# Patient Record
Sex: Male | Born: 1965 | Race: Black or African American | Hispanic: No | Marital: Married | Smoking: Never smoker
Health system: Southern US, Community
[De-identification: ages and names within clinical notes are randomized; demographics above are authoritative.]

## PROBLEM LIST (undated history)

## (undated) DIAGNOSIS — I10 Essential (primary) hypertension: Secondary | ICD-10-CM

## (undated) DIAGNOSIS — G510 Bell's palsy: Secondary | ICD-10-CM

## (undated) DIAGNOSIS — E785 Hyperlipidemia, unspecified: Secondary | ICD-10-CM

## (undated) DIAGNOSIS — T7840XA Allergy, unspecified, initial encounter: Secondary | ICD-10-CM

## (undated) DIAGNOSIS — G4730 Sleep apnea, unspecified: Secondary | ICD-10-CM

## (undated) HISTORY — DX: Sleep apnea, unspecified: G47.30

## (undated) HISTORY — PX: POLYPECTOMY: SHX149

## (undated) HISTORY — DX: Allergy, unspecified, initial encounter: T78.40XA

## (undated) HISTORY — PX: COLONOSCOPY: SHX174

## (undated) HISTORY — DX: Bell's palsy: G51.0

## (undated) HISTORY — DX: Hyperlipidemia, unspecified: E78.5

## (undated) HISTORY — DX: Essential (primary) hypertension: I10

## (undated) HISTORY — PX: KNEE SURGERY: SHX244

---

## 1998-05-12 ENCOUNTER — Emergency Department (HOSPITAL_COMMUNITY): Admission: EM | Admit: 1998-05-12 | Discharge: 1998-05-12 | Payer: Self-pay | Admitting: Emergency Medicine

## 1999-08-13 ENCOUNTER — Emergency Department (HOSPITAL_COMMUNITY): Admission: EM | Admit: 1999-08-13 | Discharge: 1999-08-13 | Payer: Self-pay | Admitting: Emergency Medicine

## 1999-08-13 ENCOUNTER — Encounter: Payer: Self-pay | Admitting: Emergency Medicine

## 2004-10-27 ENCOUNTER — Emergency Department (HOSPITAL_COMMUNITY): Admission: EM | Admit: 2004-10-27 | Discharge: 2004-10-27 | Payer: Self-pay | Admitting: Family Medicine

## 2005-06-14 ENCOUNTER — Encounter: Admission: RE | Admit: 2005-06-14 | Discharge: 2005-06-14 | Payer: Self-pay | Admitting: Family Medicine

## 2006-07-11 ENCOUNTER — Ambulatory Visit: Payer: Self-pay | Admitting: Family Medicine

## 2006-07-30 ENCOUNTER — Ambulatory Visit: Payer: Self-pay | Admitting: Family Medicine

## 2006-08-15 ENCOUNTER — Ambulatory Visit: Payer: Self-pay | Admitting: Family Medicine

## 2006-08-15 LAB — CONVERTED CEMR LAB
ALT: 21 units/L (ref 0–40)
AST: 24 units/L (ref 0–37)
Albumin: 3.9 g/dL (ref 3.5–5.2)
Alkaline Phosphatase: 51 units/L (ref 39–117)
BUN: 16 mg/dL (ref 6–23)
Basophils Absolute: 0 10*3/uL (ref 0.0–0.1)
Basophils Relative: 1 % (ref 0.0–1.0)
CO2: 32 meq/L (ref 19–32)
Calcium: 9.2 mg/dL (ref 8.4–10.5)
Chloride: 106 meq/L (ref 96–112)
Chol/HDL Ratio, serum: 4.1
Cholesterol: 168 mg/dL (ref 0–200)
Creatinine, Ser: 1.4 mg/dL (ref 0.4–1.5)
Eosinophil percent: 5 % (ref 0.0–5.0)
GFR calc non Af Amer: 59 mL/min
Glomerular Filtration Rate, Af Am: 72 mL/min/{1.73_m2}
Glucose, Bld: 90 mg/dL (ref 70–99)
HCT: 39 % (ref 39.0–52.0)
HDL: 41.2 mg/dL (ref >39.0)
Hemoglobin: 13.2 g/dL (ref 13.0–17.0)
LDL Cholesterol: 115 mg/dL — ABNORMAL HIGH (ref 0–99)
Lymphocytes Relative: 40.9 % (ref 12.0–46.0)
MCHC: 33.8 g/dL (ref 30.0–36.0)
MCV: 90.6 fL (ref 78.0–100.0)
Monocytes Absolute: 0.3 10*3/uL (ref 0.2–0.7)
Monocytes Relative: 8.1 % (ref 3.0–11.0)
Neutro Abs: 1.8 10*3/uL (ref 1.4–7.7)
Neutrophils Relative %: 45 % (ref 43.0–77.0)
PSA: 1.33 ng/mL (ref 0.10–4.00)
Platelets: 244 10*3/uL (ref 150–400)
Potassium: 3.9 meq/L (ref 3.5–5.1)
RBC: 4.31 M/uL (ref 4.22–5.81)
RDW: 12.4 % (ref 11.5–14.6)
Sodium: 142 meq/L (ref 135–145)
TSH: 1.32 microintl units/mL (ref 0.35–5.50)
Total Bilirubin: 0.8 mg/dL (ref 0.3–1.2)
Total Protein: 7.5 g/dL (ref 6.0–8.3)
Triglyceride fasting, serum: 59 mg/dL (ref 0–149)
VLDL: 12 mg/dL (ref 0–40)
WBC: 3.9 10*3/uL — ABNORMAL LOW (ref 4.5–10.5)

## 2006-11-12 ENCOUNTER — Ambulatory Visit: Payer: Self-pay | Admitting: Family Medicine

## 2006-11-12 LAB — CONVERTED CEMR LAB
ALT: 22 units/L (ref 0–40)
AST: 39 units/L — ABNORMAL HIGH (ref 0–37)
Albumin: 4.1 g/dL (ref 3.5–5.2)
Alkaline Phosphatase: 61 units/L (ref 39–117)
BUN: 14 mg/dL (ref 6–23)
Basophils Absolute: 0 10*3/uL (ref 0.0–0.1)
Basophils Relative: 0.6 % (ref 0.0–1.0)
Bilirubin, Direct: 0.1 mg/dL (ref 0.0–0.3)
CO2: 31 meq/L (ref 19–32)
Calcium: 9.4 mg/dL (ref 8.4–10.5)
Chloride: 106 meq/L (ref 96–112)
Cholesterol: 165 mg/dL (ref 0–200)
Creatinine, Ser: 1.3 mg/dL (ref 0.4–1.5)
Eosinophils Absolute: 0.2 10*3/uL (ref 0.0–0.6)
Eosinophils Relative: 5.9 % — ABNORMAL HIGH (ref 0.0–5.0)
GFR calc Af Amer: 78 mL/min
GFR calc non Af Amer: 65 mL/min
Glucose, Bld: 84 mg/dL (ref 70–99)
HCT: 38 % — ABNORMAL LOW (ref 39.0–52.0)
HDL: 45.4 mg/dL (ref >39.0)
Hemoglobin: 13.4 g/dL (ref 13.0–17.0)
LDL Cholesterol: 106 mg/dL — ABNORMAL HIGH (ref 0–99)
Lymphocytes Relative: 33.9 % (ref 12.0–46.0)
MCHC: 35.3 g/dL (ref 30.0–36.0)
MCV: 88.8 fL (ref 78.0–100.0)
Monocytes Absolute: 0.4 10*3/uL (ref 0.2–0.7)
Monocytes Relative: 9.4 % (ref 3.0–11.0)
Neutro Abs: 2 10*3/uL (ref 1.4–7.7)
Neutrophils Relative %: 50.2 % (ref 43.0–77.0)
PSA: 1.46 ng/mL (ref 0.10–4.00)
Platelets: 238 10*3/uL (ref 150–400)
Potassium: 3.7 meq/L (ref 3.5–5.1)
RBC: 4.29 M/uL (ref 4.22–5.81)
RDW: 12.1 % (ref 11.5–14.6)
Sodium: 142 meq/L (ref 135–145)
TSH: 1.08 microintl units/mL (ref 0.35–5.50)
Total Bilirubin: 0.9 mg/dL (ref 0.3–1.2)
Total CHOL/HDL Ratio: 3.6
Total Protein: 7.8 g/dL (ref 6.0–8.3)
Triglycerides: 67 mg/dL (ref 0–149)
VLDL: 13 mg/dL (ref 0–40)
WBC: 3.9 10*3/uL — ABNORMAL LOW (ref 4.5–10.5)

## 2007-02-25 DIAGNOSIS — I10 Essential (primary) hypertension: Secondary | ICD-10-CM | POA: Insufficient documentation

## 2007-06-18 ENCOUNTER — Ambulatory Visit: Payer: Self-pay | Admitting: Family Medicine

## 2007-06-18 DIAGNOSIS — L0390 Cellulitis, unspecified: Secondary | ICD-10-CM

## 2007-06-18 DIAGNOSIS — L0291 Cutaneous abscess, unspecified: Secondary | ICD-10-CM

## 2007-06-23 ENCOUNTER — Ambulatory Visit: Payer: Self-pay | Admitting: Family Medicine

## 2007-09-17 ENCOUNTER — Ambulatory Visit: Payer: Self-pay | Admitting: Family Medicine

## 2007-11-07 ENCOUNTER — Ambulatory Visit: Payer: Self-pay | Admitting: Family Medicine

## 2007-11-18 LAB — CONVERTED CEMR LAB
ALT: 20 units/L (ref 0–53)
AST: 24 units/L (ref 0–37)
Albumin: 4.1 g/dL (ref 3.5–5.2)
Alkaline Phosphatase: 59 units/L (ref 39–117)
BUN: 13 mg/dL (ref 6–23)
Bilirubin, Direct: 0.1 mg/dL (ref 0.0–0.3)
CO2: 31 meq/L (ref 19–32)
Calcium: 9.3 mg/dL (ref 8.4–10.5)
Chloride: 104 meq/L (ref 96–112)
Cholesterol: 172 mg/dL (ref 0–200)
Creatinine, Ser: 1.1 mg/dL (ref 0.4–1.5)
GFR calc Af Amer: 94 mL/min
GFR calc non Af Amer: 78 mL/min
Glucose, Bld: 92 mg/dL (ref 70–99)
HDL: 41.3 mg/dL (ref >39.0)
LDL Cholesterol: 119 mg/dL — ABNORMAL HIGH (ref 0–99)
Potassium: 3.8 meq/L (ref 3.5–5.1)
Sodium: 141 meq/L (ref 135–145)
Total Bilirubin: 0.8 mg/dL (ref 0.3–1.2)
Total CHOL/HDL Ratio: 4.2
Total Protein: 7.8 g/dL (ref 6.0–8.3)
Triglycerides: 58 mg/dL (ref 0–149)
VLDL: 12 mg/dL (ref 0–40)

## 2008-02-16 ENCOUNTER — Ambulatory Visit: Payer: Self-pay | Admitting: Family Medicine

## 2008-02-23 ENCOUNTER — Encounter (INDEPENDENT_AMBULATORY_CARE_PROVIDER_SITE_OTHER): Payer: Self-pay | Admitting: *Deleted

## 2008-03-22 ENCOUNTER — Ambulatory Visit: Payer: Self-pay | Admitting: Family Medicine

## 2008-03-22 ENCOUNTER — Encounter (INDEPENDENT_AMBULATORY_CARE_PROVIDER_SITE_OTHER): Payer: Self-pay | Admitting: *Deleted

## 2008-03-22 DIAGNOSIS — E785 Hyperlipidemia, unspecified: Secondary | ICD-10-CM

## 2008-06-15 ENCOUNTER — Telehealth (INDEPENDENT_AMBULATORY_CARE_PROVIDER_SITE_OTHER): Payer: Self-pay | Admitting: *Deleted

## 2008-06-22 ENCOUNTER — Telehealth (INDEPENDENT_AMBULATORY_CARE_PROVIDER_SITE_OTHER): Payer: Self-pay | Admitting: *Deleted

## 2008-06-22 ENCOUNTER — Ambulatory Visit: Payer: Self-pay | Admitting: Family Medicine

## 2008-06-22 DIAGNOSIS — L03319 Cellulitis of trunk, unspecified: Secondary | ICD-10-CM

## 2008-06-22 DIAGNOSIS — L02219 Cutaneous abscess of trunk, unspecified: Secondary | ICD-10-CM

## 2008-06-25 ENCOUNTER — Telehealth: Payer: Self-pay | Admitting: Family Medicine

## 2008-06-28 ENCOUNTER — Ambulatory Visit: Payer: Self-pay | Admitting: Family Medicine

## 2008-06-28 ENCOUNTER — Encounter: Payer: Self-pay | Admitting: Family Medicine

## 2008-06-29 ENCOUNTER — Telehealth: Payer: Self-pay | Admitting: Family Medicine

## 2008-06-29 ENCOUNTER — Encounter: Payer: Self-pay | Admitting: Family Medicine

## 2008-06-30 ENCOUNTER — Ambulatory Visit: Payer: Self-pay | Admitting: Family Medicine

## 2008-07-05 ENCOUNTER — Ambulatory Visit: Payer: Self-pay | Admitting: Family Medicine

## 2008-07-09 ENCOUNTER — Ambulatory Visit: Payer: Self-pay | Admitting: Family Medicine

## 2008-07-12 ENCOUNTER — Telehealth: Payer: Self-pay | Admitting: Family Medicine

## 2008-09-29 ENCOUNTER — Telehealth: Payer: Self-pay | Admitting: Internal Medicine

## 2009-02-15 ENCOUNTER — Telehealth: Payer: Self-pay | Admitting: Family Medicine

## 2009-06-27 ENCOUNTER — Ambulatory Visit: Payer: Self-pay | Admitting: Family Medicine

## 2009-07-04 ENCOUNTER — Encounter: Payer: Self-pay | Admitting: Family Medicine

## 2009-07-19 ENCOUNTER — Ambulatory Visit: Payer: Self-pay | Admitting: Family Medicine

## 2009-07-19 LAB — CONVERTED CEMR LAB
OCCULT 1: NEGATIVE
OCCULT 2: NEGATIVE
OCCULT 3: NEGATIVE

## 2009-10-22 ENCOUNTER — Encounter: Payer: Self-pay | Admitting: Family Medicine

## 2009-10-25 ENCOUNTER — Ambulatory Visit: Payer: Self-pay | Admitting: Family Medicine

## 2009-10-25 DIAGNOSIS — M25519 Pain in unspecified shoulder: Secondary | ICD-10-CM | POA: Insufficient documentation

## 2009-11-08 ENCOUNTER — Encounter: Admission: RE | Admit: 2009-11-08 | Discharge: 2009-11-29 | Payer: Self-pay | Admitting: Family Medicine

## 2009-11-11 ENCOUNTER — Encounter: Payer: Self-pay | Admitting: Family Medicine

## 2009-12-05 ENCOUNTER — Ambulatory Visit: Payer: Self-pay | Admitting: Family Medicine

## 2009-12-05 DIAGNOSIS — M25569 Pain in unspecified knee: Secondary | ICD-10-CM

## 2009-12-05 DIAGNOSIS — M25561 Pain in right knee: Secondary | ICD-10-CM | POA: Insufficient documentation

## 2010-01-02 ENCOUNTER — Ambulatory Visit: Payer: Self-pay | Admitting: Family Medicine

## 2010-02-16 ENCOUNTER — Ambulatory Visit (HOSPITAL_BASED_OUTPATIENT_CLINIC_OR_DEPARTMENT_OTHER): Admission: RE | Admit: 2010-02-16 | Discharge: 2010-02-16 | Payer: Self-pay | Admitting: Orthopedic Surgery

## 2010-02-21 ENCOUNTER — Telehealth: Payer: Self-pay | Admitting: Family Medicine

## 2010-02-24 ENCOUNTER — Telehealth (INDEPENDENT_AMBULATORY_CARE_PROVIDER_SITE_OTHER): Payer: Self-pay | Admitting: *Deleted

## 2010-03-13 ENCOUNTER — Ambulatory Visit: Payer: Self-pay | Admitting: Family Medicine

## 2010-03-14 LAB — CONVERTED CEMR LAB
ALT: 33 units/L (ref 0–53)
AST: 29 units/L (ref 0–37)
Albumin: 4.3 g/dL (ref 3.5–5.2)
Alkaline Phosphatase: 68 units/L (ref 39–117)
HDL: 48.3 mg/dL (ref >39.00)
Total Bilirubin: 0.5 mg/dL (ref 0.3–1.2)
Total CHOL/HDL Ratio: 3
Triglycerides: 75 mg/dL (ref 0.0–149.0)

## 2010-08-01 ENCOUNTER — Telehealth: Payer: Self-pay | Admitting: Family Medicine

## 2010-09-04 ENCOUNTER — Ambulatory Visit: Payer: Self-pay | Admitting: Family Medicine

## 2010-09-04 ENCOUNTER — Telehealth (INDEPENDENT_AMBULATORY_CARE_PROVIDER_SITE_OTHER): Payer: Self-pay | Admitting: *Deleted

## 2010-09-04 DIAGNOSIS — J019 Acute sinusitis, unspecified: Secondary | ICD-10-CM

## 2010-09-05 ENCOUNTER — Ambulatory Visit: Payer: Self-pay | Admitting: Family Medicine

## 2010-09-06 LAB — CONVERTED CEMR LAB
ALT: 21 units/L (ref 0–53)
AST: 23 units/L (ref 0–37)
Albumin: 4.2 g/dL (ref 3.5–5.2)
CO2: 31 meq/L (ref 19–32)
Calcium: 9.3 mg/dL (ref 8.4–10.5)
GFR calc non Af Amer: 86.4 mL/min (ref >60)
LDL Cholesterol: 83 mg/dL (ref 0–99)
Sodium: 138 meq/L (ref 135–145)
Total Protein: 7.7 g/dL (ref 6.0–8.3)
VLDL: 12.8 mg/dL (ref 0.0–40.0)

## 2010-11-05 LAB — CONVERTED CEMR LAB
ALT: 19 units/L (ref 0–53)
ALT: 22 units/L (ref 0–53)
ALT: 22 units/L (ref 0–53)
AST: 22 units/L (ref 0–37)
AST: 26 units/L (ref 0–37)
AST: 27 units/L (ref 0–37)
Albumin: 4.1 g/dL (ref 3.5–5.2)
Albumin: 4.1 g/dL (ref 3.5–5.2)
Albumin: 4.2 g/dL (ref 3.5–5.2)
Alkaline Phosphatase: 53 units/L (ref 39–117)
Alkaline Phosphatase: 62 units/L (ref 39–117)
Alkaline Phosphatase: 70 units/L (ref 39–117)
BUN: 13 mg/dL (ref 6–23)
BUN: 13 mg/dL (ref 6–23)
BUN: 14 mg/dL (ref 6–23)
Basophils Absolute: 0 10*3/uL (ref 0.0–0.1)
Basophils Absolute: 0 10*3/uL (ref 0.0–0.1)
Basophils Relative: 0.6 % (ref 0.0–3.0)
Basophils Relative: 1.3 % — ABNORMAL HIGH (ref 0.0–1.0)
Bilirubin Urine: NEGATIVE
Bilirubin, Direct: 0 mg/dL (ref 0.0–0.3)
Bilirubin, Direct: 0.1 mg/dL (ref 0.0–0.3)
Bilirubin, Direct: 0.1 mg/dL (ref 0.0–0.3)
Blood in Urine, dipstick: NEGATIVE
CO2: 31 meq/L (ref 19–32)
CO2: 32 meq/L (ref 19–32)
CO2: 32 meq/L (ref 19–32)
CRP, High Sensitivity: <1 — ABNORMAL LOW (ref 0.00–5.00)
Calcium: 9.1 mg/dL (ref 8.4–10.5)
Calcium: 9.2 mg/dL (ref 8.4–10.5)
Calcium: 9.3 mg/dL (ref 8.4–10.5)
Chloride: 105 meq/L (ref 96–112)
Chloride: 105 meq/L (ref 96–112)
Chloride: 108 meq/L (ref 96–112)
Cholesterol: 122 mg/dL (ref 0–200)
Cholesterol: 137 mg/dL (ref 0–200)
Creatinine, Ser: 1.2 mg/dL (ref 0.4–1.5)
Creatinine, Ser: 1.2 mg/dL (ref 0.4–1.5)
Creatinine, Ser: 1.2 mg/dL (ref 0.4–1.5)
Eosinophils Absolute: 0.2 10*3/uL (ref 0.0–0.7)
Eosinophils Absolute: 0.2 10*3/uL (ref 0.0–0.7)
Eosinophils Relative: 5.6 % — ABNORMAL HIGH (ref 0.0–5.0)
Eosinophils Relative: 7.1 % — ABNORMAL HIGH (ref 0.0–5.0)
GFR calc Af Amer: 85 mL/min
GFR calc Af Amer: 85 mL/min
GFR calc non Af Amer: 70 mL/min
GFR calc non Af Amer: 70 mL/min
GFR calc non Af Amer: 84.37 mL/min (ref >60)
Glucose, Bld: 87 mg/dL (ref 70–99)
Glucose, Bld: 91 mg/dL (ref 70–99)
Glucose, Bld: 92 mg/dL (ref 70–99)
Glucose, Urine, Semiquant: NEGATIVE
HCT: 40.9 % (ref 39.0–52.0)
HCT: 42 % (ref 39.0–52.0)
HDL: 40.6 mg/dL (ref >39.0)
HDL: 41.5 mg/dL (ref >39.00)
Hemoglobin: 13.7 g/dL (ref 13.0–17.0)
Hemoglobin: 14.2 g/dL (ref 13.0–17.0)
Ketones, urine, test strip: NEGATIVE
LDL Cholesterol: 68 mg/dL (ref 0–99)
LDL Cholesterol: 82 mg/dL (ref 0–99)
Lymphocytes Relative: 42.9 % (ref 12.0–46.0)
Lymphocytes Relative: 45.6 % (ref 12.0–46.0)
Lymphs Abs: 1.8 10*3/uL (ref 0.7–4.0)
MCHC: 33.6 g/dL (ref 30.0–36.0)
MCHC: 33.9 g/dL (ref 30.0–36.0)
MCV: 90.5 fL (ref 78.0–100.0)
MCV: 91.1 fL (ref 78.0–100.0)
Monocytes Absolute: 0.3 10*3/uL (ref 0.1–1.0)
Monocytes Absolute: 0.4 10*3/uL (ref 0.1–1.0)
Monocytes Relative: 10.1 % (ref 3.0–12.0)
Monocytes Relative: 8.9 % (ref 3.0–12.0)
Neutro Abs: 1.2 10*3/uL — ABNORMAL LOW (ref 1.4–7.7)
Neutro Abs: 1.6 10*3/uL (ref 1.4–7.7)
Neutrophils Relative %: 37.1 % — ABNORMAL LOW (ref 43.0–77.0)
Neutrophils Relative %: 40.8 % — ABNORMAL LOW (ref 43.0–77.0)
Nitrite: NEGATIVE
PSA: 1.44 ng/mL (ref 0.10–4.00)
Platelets: 226 10*3/uL (ref 150–400)
Platelets: 227 10*3/uL (ref 150.0–400.0)
Potassium: 3.8 meq/L (ref 3.5–5.1)
Potassium: 4 meq/L (ref 3.5–5.1)
Potassium: 4.2 meq/L (ref 3.5–5.1)
Protein, U semiquant: NEGATIVE
RBC: 4.49 M/uL (ref 4.22–5.81)
RBC: 4.64 M/uL (ref 4.22–5.81)
RDW: 11.6 % (ref 11.5–14.6)
RDW: 12 % (ref 11.5–14.6)
Sodium: 141 meq/L (ref 135–145)
Sodium: 141 meq/L (ref 135–145)
Sodium: 142 meq/L (ref 135–145)
Specific Gravity, Urine: <1.005
TSH: 0.97 microintl units/mL (ref 0.35–5.50)
TSH: 1.11 microintl units/mL (ref 0.35–5.50)
Total Bilirubin: 0.8 mg/dL (ref 0.3–1.2)
Total Bilirubin: 0.9 mg/dL (ref 0.3–1.2)
Total Bilirubin: 1 mg/dL (ref 0.3–1.2)
Total CHOL/HDL Ratio: 3
Total CHOL/HDL Ratio: 3
Total Protein: 7.9 g/dL (ref 6.0–8.3)
Total Protein: 8.1 g/dL (ref 6.0–8.3)
Total Protein: 8.1 g/dL (ref 6.0–8.3)
Triglycerides: 67 mg/dL (ref 0.0–149.0)
Triglycerides: 67 mg/dL (ref 0–149)
Urobilinogen, UA: NEGATIVE
VLDL: 13 mg/dL (ref 0–40)
VLDL: 13.4 mg/dL (ref 0.0–40.0)
WBC Urine, dipstick: NEGATIVE
WBC: 3.3 10*3/uL — ABNORMAL LOW (ref 4.5–10.5)
WBC: 4 10*3/uL — ABNORMAL LOW (ref 4.5–10.5)
pH: 7

## 2010-11-07 NOTE — Assessment & Plan Note (Signed)
Summary: swelling and pain in right knee/kdc   Vital Signs:  Patient profile:   45 year old male Weight:      214 pounds Pulse rate:   85 / minute Pulse rhythm:   regular BP sitting:   136 / 82  (left arm) Cuff size:   large  Vitals Entered By: Army Fossa CMA (January 02, 2010 10:51 AM) CC: Pt here c/o right knee still bothering him, swelling and pain.   History of Present Illness: Pt here c/o con't knee pain. No swelling but + pain.    Current Medications (verified): 1)  Norvasc 10 Mg Tabs (Amlodipine Besylate) .Marland Kitchen.. 1 Tablet By Mouth Once A Day**office Visit Due Now** 2)  Zocor 40 Mg  Tabs (Simvastatin) .... Take One Tablet Daily Pt Due For Labs 3)  Ultram 50 Mg Tabs (Tramadol Hcl) .Marland Kitchen.. 1-2 By Mouth Every 6 Hours As Needed  Allergies (verified): No Known Drug Allergies  Past History:  Past medical, surgical, family and social histories (including risk factors) reviewed for relevance to current acute and chronic problems.  Past Medical History: Reviewed history from 03/22/2008 and no changes required. Hypertension Hyperlipidemia Current Problems:  HYPERLIPIDEMIA (ICD-272.4) ELEVATED BLOOD PRESSURE WITHOUT DIAGNOSIS OF HYPERTENSION (ICD-796.2) ABSCESS, SKIN (ICD-682.9) HYPERTENSION (ICD-401.9)  Past Surgical History: Reviewed history from 02/25/2007 and no changes required. LEFT KNEE SURGERY  Family History: Reviewed history from 03/22/2008 and no changes required. Family History Hypertension  Social History: Reviewed history from 03/22/2008 and no changes required. Occupation:  credit union-- insurance Married Never Smoked Alcohol use-no Drug use-no Regular exercise-yes  Review of Systems      See HPI  Physical Exam  General:  Well-developed,well-nourished,in no acute distress; alert,appropriate and cooperative throughout examination Msk:  no joint swelling, no joint warmth, no redness over joints, and no joint deformities.  + tenderness ant  patella Extremities:  No clubbing, cyanosis, edema, or deformity noted with normal full range of motion of all joints.   Psych:  Oriented X3 and normally interactive.     Impression & Recommendations:  Problem # 1:  KNEE PAIN, RIGHT (ICD-719.46)  His updated medication list for this problem includes:    Ultram 50 Mg Tabs (Tramadol hcl) .Marland Kitchen... 1-2 by mouth every 6 hours as needed  Discussed strengthening exercises, use of ice or heat, and medications.   Orders: Orthopedic Referral (Ortho)  Complete Medication List: 1)  Norvasc 10 Mg Tabs (Amlodipine besylate) .Marland Kitchen.. 1 tablet by mouth once a day**office visit due now** 2)  Zocor 40 Mg Tabs (Simvastatin) .... Take one tablet daily pt due for labs 3)  Ultram 50 Mg Tabs (Tramadol hcl) .Marland Kitchen.. 1-2 by mouth every 6 hours as needed

## 2010-11-07 NOTE — Assessment & Plan Note (Signed)
Summary: nasal discharge, head congestion//fd   Vital Signs:  Patient profile:   45 year old male Height:      71 inches Weight:      221.8 pounds BMI:     31.05 O2 Sat:      97 % on Room air Temp:     99.3 degrees F oral Pulse rate:   81 / minute Pulse rhythm:   regular BP sitting:   140 / 70  (left arm) Cuff size:   large  Vitals Entered By: Almeta Monas CMA Duncan Dull) (September 04, 2010 3:11 PM)  O2 Flow:  Room air CC: x2 weeks c/o runny nose, cough, yellow sputum, congestion-- needs meds refilled, URI symptoms   History of Present Illness:       This is a 45 year old man who presents with URI symptoms.  The symptoms began 2 weeks ago.  Pt was sick for 2 weeks and it has gotten better but will  not go away completely.  The patient complains of nasal congestion and sore throat, but denies clear nasal discharge, purulent nasal discharge, dry cough, productive cough, earache, and sick contacts.  The patient denies fever, low-grade fever (<100.5 degrees), fever of 100.5-103 degrees, fever of 103.1-104 degrees, fever to >104 degrees, stiff neck, dyspnea, wheezing, rash, vomiting, diarrhea, use of an antipyretic, and response to antipyretic.  The patient also reports itchy throat.  The patient denies the following risk factors for Strep sinusitis: unilateral facial pain, unilateral nasal discharge, poor response to decongestant, double sickening, tooth pain, Strep exposure, tender adenopathy, and absence of cough.    Current Medications (verified): 1)  Norvasc 10 Mg Tabs (Amlodipine Besylate) .Marland Kitchen.. 1 Tablet By Mouth Once A Day 2)  Zocor 40 Mg  Tabs (Simvastatin) .... Take One Tablet At Bedtime 3)  Ultram 50 Mg Tabs (Tramadol Hcl) .Marland Kitchen.. 1-2 By Mouth Every 6 Hours As Needed 4)  Ceftin 500 Mg Tabs (Cefuroxime Axetil) .Marland Kitchen.. 1 By Mouth Two Times A Day 5)  Flonase 50 Mcg/act Susp (Fluticasone Propionate) .... 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 Family History Hypertension  Social History: Reviewed history from 03/22/2008 and no changes required. 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  Otoscopic examination reveals clear canals, tympanic membranes are intact bilaterally without bulging, retraction, inflammation or discharge. Hearing is grossly normal bilaterally. Nose:  external deformity, external erythema, L frontal sinus tenderness, L maxillary sinus tenderness, and R frontal sinus tenderness.   Mouth:  pharyngeal erythema.   Neck:  supple and cervical lymphadenopathy.   Lungs:  Normal respiratory effort, chest  expands symmetrically. Lungs are clear to auscultation, no crackles or wheezes. Heart:  normal rate and no murmur.   Extremities:  No clubbing, cyanosis, edema, or deformity noted with normal full  range of motion of all joints.   Skin:  Intact without suspicious lesions or rashes Psych:  Cognition and judgment appear intact. Alert and cooperative with normal attention span and concentration. No apparent delusions, illusions, hallucinations   Impression & Recommendations:  Problem # 1:  SINUSITIS - ACUTE-NOS (ICD-461.9)  His updated medication list for this problem includes:    Ceftin 500 Mg Tabs (Cefuroxime axetil) .Marland Kitchen... 1 by mouth two times a day    Flonase 50 Mcg/act Susp (Fluticasone propionate) .Marland Kitchen... 2 sprays each nostril once daily  Instructed on treatment. Call if symptoms persist or worsen.   Problem # 2:  HYPERLIPIDEMIA (ICD-272.4)  His updated medication list for this problem includes:    Zocor 40 Mg Tabs (Simvastatin) .Marland Kitchen... Take one tablet at bedtime  Labs Reviewed: SGOT: 29 (03/13/2010)   SGPT: 33 (03/13/2010)  Prior 10 Yr Risk Heart Disease: 6 % (06/18/2007)   HDL:48.30 (03/13/2010), 41.50 (06/27/2009)  LDL:87 (03/13/2010), 82 (06/27/2009)  Chol:150 (03/13/2010), 137 (06/27/2009)  Trig:75.0 (03/13/2010), 67.0 (06/27/2009)  Problem # 3:  HYPERTENSION (ICD-401.9)  His updated medication list for this problem includes:    Norvasc 10 Mg Tabs (Amlodipine besylate) .Marland Kitchen... 1 tablet by mouth once a day  BP today: 140/70 Prior BP: 136/82 (01/02/2010)  Prior 10 Yr Risk Heart Disease: 6 % (06/18/2007)  Labs Reviewed: K+: 4.0 (06/27/2009) Creat: : 1.2 (06/27/2009)   Chol: 150 (03/13/2010)   HDL: 48.30 (03/13/2010)   LDL: 87 (03/13/2010)   TG: 75.0 (03/13/2010)  Complete Medication List: 1)  Norvasc 10 Mg Tabs (Amlodipine besylate) .Marland Kitchen.. 1 tablet by mouth once a day 2)  Zocor 40 Mg Tabs (Simvastatin) .... Take one tablet at bedtime 3)  Ultram 50 Mg Tabs (Tramadol hcl) .Marland Kitchen.. 1-2 by mouth every 6 hours as needed 4)  Ceftin 500 Mg Tabs (Cefuroxime axetil) .Marland Kitchen.. 1 by mouth two times a day 5)  Flonase 50 Mcg/act Susp (Fluticasone propionate) .... 2 sprays each nostril once  daily  Patient Instructions: 1)  fasting labs ---lipid, hep, bmp  401.9  272.4 2)  Please schedule a follow-up appointment in 6 months .  Prescriptions: FLONASE 50 MCG/ACT SUSP (FLUTICASONE PROPIONATE) 2 sprays each nostril once daily  #1 x 1   Entered and Authorized by:   Loreen Freud DO   Signed by:   Loreen Freud DO on 09/04/2010   Method used:   Electronically to        [facility] [phone]* (retail)       [address]       [place], [place]  [address]       Ph: [phone]       Fax: [phone]   RxID:   [id number] CEFTIN 500 MG TABS (CEFUROXIME AXETIL) 1 by mouth two times a day  #20 x 0   Entered and Authorized by:   Loreen Freud DO   Signed by:   Loreen Freud DO on 09/04/2010   Method used:   Electronically to        [facility] [phone]* (retail)       [address]       [place], [place]  [address]       Ph: [phone]       Fax: [phone]   RxID:  [id number] ZOCOR 40 MG  TABS (SIMVASTATIN) Take one tablet at bedtime  #30 x 5   Entered and Authorized by:   Loreen Freud DO   Signed by:   Loreen Freud DO on 09/04/2010   Method used:   Electronically to        [facility] [phone]* (retail)       [address]       [place], [place]  [address]       Ph: [phone]       Fax: [phone]   RxID:   [id number] NORVASC 10 MG TABS (AMLODIPINE BESYLATE) 1 tablet by mouth once a day  #30 x 5   Entered and Authorized by:   Loreen Freud DO   Signed by:   Loreen Freud DO on 09/04/2010   Method used:   Electronically to        [facility] [phone]* (retail)       [address]       [place], [place]  [address]       Ph: [phone]       Fax: [phone]   RxID:   [id number]    Orders Added: 1)  Est. Patient Level IV [[address]]

## 2010-11-07 NOTE — Letter (Signed)
Summary: Call a Nurse  Call a Nurse   Imported By: Lanelle Bal 10/28/2009 13:48:43  _____________________________________________________________________  External Attachment:    Type:   Image     Comment:   External Document

## 2010-11-07 NOTE — Progress Notes (Signed)
Summary: reaction to med  Phone Note Call from Patient Call back at [phone]   Caller: Patient Summary of Call: Pt left VM that  he is having reaction to med and would like call back to discuss...........Marland KitchenFelecia Deloach CMA  Feb 21, 2010 11:07 AM   pt states that he just had knee surgery and was rx cephalexin 500 mg 1 by mouth qid. pt states that med caused swelling in top and bottom lip,  hiccups,  and nausea.Pt advise to contact prescribing doctor about reaction. Pt states that he call on call service this pass weekend and has yet to hear anything. Pt states that he has completed course of med but would like to know what to do about swelling. Pt advise to try benadryl to see if it will help. PT denies any SOB or difficulty breathing. pt offer appt for today and tomorrow pt decline stating he has no way unable to drive. Pls advise................Marland KitchenFelecia Deloach CMA  Feb 21, 2010 11:21 AM   Follow-up for Phone Call        benadryl is correct-- if and sob or cp or difficulty swallowing he must go to ER---he should call surgeon as well Follow-up by: Loreen Freud DO,  Feb 21, 2010 12:07 PM  Additional Follow-up for Phone Call Additional follow up Details #1::        pt aware..............Marland KitchenFelecia Deloach CMA  Feb 21, 2010 12:45 PM

## 2010-11-07 NOTE — Miscellaneous (Signed)
Summary: PT Initial Summary/[facility]  PT Initial Summary/[facility]   Imported By: Lanelle Bal 11/16/2009 13:51:56  _____________________________________________________________________  External Attachment:    Type:   Image     Comment:   External Document

## 2010-11-07 NOTE — Progress Notes (Signed)
Summary: med cahnge/med reaction  Phone Note Call from Patient Call back at Home Phone [phone]   Caller: Patient Summary of Call: Pt states that he has heard and read that some BP med cause erectile dysfunction and can affect sex drive. Pt would like to know if it would be possible for his med to be change to a med that does not have this side effect. pt currently taking NORVASC 10 MG TABS  1 tab qd.Pt uses [facility] [address]..Pls advise..........Marland KitchenFelecia Deloach CMA  August 01, 2010 12:47 PM   Follow-up for Phone Call        they actually can all cause it ---some more than others and the one he is on is less likely to cause a problem than a lot of the others Follow-up by: Loreen Freud DO,  August 01, 2010 1:52 PM  Additional Follow-up for Phone Call Additional follow up Details #1::        Patient notified and does not agree. He requests alternative. Additional Follow-up by: Lucious Groves CMA,  August 01, 2010 4:00 PM    Additional Follow-up for Phone Call Additional follow up Details #2::    pt needs ov to discuss and change med--last ov March Follow-up by: Loreen Freud DO,  August 01, 2010 5:08 PM  Additional Follow-up for Phone Call Additional follow up Details #3:: Details for Additional Follow-up Action Taken: discuss with patient, appt scheduled..............Marland KitchenFelecia Deloach CMA  August 02, 2010 9:08 AM

## 2010-11-07 NOTE — Assessment & Plan Note (Signed)
Summary: R knee pain//lch   Vital Signs:  Patient profile:   45 year old male Weight:      211 pounds Temp:     98.3 degrees F oral Pulse rate:   85 / minute Pulse rhythm:   regular BP sitting:   122 / 80  (left arm) Cuff size:   large  Vitals Entered By: Army Fossa CMA (December 05, 2009 8:28 AM) CC: Pt c/o right knee pain x 1 week. Swollen.    History of Present Illness: Pt here c/o swelling R knee x 1 1/2 weeks. Pt thinks he twisted it but is really not sure.  No other complaints.    Current Medications (verified): 1)  Norvasc 10 Mg Tabs (Amlodipine Besylate) .Marland Kitchen.. 1 Tablet By Mouth Once A Day**office Visit Due Now** 2)  Zocor 40 Mg  Tabs (Simvastatin) .... Take One Tablet Daily Pt Due For Labs 3)  Ultram 50 Mg Tabs (Tramadol Hcl) .Marland Kitchen.. 1-2 By Mouth Every 6 Hours As Needed  Allergies (verified): No Known Drug Allergies  Past History:  Past medical, surgical, family and social histories (including risk factors) reviewed for relevance to current acute and chronic problems.  Past Medical History: Reviewed history from 03/22/2008 and no changes required. Hypertension Hyperlipidemia Current Problems:  HYPERLIPIDEMIA (ICD-272.4) ELEVATED BLOOD PRESSURE WITHOUT DIAGNOSIS OF HYPERTENSION (ICD-796.2) ABSCESS, SKIN (ICD-682.9) HYPERTENSION (ICD-401.9)  Past Surgical History: Reviewed history from 02/25/2007 and no changes required. LEFT KNEE SURGERY  Family History: Reviewed history from 03/22/2008 and no changes required. Family History Hypertension  Social History: Reviewed history from 03/22/2008 and no changes required. 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   Psych:  Oriented X3 and normally interactive.     Impression & Recommendations:  Problem # 1:  KNEE PAIN, RIGHT (ICD-719.46)  His updated medication list for this problem includes:    Ultram 50 Mg Tabs (Tramadol hcl) .Marland Kitchen... 1-2 by mouth every 6 hours as needed  Discussed strengthening exercises, use of ice or heat, and medications.   Orders: Knee Orthosis Elastic Knee Cap [id number])  Complete Medication List: 1)  Norvasc 10 Mg Tabs (Amlodipine besylate) .Marland Kitchen.. 1 tablet by mouth once a day**office visit due now** 2)  Zocor 40 Mg Tabs (Simvastatin) .... Take one tablet daily pt due for labs 3)  Ultram 50 Mg Tabs (Tramadol hcl) .Marland Kitchen.. 1-2 by mouth every 6 hours as needed

## 2010-11-07 NOTE — Progress Notes (Signed)
Summary: Lab appt scheduled [id number]  Phone Note Outgoing Call   Call placed by: Army Fossa CMA,  Feb 24, 2010 11:15 AM Reason for Call: Discuss lab or test results Summary of Call: Need lab appt:  272.4  lipid, hep  Follow-up for Phone Call        patient cant drive for 2 weeks -- lab scheduled [id number] Follow-up by: Okey Regal [place],  Feb 24, 2010 1:36 PM

## 2010-11-07 NOTE — Progress Notes (Signed)
Summary: call-a-nurse  Phone Note Outgoing Call   Details for Reason: Call-A-Nurse Triage Call Report Triage Record Num: [id number] Operator: Jeraldine Loots Patient Name: Bobby Castaneda Call Date & Time: 09/01/2010 2:41:24PM Patient Phone: PCP: Lelon Perla Patient Gender: Male PCP Fax : [phone] Patient DOB: 02-05-1966 Practice Name: [facility] Reason for Call: Pt calling, has alot of nasal discharge for a week. No fever. No headache. No pressure around his eyes. Occasionally has sputum that is brown in color but most of the time is clear. Home care given. Protocol(s) Used: Upper Respiratory Infection (URI) Recommended Outcome per Protocol: Provide Home/Self Care Reason for Outcome: New onset of two or more of the following symptoms: nasal congestion with runny nose; sneezing; itchy or mild sore throat; mild headache or body aches; mild fatigue; low grade fever up to 101.5 F (38.6C) usually lasting about a week Care Advice:  ~ Use a cool mist humidifier to moisten air. Be sure to clean according to manufacturer's instructions.  ~ Call provider if symptoms worsen or new symptoms develop.  ~ SYMPTOM / CONDITION MANAGEMENT Summary of Call: Pt coming in this afternoon to be seen ..................Marland KitchenFelecia Deloach CMA  September 04, 2010 9:42 AM

## 2010-11-07 NOTE — Assessment & Plan Note (Signed)
Summary: pain in right shoulder/kdc   Vital Signs:  Patient profile:   45 year old male Weight:      217.13 pounds Temp:     98.2 degrees F oral Pulse rate:   82 / minute Pulse rhythm:   regular BP sitting:   124 / 80  (left arm) Cuff size:   large  Vitals Entered By: Army Fossa CMA (October 25, 2009 3:51 PM) CC: Pt c/o right shoulder pain x 2-3 weeks possible pulled muscle.    History of Present Illness:  Injury      This is a 45 year old man who presents with An injury.  The symptoms began 2 weeks ago.  Pt injured R shoulder playing basketball.  He hyperextended his shoulder.  The patient reports injury to the right arm.  The patient also reports tenderness.  The patient denies swelling, redness, increased warmth deformity, blood loss, numbness, weakness, loss of sensation, coolness of extremity, and loss of consciousness.  The patient denies the following risk factors for significant bleeding: aspirin use, anticoagulant use, and history of bleeding disorder.    Current Medications (verified): 1)  Norvasc 10 Mg Tabs (Amlodipine Besylate) .Marland Kitchen.. 1 Tablet By Mouth Once A Day**office Visit Due Now** 2)  Zocor 40 Mg  Tabs (Simvastatin) .... Take One Tablet Daily Pt Due For Labs 3)  Ultram 50 Mg Tabs (Tramadol Hcl) .Marland Kitchen.. 1-2 By Mouth Every 6 Hours As Needed  Allergies (verified): No Known Drug Allergies  Past History:  Past medical, surgical, family and social histories (including risk factors) reviewed for relevance to current acute and chronic problems.  Past Medical History: Reviewed history from 03/22/2008 and no changes required. Hypertension Hyperlipidemia Current Problems:  HYPERLIPIDEMIA (ICD-272.4) ELEVATED BLOOD PRESSURE WITHOUT DIAGNOSIS OF HYPERTENSION (ICD-796.2) ABSCESS, SKIN (ICD-682.9) HYPERTENSION (ICD-401.9)  Past Surgical History: Reviewed history from 02/25/2007 and no changes required. LEFT KNEE SURGERY  Family History: Reviewed history from  03/22/2008 and no changes required. Family History Hypertension  Social History: Reviewed history from 03/22/2008 and no changes required. 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  Pain with resistance  Neurologic:  No cranial nerve deficits noted. Station and gait are normal. Plantar reflexes are down-going bilaterally. DTRs are symmetrical throughout. Sensory, motor and coordinative functions appear intact. Skin:  Intact without suspicious lesions or rashes Psych:  Cognition and judgment appear intact. Alert and cooperative with normal attention span and concentration. No apparent delusions, illusions, hallucinations   Impression & Recommendations:  Problem # 1:  SHOULDER PAIN, RIGHT (ICD-719.41)  His updated medication list for this problem includes:    Ultram 50 Mg Tabs (Tramadol hcl) .Marland Kitchen... 1-2 by mouth every 6 hours as needed  Orders: Physical Therapy Referral (PT)  Discussed shoulder exercises, use of moist heat or ice, and medication.   Complete Medication List: 1)  Norvasc 10 Mg Tabs (Amlodipine besylate) .Marland Kitchen.. 1 tablet by mouth once a day**office visit due now** 2)  Zocor 40 Mg Tabs (Simvastatin) .... Take one tablet daily pt due for labs 3)  Ultram 50 Mg Tabs (Tramadol hcl) .Marland Kitchen.. 1-2 by mouth every 6 hours as needed Prescriptions: ULTRAM 50 MG TABS (TRAMADOL HCL) 1-2  by mouth EVERY 6 HOURS as needed  #30 x 1   Entered and Authorized by:   Loreen Freud DO   Signed by:   Loreen Freud DO on 10/25/2009   Method used:   Print then Give to Patient   RxID:   [id number]

## 2010-12-03 ENCOUNTER — Encounter: Payer: Self-pay | Admitting: Family Medicine

## 2010-12-04 ENCOUNTER — Encounter (INDEPENDENT_AMBULATORY_CARE_PROVIDER_SITE_OTHER): Payer: Self-pay | Admitting: *Deleted

## 2010-12-07 HISTORY — PX: CLOSED REDUCTION HAND FRACTURE: SHX973

## 2010-12-14 NOTE — Miscellaneous (Signed)
Summary: Immunization Entry   Immunization History:  Influenza Immunization History:    Influ[facility]:  [facility] (12/03/2010)

## 2010-12-14 NOTE — Miscellaneous (Signed)
Summary: Flu Shot/[facility]  Flu Shot/[facility]   Imported By: Maryln Gottron 12/07/2010 10:45:01  _____________________________________________________________________  External Attachment:    Type:   Image     Comment:   External Document

## 2010-12-15 ENCOUNTER — Emergency Department (HOSPITAL_COMMUNITY)
Admission: EM | Admit: 2010-12-15 | Discharge: 2010-12-15 | Disposition: A | Discharge status: Home / Self Care | Payer: PRIVATE HEALTH INSURANCE | Attending: Emergency Medicine | Admitting: Emergency Medicine

## 2010-12-15 ENCOUNTER — Telehealth: Payer: Self-pay | Admitting: Family Medicine

## 2010-12-15 ENCOUNTER — Emergency Department (HOSPITAL_COMMUNITY): Payer: PRIVATE HEALTH INSURANCE

## 2010-12-15 DIAGNOSIS — S6990XA Unspecified injury of unspecified wrist, hand and finger(s), initial encounter: Secondary | ICD-10-CM | POA: Insufficient documentation

## 2010-12-15 DIAGNOSIS — S62309A Unspecified fracture of unspecified metacarpal bone, initial encounter for closed fracture: Secondary | ICD-10-CM | POA: Insufficient documentation

## 2010-12-15 DIAGNOSIS — E7800 Pure hypercholesterolemia, unspecified: Secondary | ICD-10-CM | POA: Insufficient documentation

## 2010-12-15 DIAGNOSIS — Y9367 Activity, basketball: Secondary | ICD-10-CM | POA: Insufficient documentation

## 2010-12-15 DIAGNOSIS — W219XXA Striking against or struck by unspecified sports equipment, initial encounter: Secondary | ICD-10-CM | POA: Insufficient documentation

## 2010-12-15 DIAGNOSIS — I10 Essential (primary) hypertension: Secondary | ICD-10-CM | POA: Insufficient documentation

## 2010-12-15 DIAGNOSIS — M79609 Pain in unspecified limb: Secondary | ICD-10-CM | POA: Insufficient documentation

## 2010-12-15 DIAGNOSIS — Y9239 Other specified sports and athletic area as the place of occurrence of the external cause: Secondary | ICD-10-CM | POA: Insufficient documentation

## 2010-12-15 DIAGNOSIS — M7989 Other specified soft tissue disorders: Secondary | ICD-10-CM | POA: Insufficient documentation

## 2010-12-19 NOTE — Progress Notes (Addendum)
Summary: Needs Referral--[place] pt  Phone Note Call from Patient   Caller: Patient Call For: Loreen Freud DO Summary of Call: call from patient and he stated he broke his hand last night playing basketball and needs a referral to  Ortho/or Specialist per the hospital, wants to see if we can refer him so insurance can pay for it  c/b # [phone]...Marland KitchenMarland KitchenPlease advise.... Almeta Monas CMA Duncan Dull)  December 15, 2010 9:06 AM  Hospital report printed.... Initial call taken by: Almeta Monas CMA Duncan Dull),  December 15, 2010 9:06 AM  Follow-up for Phone Call        ok for ortho referral- ask pt if he has a preference. Follow-up by: Neena Rhymes MD,  December 15, 2010 9:45 AM  Additional Follow-up for Phone Call Additional follow up Details #1::        I asked and he said he did not have one.  Additional Follow-up by: Almeta Monas CMA Duncan Dull),  December 15, 2010 9:46 AM     Appended Document: Orders Update    Clinical Lists Changes  Problems: Added new problem of FRACTURE, RIGHT HAND (ICD-815.00) - 4th metacarpal fracture of the right hand Orders: Added new Referral order of Orthopedic Referral (Ortho) - Signed

## 2010-12-26 LAB — POCT I-STAT 4, (NA,K, GLUC, HGB,HCT)
Hemoglobin: 14.6 g/dL (ref 13.0–17.0)
Potassium: 3.4 mEq/L — ABNORMAL LOW (ref 3.5–5.1)
Sodium: 141 mEq/L (ref 135–145)

## 2010-12-28 ENCOUNTER — Encounter: Payer: Self-pay | Admitting: Family Medicine

## 2010-12-29 ENCOUNTER — Other Ambulatory Visit: Payer: Self-pay | Admitting: Family Medicine

## 2010-12-29 ENCOUNTER — Encounter: Payer: Self-pay | Admitting: Family Medicine

## 2010-12-29 ENCOUNTER — Ambulatory Visit (INDEPENDENT_AMBULATORY_CARE_PROVIDER_SITE_OTHER): Payer: PRIVATE HEALTH INSURANCE | Admitting: Family Medicine

## 2010-12-29 VITALS — BP 116/70 | HR 72 | Temp 99.0°F | Wt 221.0 lb

## 2010-12-29 DIAGNOSIS — E049 Nontoxic goiter, unspecified: Secondary | ICD-10-CM | POA: Insufficient documentation

## 2010-12-29 LAB — CBC WITH DIFFERENTIAL/PLATELET
Basophils Absolute: 0 10*3/uL (ref 0.0–0.1)
Basophils Relative: 1 % (ref 0–1)
Eosinophils Absolute: 0.2 10*3/uL (ref 0.0–0.7)
Eosinophils Relative: 5 % (ref 0–5)
HCT: 39.6 % (ref 39.0–52.0)
MCH: 30 pg (ref 26.0–34.0)
MCHC: 34.3 g/dL (ref 30.0–36.0)
MCV: 87.4 fL (ref 78.0–100.0)
Monocytes Absolute: 0.3 10*3/uL (ref 0.1–1.0)
Neutro Abs: 1.6 10*3/uL — ABNORMAL LOW (ref 1.7–7.7)
RDW: 13.1 % (ref 11.5–15.5)

## 2010-12-29 NOTE — Assessment & Plan Note (Signed)
Check thyroid function Check US thyroid Check CBCD rto prn

## 2010-12-29 NOTE — Progress Notes (Signed)
  Subjective:    Patient ID: Bobby Castaneda, male    DOB: November 03, 1965, 45 y.o.   MRN: [id number]  HPI Pt here f/u hand surgery.  Pt was told by surgeon he had an enlarged lymph node.  Pt with no fever, chills or other illness.      Review of Systems  Constitutional: Negative.   HENT: Negative for hearing loss, congestion, facial swelling, neck pain, neck stiffness and ear discharge.   Respiratory: Negative.   Cardiovascular: Negative for chest pain.  Musculoskeletal: Negative for myalgias and arthralgias.  Hematological: Positive for adenopathy.       Objective:   Physical Exam  Constitutional: He appears well-developed and well-nourished.  Neck: Thyromegaly present.  Cardiovascular: Normal rate and regular rhythm.   Pulmonary/Chest: Breath sounds normal. No respiratory distress.  Lymphadenopathy:    He has cervical adenopathy.          Assessment & Plan:

## 2010-12-29 NOTE — Progress Notes (Signed)
Addended by: Floydene Flock on: 12/29/2010 04:15 PM   Modules accepted: Orders

## 2011-01-01 ENCOUNTER — Encounter: Payer: Self-pay | Admitting: *Deleted

## 2011-01-04 ENCOUNTER — Ambulatory Visit
Admission: RE | Admit: 2011-01-04 | Discharge: 2011-01-04 | Disposition: A | Discharge status: Home / Self Care | Payer: PRIVATE HEALTH INSURANCE | Source: Ambulatory Visit | Attending: Family Medicine | Admitting: Family Medicine

## 2011-01-04 DIAGNOSIS — E049 Nontoxic goiter, unspecified: Secondary | ICD-10-CM

## 2011-01-05 ENCOUNTER — Telehealth: Payer: Self-pay | Admitting: *Deleted

## 2011-01-05 MED ORDER — AMOXICILLIN-POT CLAVULANATE 875-125 MG PO TABS: 1.0000 | ORAL_TABLET | Freq: Two times a day (BID) | ORAL | Status: AC

## 2011-01-05 NOTE — Telephone Encounter (Signed)
Discuss with patient, Rx sent to pharmacy. 

## 2011-01-05 NOTE — Progress Notes (Signed)
Left message to call back     KP 

## 2011-01-05 NOTE — Telephone Encounter (Signed)
Message copied by Candie Echevaria on Fri Jan 05, 2011  3:12 PM ------      Message from: Loreen Freud      Created: Thu Jan 04, 2011  4:53 PM       Thyroid normal      + enlarged lymphnodes----  augmentin 875 mg ,  1 po bid for 10 days       If they do not improve we will refer to ENT

## 2011-02-20 NOTE — Assessment & Plan Note (Signed)
[facility]                                 ON-CALL NOTE   BRAX, WALEN                       MRN:          [id number]  DATE:06/14/2008                            DOB:          03/30/1966    DATE OF INTERACTION:  June 14, 2008, at 12:42 p.m.   PHONE NUMBER:  [phone].   SUBJECTIVE:  The patient complains of blood from the ear, has used a Q-  tip recently, now wondering whether he is seeing a little bit of blood,  but also pus, has no overt pain.  Otherwise, has no problems except for  hearing is a little bit depressed.   OBJECTIVE:  Probable scratch of the inner ear canal from a Q-tip.   PLAN:  We would get some peroxide and use a bulb syringe, which he  already has at home.  First of it, flush the bulb with peroxide 2 or 3  times, and then, flush the ear with 3 or 4 bulbs for 3 times today.  Let  the ear dry well, do it again tomorrow, and if things do not improve by  Wednesday, probably would need to be seen to make sure there is no  further problems in the ear by looking in it.   PRIMARY CARE Rucha Wissinger:  Lelon Perla, DO, home office is [place].     Arta Silence, MD  Electronically Signed    RNS/MedQ  DD: 06/14/2008  DT: 06/14/2008  Job #: [id number]

## 2011-03-14 ENCOUNTER — Other Ambulatory Visit: Payer: Self-pay | Admitting: *Deleted

## 2011-03-14 MED ORDER — AMLODIPINE BESYLATE 10 MG PO TABS: 10.0000 mg | ORAL_TABLET | Freq: Every day | ORAL | Status: DC

## 2011-03-14 MED ORDER — SIMVASTATIN 40 MG PO TABS: 40.0000 mg | ORAL_TABLET | Freq: Every day | ORAL | Status: DC

## 2011-04-09 ENCOUNTER — Ambulatory Visit (INDEPENDENT_AMBULATORY_CARE_PROVIDER_SITE_OTHER): Payer: PRIVATE HEALTH INSURANCE | Admitting: Family Medicine

## 2011-04-09 ENCOUNTER — Encounter: Payer: Self-pay | Admitting: Family Medicine

## 2011-04-09 VITALS — BP 126/78 | HR 71 | Temp 98.8°F | Wt 226.2 lb

## 2011-04-09 DIAGNOSIS — J019 Acute sinusitis, unspecified: Secondary | ICD-10-CM

## 2011-04-09 DIAGNOSIS — J329 Chronic sinusitis, unspecified: Secondary | ICD-10-CM

## 2011-04-09 DIAGNOSIS — J029 Acute pharyngitis, unspecified: Secondary | ICD-10-CM

## 2011-04-09 MED ORDER — CEFUROXIME AXETIL 500 MG PO TABS: 500.0000 mg | ORAL_TABLET | Freq: Two times a day (BID) | ORAL | Status: AC

## 2011-04-09 NOTE — Progress Notes (Signed)
  Subjective:     Bobby Castaneda is a 45 y.o. male who presents for evaluation of sinus pain. Symptoms include: congestion, facial pain, nasal congestion, post nasal drip, sinus pressure and sore throat. Onset of symptoms was 3 days ago. Symptoms have been gradually worsening since that time. Past history is significant for no history of pneumonia or bronchitis. Patient is a non-smoker.  The following portions of the patient's history were reviewed and updated as appropriate: allergies, current medications, past family history, past medical history, past social history, past surgical history and problem list.  Review of Systems Pertinent items are noted in HPI.   Objective:    BP 126/78  Pulse 71  Temp(Src) 98.8 F (37.1 C) (Oral)  Wt 226 lb 3.2 oz (102.604 kg)  SpO2 96% General appearance: alert, cooperative, appears stated age and no distress Ears: normal TM's and external ear canals both ears Nose: Nares normal. Septum midline. Mucosa normal. No drainage or sinus tenderness., green discharge, severe congestion, turbinates red, swollen, sinus tenderness bilateral Throat: normal findings: lips normal without lesions and abnormal findings: moderate oropharyngeal erythema Neck: mild anterior cervical adenopathy, supple, symmetrical, trachea midline and thyroid not enlarged, symmetric, no tenderness/mass/nodules Lungs: clear to auscultation bilaterally Heart: regular rate and rhythm, S1, S2 normal, no murmur, click, rub or gallop Skin: Skin color, texture, turgor normal. No rashes or lesions Lymph nodes: Cervical adenopathy: b/l    Assessment:    Acute bacterial sinusitis.    Plan:    Nasal steroids per medication orders. Antihistamines per medication orders. Ceftin per medication orders. Follow up in a few days or as needed.

## 2011-04-09 NOTE — Progress Notes (Signed)
Addended by: Arnette Norris on: 04/09/2011 12:06 PM   Modules accepted: Orders

## 2011-04-09 NOTE — Patient Instructions (Addendum)
Sinusitis Sinuses are air pockets within the bones of your face. The growth of bacteria within a sinus leads to infection. Infection keeps the sinuses from draining. This infection is called sinusitis. SYMPTOMS There will be different areas of pain depending on which sinuses have become infected.  The maxillary sinuses often produce pain beneath the eyes.   Frontal sinusitis may cause pain in the middle of the forehead and above the eyes.  Other problems (symptoms) include:  Toothaches.   Colored, pus-like (purulent) drainage from the nose.   Any swelling, warmth, or tenderness over the sinus areas may be signs of infection.  TREATMENT Sinusitis is most often determined by an exam and you may have x-rays taken. If x-rays have been taken, make sure you obtain your results. Or find out how you are to obtain them. Your caregiver may give you medications (antibiotics). These are medications that will help kill the infection. You may also be given a medication (decongestant) that helps to reduce sinus swelling.  HOME CARE INSTRUCTIONS  Only take over-the-counter or prescription medicines for pain, discomfort, or fever as directed by your caregiver.   Drink extra fluids. Fluids help thin the mucus so your sinuses can drain more easily.   Applying either moist heat or ice packs to the sinus areas may help relieve discomfort.   Use saline nasal sprays to help moisten your sinuses. The sprays can be found at your local drugstore.  SEEK IMMEDIATE MEDICAL CARE IF YOU DEVELOP:  High fever that is still present after two days of antibiotic treatment.   Increasing pain, severe headaches, or toothache.   Nausea, vomiting, or drowsiness.   Unusual swelling around the face or trouble seeing.  MAKE SURE YOU:   Understand these instructions.   Will watch your condition.   Will get help right away if you are not doing well or get worse.  Document Released: 09/24/2005 Document Re-Released:  09/06/2008 [facility] Patient Information 2011 [place], [place]. Sinusitis Sinuses are air pockets within the bones of your face. The growth of bacteria within a sinus leads to infection. Infection keeps the sinuses from draining. This infection is called sinusitis. SYMPTOMS There will be different areas of pain depending on which sinuses have become infected.  The maxillary sinuses often produce pain beneath the eyes.   Frontal sinusitis may cause pain in the middle of the forehead and above the eyes.  Other problems (symptoms) include:  Toothaches.   Colored, pus-like (purulent) drainage from the nose.   Any swelling, warmth, or tenderness over the sinus areas may be signs of infection.  TREATMENT Sinusitis is most often determined by an exam and you may have x-rays taken. If x-rays have been taken, make sure you obtain your results. Or find out how you are to obtain them. Your caregiver may give you medications (antibiotics). These are medications that will help kill the infection. You may also be given a medication (decongestant) that helps to reduce sinus swelling.  HOME CARE INSTRUCTIONS  Only take over-the-counter or prescription medicines for pain, discomfort, or fever as directed by your caregiver.   Drink extra fluids. Fluids help thin the mucus so your sinuses can drain more easily.   Applying either moist heat or ice packs to the sinus areas may help relieve discomfort.   Use saline nasal sprays to help moisten your sinuses. The sprays can be found at your local drugstore.  SEEK IMMEDIATE MEDICAL CARE IF YOU DEVELOP:  High fever that is still present after  two days of antibiotic treatment.   Increasing pain, severe headaches, or toothache.   Nausea, vomiting, or drowsiness.   Unusual swelling around the face or trouble seeing.  MAKE SURE YOU:   Understand these instructions.   Will watch your condition.   Will get help right away if you are not doing well or get  worse.  Document Released: 09/24/2005 Document Re-Released: 09/06/2008 [facility] Patient Information 2011 [place], [place].Place sinusitis patient instructions here.

## 2011-05-01 ENCOUNTER — Telehealth: Payer: Self-pay | Admitting: *Deleted

## 2011-05-01 NOTE — Telephone Encounter (Signed)
Discuss with patient  

## 2011-05-01 NOTE — Telephone Encounter (Signed)
Pt still c/o some drainage and coughing up clear mucous. Pt denies any fever, sore throat, or SOB. Pt seen on 04-09-11 for sinusitis.Please advise

## 2011-05-01 NOTE — Telephone Encounter (Signed)
mucinex Antihistamine---like claritin, zyrtec, allegra etc And con't nasal spray

## 2011-05-14 ENCOUNTER — Other Ambulatory Visit: Payer: Self-pay | Admitting: Family Medicine

## 2011-06-04 ENCOUNTER — Telehealth: Payer: Self-pay | Admitting: *Deleted

## 2011-06-04 DIAGNOSIS — J329 Chronic sinusitis, unspecified: Secondary | ICD-10-CM

## 2011-06-04 NOTE — Telephone Encounter (Signed)
Referral put in     [place]

## 2011-06-04 NOTE — Telephone Encounter (Signed)
Pt still c/o sinus issue, drainage, difficult breathing, headache, congestion and cough. Pt is requesting to be referred to specialist. .Please advise

## 2011-06-04 NOTE — Telephone Encounter (Signed)
Refer ENT 

## 2011-06-18 ENCOUNTER — Other Ambulatory Visit: Payer: Self-pay | Admitting: Family Medicine

## 2011-07-16 ENCOUNTER — Other Ambulatory Visit: Payer: Self-pay | Admitting: Otolaryngology

## 2011-07-18 ENCOUNTER — Other Ambulatory Visit: Payer: Self-pay | Admitting: Family Medicine

## 2011-08-09 HISTORY — PX: ADENOIDECTOMY: SUR15

## 2011-08-19 ENCOUNTER — Other Ambulatory Visit: Payer: Self-pay | Admitting: Family Medicine

## 2011-09-19 ENCOUNTER — Other Ambulatory Visit: Payer: Self-pay | Admitting: Family Medicine

## 2011-10-22 ENCOUNTER — Ambulatory Visit (INDEPENDENT_AMBULATORY_CARE_PROVIDER_SITE_OTHER): Payer: PRIVATE HEALTH INSURANCE | Admitting: Family Medicine

## 2011-10-22 ENCOUNTER — Encounter: Payer: Self-pay | Admitting: Family Medicine

## 2011-10-22 VITALS — BP 130/84 | HR 50 | Temp 98.4°F | Ht 73.5 in | Wt 225.2 lb

## 2011-10-22 DIAGNOSIS — I10 Essential (primary) hypertension: Secondary | ICD-10-CM

## 2011-10-22 DIAGNOSIS — Z23 Encounter for immunization: Secondary | ICD-10-CM

## 2011-10-22 DIAGNOSIS — E785 Hyperlipidemia, unspecified: Secondary | ICD-10-CM

## 2011-10-22 DIAGNOSIS — Z0000 Encounter for general adult medical examination without abnormal findings: Secondary | ICD-10-CM

## 2011-10-22 LAB — LIPID PANEL
Cholesterol: 127 mg/dL (ref 0–200)
HDL: 42.2 mg/dL (ref >39.00)
VLDL: 14 mg/dL (ref 0.0–40.0)

## 2011-10-22 LAB — BASIC METABOLIC PANEL
BUN: 11 mg/dL (ref 6–23)
CO2: 28 mEq/L (ref 19–32)
Chloride: 100 mEq/L (ref 96–112)
Glucose, Bld: 86 mg/dL (ref 70–99)
Potassium: 3.7 mEq/L (ref 3.5–5.1)

## 2011-10-22 LAB — POCT URINALYSIS DIPSTICK
Bilirubin, UA: NEGATIVE
Ketones, UA: NEGATIVE
Leukocytes, UA: NEGATIVE
Protein, UA: NEGATIVE
Spec Grav, UA: 1.01

## 2011-10-22 LAB — PSA: PSA: 2.11 ng/mL (ref 0.10–4.00)

## 2011-10-22 LAB — HEPATIC FUNCTION PANEL
Albumin: 4.4 g/dL (ref 3.5–5.2)
Total Protein: 7.8 g/dL (ref 6.0–8.3)

## 2011-10-22 NOTE — Assessment & Plan Note (Signed)
Check labs con't meds 

## 2011-10-22 NOTE — Progress Notes (Signed)
Subjective:    Patient ID: Bobby Castaneda, male    DOB: 1965/12/05, 46 y.o.   MRN: [id number]  HPI  Pt here for cpe and labs.  No complaints.  Review of Systems Review of Systems  Constitutional: Negative for activity change, appetite change and fatigue.  HENT: Negative for hearing loss, congestion, tinnitus and ear discharge.  dentist q41m Eyes: Negative for visual disturbance opth-- due Respiratory: Negative for cough, chest tightness and shortness of breath.   Cardiovascular: Negative for chest pain, palpitations and leg swelling.  Gastrointestinal: Negative for abdominal pain, diarrhea, constipation and abdominal distention.  Genitourinary: Negative for urgency, frequency, decreased urine volume and difficulty urinating.  Musculoskeletal: Negative for back pain, arthralgias and gait problem.  Skin: Negative for color change, pallor and rash.  Neurological: Negative for dizziness, light-headedness, numbness and headaches.  Hematological: Negative for adenopathy. Does not bruise/bleed easily.  Psychiatric/Behavioral: Negative for suicidal ideas, confusion, sleep disturbance, self-injury, dysphoric mood, decreased concentration and agitation.   Past Medical History  Diagnosis Date  . Hypertension   . Hyperlipidemia    History   Social History  . Marital Status: Married    Spouse Name: N/A    Number of Children: N/A  . Years of Education: N/A   Occupational History  . Credit [employer]    Social History Main Topics  . Smoking status: Never Smoker   . Smokeless tobacco: Not on file  . Alcohol Use: No  . Drug Use: No  . Sexually Active: Yes -- Male partner(s)   Other Topics Concern  . Not on file   Social History Narrative   Regular Exercise- qd    Family History  Problem Relation Age of Onset  . Hypertension    . Hypertension Mother   . Hypertension Father   . Hyperlipidemia Father   . Stroke Father   . Cancer Father     prostate  . Hypertension Sister    . Cancer Maternal Aunt 37    breast  . Heart disease Maternal Uncle     mi  . Hypertension Maternal Uncle   . Hypertension Maternal Grandmother   . Alzheimer's disease Maternal Grandmother   . Heart disease Maternal Grandfather     mi  . Hypertension Maternal Grandfather   . Hyperlipidemia Maternal Grandfather   . Stroke Maternal Grandfather   . Stroke Maternal Uncle   . Hypertension Maternal Uncle          Objective:   Physical Exam  BP 130/84  Pulse 50  Temp(Src) 98.4 F (36.9 C) (Oral)  Ht 6' 1.5" (1.867 m)  Wt 225 lb 3.2 oz (102.15 kg)  BMI 29.31 kg/m2  SpO2 98%  General Appearance:    Alert, cooperative, no distress, appears stated age  Head:    Normocephalic, without obvious abnormality, atraumatic  Eyes:    PERRL, conjunctiva/corneas clear, EOM's intact, fundi    benign, both eyes       Ears:    Normal TM's and external ear canals, both ears  Nose:   Nares normal, septum midline, mucosa normal, no drainage   or sinus tenderness  Throat:   Lips, mucosa, and tongue normal; teeth and gums normal  Neck:   Supple, symmetrical, trachea midline, no adenopathy;       thyroid:  No enlargement/tenderness/nodules; no carotid   bruit or JVD  Back:     Symmetric, no curvature, ROM normal, no CVA tenderness  Lungs:     Clear to auscultation bilaterally,  respirations unlabored  Chest wall:    No tenderness or deformity  Heart:    Regular rate and rhythm, S1 and S2 normal, no murmur, rub   or gallop  Abdomen:     Soft, non-tender, bowel sounds active all four quadrants,    no masses, no organomegaly  Genitalia:    Normal male without lesion, discharge or tenderness  Rectal:    Normal tone, normal prostate, no masses or tenderness;   guaiac negative stool  Extremities:   Extremities normal, atraumatic, no cyanosis or edema  Pulses:   2+ and symmetric all extremities  Skin:   Skin color, texture, turgor normal, no rashes or lesions  Lymph nodes:   Cervical,  supraclavicular, and axillary nodes normal  Neurologic:   CNII-XII intact. Normal strength, sensation and reflexes      throughout        Assessment & Plan:  cpe-- ghm utd            Check labs

## 2011-10-22 NOTE — Patient Instructions (Signed)
Preventative Care for Adults, Male A healthy lifestyle and preventative care can promote health and wellness. Preventative health guidelines for men include the following key practices:  A routine yearly physical is a good way to check with your caregiver about your health and preventative screening. It is a chance to share any concerns and updates on your health, and to receive a thorough exam.   Visit your dentist for a routine exam and preventative care every 6 months. Brush your teeth twice a day and floss once a day. Good oral hygiene prevents tooth decay and gum disease.   The frequency of eye exams is based on your age, health, family medical history, use of contact lenses, and other factors. Follow your caregiver's recommendations for frequency of eye exams.   Eat a healthy diet. Foods like vegetables, fruits, whole grains, low-fat dairy products, and lean protein foods contain the nutrients you need without too many calories. Decrease your intake of foods high in solid fats, added sugars, and salt. Eat the right amount of calories for you.Get information about a proper diet from your caregiver, if necessary.   Regular physical exercise is one of the most important things you can do for your health. Most adults should get at least 150 minutes of moderate-intensity exercise (any activity that increases your heart rate and causes you to sweat) each week. In addition, most adults need muscle-strengthening exercises on 2 or more days a week.   Maintain a healthy weight. The body mass index (BMI) is a screening tool to identify possible weight problems. It provides an estimate of body fat based on height and weight. Your caregiver can help determine your BMI, and can help you achieve or maintain a healthy weight.For adults 20 years and older:   A BMI below 18.5 is considered underweight.   A BMI of 18.5 to 24.9 is normal.   A BMI of 25 to 29.9 is considered overweight.   A BMI of 30 and  above is considered obese.   Maintain normal blood lipids and cholesterol levels by exercising and minimizing your intake of saturated fat. Eat a balanced diet with plenty of fruit and vegetables. Blood tests for lipids and cholesterol should begin at age 20 and be repeated every 5 years. If your lipid or cholesterol levels are high, you are over 50, or you are a high risk for heart disease, you may need your cholesterol levels checked more frequently.Ongoing high lipid and cholesterol levels should be treated with medicines if diet and exercise are not effective.   If you smoke, find out from your caregiver how to quit. If you do not use tobacco, do not start.   If you choose to drink alcohol, do not exceed 2 drinks per day. One drink is considered to be 12 ounces (355 mL) of beer, 5 ounces (148 mL) of wine, or 1.5 ounces (44 mL) of liquor.   Avoid use of street drugs. Do not share needles with anyone. Ask for help if you need support or instructions about stopping the use of drugs.   High blood pressure causes heart disease and increases the risk of stroke. Your blood pressure should be checked at least every 1 to 2 years. Ongoing high blood pressure should be treated with medicines, if weight loss and exercise are not effective.   If you are 45 to 47 years old, ask your caregiver if you should take aspirin to prevent heart disease.   Diabetes screening involves taking a blood   sample to check your fasting blood sugar level. This should be done once every 3 years, after age 45, if you are within normal weight and without risk factors for diabetes. Testing should be considered at a younger age or be carried out more frequently if you are overweight and have at least 1 risk factor for diabetes.   Colorectal cancer can be detected and often prevented. Most routine colorectal cancer screening begins at the age of 50 and continues through age 75. However, your caregiver may recommend screening at an  earlier age if you have risk factors for colon cancer. On a yearly basis, your caregiver may provide home test kits to check for hidden blood in the stool. Use of a small camera at the end of a tube, to directly examine the colon (sigmoidoscopy or colonoscopy), can detect the earliest forms of colorectal cancer. Talk to your caregiver about this at age 50, when routine screening begins. Direct examination of the colon should be repeated every 5 to 10 years through age 75, unless early forms of pre-cancerous polyps or small growths are found.   Practice safe sex. Use condoms and avoid high-risk sexual practices to reduce the spread of sexually transmitted infections (STIs). STIs include gonorrhea, chlamydia, syphilis, trichomonas, herpes, HPV, and human immunodeficiency virus (HIV). Herpes, HIV, and HPV are viral illnesses that have no cure. They can result in disability, cancer, and death.   A one-time screening for abdominal aortic aneurysm (AAA) and surgical repair of large AAAs by sound wave imaging (ultrasonography) is recommended for ages 65 to 75 years who are current or former smokers.   Healthy men should no longer receive prostate-specific antigen (PSA) blood tests as part of routine cancer screening. Consult with your caregiver about prostate cancer screening.   Use sunscreen with skin protection factor (SPF) of 30 or more. Apply sunscreen liberally and repeatedly throughout the day. You should seek shade when your shadow is shorter than you. Protect yourself by wearing long sleeves, pants, a wide-brimmed hat, and sunglasses year round, whenever you are outdoors.   Once a month, do a whole body skin exam, using a mirror to look at the skin on your back. Notify your caregiver of new moles, moles that have irregular borders, moles that are larger than a pencil eraser, or moles that have changed in shape or color.   Stay current with required immunizations.   Influenza. You need a dose every  fall (or winter). The composition of the flu vaccine changes each year, so being vaccinated once is not enough.   Pneumococcal polysaccharide. You need 1 to 2 doses if you smoke cigarettes or if you have certain chronic medical conditions. You need 1 dose at age 65 (or older) if you have never been vaccinated.   Tetanus, diphtheria, pertussis (Tdap, Td). Get 1 dose of Tdap vaccine if you are younger than age 65 years, are over 65 and have contact with an infant, are a healthcare worker, or simply want to be protected from whooping cough. After that, you need a Td booster dose every 10 years. Consult your caregiver if you have not had at least 3 tetanus and diphtheria-containing shots sometime in your life or have a deep or dirty wound.   HPV. This vaccine is recommended for males 13 through 47 years of age. This vaccine may be given to men 22 through 47 years of age who have not completed the 3 dose series. It is recommended for men through age 26   who have sex with men or whose immune system is weakened because of HIV infection, other illness, or medications. The vaccine is given in 3 doses over 6 months.   Measles, mumps, rubella (MMR). You need at least 1 dose of MMR if you were born in 1957 or later. You may also need a 2nd dose.   Meningococcal. If you are age 19 to 21 years and a first-year college student living in a residence hall, or have one of several medical conditions, you need to get vaccinated against meningococcal disease. You may also need additional booster doses.   Zoster (shingles). If you are age 60 years or older, you should get this vaccine.   Varicella (chickenpox). If you have never had chickenpox or you were vaccinated but received only 1 dose, talk to your caregiver to find out if you need this vaccine.   Hepatitis A. You need this vaccine if you have a specific risk factor for hepatitis A virus infection, or you simply wish to be protected from this disease. The vaccine is  usually given as 2 doses, 6 to 18 months apart.   Hepatitis B. You need this vaccine if you have a specific risk factor for hepatitis B virus infection or you simply wish to be protected from this disease. The vaccine is given in 3 doses, usually over 6 months.  Preventative Service / Frequency Ages 19 to 39  Blood pressure check.** / Every 1 to 2 years.   Lipid and cholesterol check.**/ Every 5 years beginning at age 20.   Skin self-exam. / Monthly.   Influenza immunization.** / Every year.   Pneumococcal polysaccharide immunization.** / 1 to 2 doses if you smoke cigarettes or if you have certain chronic medical conditions.   Tetanus, diphtheria, pertussis (Tdap,Td) immunization. / A one-time dose of Tdap vaccine. After that, you need a Td booster dose every 10 years.   HPV immunization. / 3 doses over 6 months, if 26 and younger.   Measles, mumps, rubella (MMR) immunization. / You need at least 1 dose of MMR if you were born in 1957 or later. You may also need a 2nd dose.   Meningococcal immunization. / 1 dose if you are age 19 to 21 years and a first-year college student living in a residence hall, or have one of several medical conditions, you need to get vaccinated against meningococcal disease. You may also need additional booster doses.   Varicella immunization. **/ Consult your caregiver.   Hepatitis A immunization. ** / Consult your caregiver. 2 doses, 6 to 18 months apart.   Hepatitis B immunization.** / Consult your caregiver. 3 doses usually over 6 months.  Ages 40 to 64  Blood pressure check.** / Every 1 to 2 years.   Lipid and cholesterol check.**/ Every 5 years beginning at age 20.   Fecal occult blood test (FOBT) of stool. / Every year beginning at age 50 and continuing until age 75. You may not have to do this test if you get colonoscopy every 10 years.   Flexible sigmoidoscopy** or colonoscopy.** / Every 5 years for a flexible sigmoidoscopy or every 10 years for  a colonoscopy beginning at age 50 and continuing until age 75.   Skin self-exam. / Monthly.   Influenza immunization.** / Every year.   Pneumococcal polysaccharide immunization.** / 1 to 2 doses if you smoke cigarettes or if you have certain chronic medical conditions.   Tetanus, diphtheria, pertussis (Tdap/Td) immunization.** / A one-time dose of   Tdap vaccine. After that, you need a Td booster dose every 10 years.   Measles, mumps, rubella (MMR) immunization. / You need at least 1 dose of MMR if you were born in 1957 or later. You may also need a 2nd dose.   Varicella immunization. **/ Consult your caregiver.   Meningococcal immunization.** / Consult your caregiver.   Hepatitis A immunization. ** / Consult your caregiver. 2 doses, 6 to 18 months apart.   Hepatitis B immunization.** / Consult your caregiver. 3 doses, usually over 6 months.  Ages 65 and over  Blood pressure check.** / Every 1 to 2 years.   Lipid and cholesterol check.**/ Every 5 years beginning at age 20.   Fecal occult blood test (FOBT) of stool. / Every year beginning at age 50 and continuing until age 75. You may not have to do this test if you get colonoscopy every 10 years.   Flexible sigmoidoscopy** or colonoscopy.** / Every 5 years for a flexible sigmoidoscopy or every 10 years for a colonoscopy beginning at age 50 and continuing until age 75.   Abdominal aortic aneurysm (AAA) screening.** / A one-time screening for ages 65 to 75 years who are current or former smokers.   Skin self-exam. / Monthly.   Influenza immunization.** / Every year.   Pneumococcal polysaccharide immunization.** / 1 dose at age 65 (or older) if you have never been vaccinated.   Tetanus, diphtheria, pertussis (Tdap, Td) immunization. / A one-time dose of Tdap vaccine if you are over 65 and have contact with an infant, are a healthcare worker, or simply want to be protected from whooping cough. After that, you need a Td booster dose  every 10 years.   Varicella immunization. **/ Consult your caregiver.   Meningococcal immunization.** / Consult your caregiver.   Hepatitis A immunization. ** / Consult your caregiver. 2 doses, 6 to 18 months apart.   Hepatitis B immunization.** / Check with your caregiver. 3 doses, usually over 6 months.  **Family history and personal history of risk and conditions may change your caregiver's recommendations. Document Released: 11/20/2001 Document Revised: 06/06/2011 Document Reviewed: 02/19/2011 [employer] Patient Information 2012 [employer]. 

## 2011-10-22 NOTE — Assessment & Plan Note (Signed)
Stable con't meds 

## 2011-10-23 LAB — CBC WITH DIFFERENTIAL/PLATELET
HCT: 40.4 % (ref 39.0–52.0)
Hemoglobin: 13.6 g/dL (ref 13.0–17.0)
MCV: 89.6 fl (ref 78.0–100.0)
Platelets: 258 10*3/uL (ref 150.0–400.0)
RDW: 13.2 % (ref 11.5–14.6)

## 2011-10-26 ENCOUNTER — Encounter: Payer: Self-pay | Admitting: *Deleted

## 2011-10-27 ENCOUNTER — Other Ambulatory Visit: Payer: Self-pay | Admitting: Family Medicine

## 2011-10-29 ENCOUNTER — Other Ambulatory Visit: Payer: Self-pay | Admitting: Family Medicine

## 2011-10-29 ENCOUNTER — Telehealth: Payer: Self-pay | Admitting: Family Medicine

## 2011-10-29 DIAGNOSIS — G4730 Sleep apnea, unspecified: Secondary | ICD-10-CM

## 2011-10-29 NOTE — Telephone Encounter (Signed)
Referral in

## 2011-10-29 NOTE — Telephone Encounter (Signed)
Patient is calling asking if Dr. Laury Axon was going to refer him in reference to possible Sleep Apnea, due to him being so tired in the mornings, and wife stating sometimes he stops breathing.  Please advise.

## 2011-10-29 NOTE — Telephone Encounter (Signed)
Please advise      KP 

## 2011-11-13 ENCOUNTER — Ambulatory Visit (INDEPENDENT_AMBULATORY_CARE_PROVIDER_SITE_OTHER): Payer: PRIVATE HEALTH INSURANCE | Admitting: Pulmonary Disease

## 2011-11-13 ENCOUNTER — Encounter: Payer: Self-pay | Admitting: Pulmonary Disease

## 2011-11-13 VITALS — BP 132/84 | HR 67 | Temp 98.6°F | Ht 73.5 in | Wt 225.6 lb

## 2011-11-13 DIAGNOSIS — G4733 Obstructive sleep apnea (adult) (pediatric): Secondary | ICD-10-CM

## 2011-11-13 NOTE — Progress Notes (Signed)
  Subjective:    Patient ID: Bobby Castaneda, male    DOB: [birth year], 46 y.o.   MRN: [id number]  HPI The patient is a 46 year old male who I've been asked to see for possible obstructive sleep apnea.  He is been noted to have loud snoring, as well as an abnormal breathing pattern during sleep.  The patient states this has been brought to his attention not only by his bed partner, but also by his dentist, and after a recent medical procedure.  The patient denies having frequent awakenings, but does not feel rested in the mornings upon arising.  He notes definite sleep pressured during the day with periods of inactivity, and will fall sleep easily in the evenings while watching television or movies.  He denies any sleepiness driving shorter distances, but does have issues with longer distances.  He states that his weight is up about 10 pounds over the last 2 years, and his Epworth score today is abnormal at 11.  Sleep Questionnaire: What time do you typically go to bed?( Between what hours) 12:00- 1:30 am How long does it take you to fall asleep? 15 to 30 mins How many times during the night do you wake up? 0 What time do you get out of bed to start your day? 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 (In pounds) 10 lb (4.536 kg) Have you ever had a sleep study before? No Do you currently use CPAP? No Do you wear oxygen at any time? No    Review of Systems  Constitutional: Negative for fever and unexpected weight change.  HENT: Negative for ear pain, nosebleeds, congestion, sore throat, rhinorrhea, sneezing, trouble swallowing, dental problem, postnasal drip and sinus pressure.   Eyes: Negative for redness and itching.  Respiratory: Positive for shortness of breath. Negative for cough, chest tightness and wheezing.   Cardiovascular: Negative for palpitations and leg swelling.  Gastrointestinal: Negative for  nausea and vomiting.  Genitourinary: Negative for dysuria.  Musculoskeletal: Negative for joint swelling.  Skin: Negative for rash.  Neurological: Negative for headaches.  Hematological: Does not bruise/bleed easily.  Psychiatric/Behavioral: Negative for dysphoric mood. The patient is not nervous/anxious.        Objective:   Physical Exam Constitutional:  Well developed, no acute distress  HENT:  Nares patent without discharge, but large turbinates.  Oropharynx without exudate, palate and uvula are significantly elongated.  Eyes:  Perrla, eomi, no scleral icterus  Neck:  No JVD, no TMG  Cardiovascular:  Normal rate, regular rhythm, no rubs or gallops.  No murmurs        Intact distal pulses  Pulmonary :  Normal breath sounds, no stridor or respiratory distress   No rales, rhonchi, or wheezing  Abdominal:  Soft, nondistended, bowel sounds present.  No tenderness noted.   Musculoskeletal:  No lower extremity edema noted.  Lymph Nodes:  No cervical lymphadenopathy noted  Skin:  No cyanosis noted  Neurologic:  Alert, appropriate, moves all 4 extremities without obvious deficit.         Assessment & Plan:

## 2011-11-13 NOTE — Assessment & Plan Note (Signed)
The patient's history is very suspicious for clinically significant obstructive sleep apnea.  I had a long discussion with him about sleep apnea, including its impact to his quality of life and cardiovascular health.  I have reviewed the various ways to establish a diagnosis, and think that he is a good candidate for whom sleep testing.  The patient is agreeable.  We'll arrange followup once the results are available.

## 2011-11-13 NOTE — Patient Instructions (Signed)
Will set up for a sleep study, and arrange followup once results are available.   

## 2011-12-05 ENCOUNTER — Telehealth: Payer: Self-pay | Admitting: Pulmonary Disease

## 2011-12-05 NOTE — Telephone Encounter (Signed)
Called and spoke with pt.  Pt scheduled to see KC tomorrow at 9:45am.   

## 2011-12-05 NOTE — Telephone Encounter (Signed)
Bobby Castaneda, pt needs ov to review his recent sleep study.

## 2011-12-06 ENCOUNTER — Ambulatory Visit (INDEPENDENT_AMBULATORY_CARE_PROVIDER_SITE_OTHER): Payer: PRIVATE HEALTH INSURANCE | Admitting: Pulmonary Disease

## 2011-12-06 ENCOUNTER — Encounter: Payer: Self-pay | Admitting: Pulmonary Disease

## 2011-12-06 VITALS — BP 122/80 | HR 68 | Temp 98.5°F | Ht 73.5 in | Wt 224.0 lb

## 2011-12-06 DIAGNOSIS — G4733 Obstructive sleep apnea (adult) (pediatric): Secondary | ICD-10-CM

## 2011-12-06 NOTE — Assessment & Plan Note (Signed)
The patient surprisingly was found to have severe obstructive sleep apnea by his recent sleep study.  I have reviewed this study with him, and also discussed the various treatment options of sleep apnea in general.  Because of the severely of his sleep apnea, CPAP coupled with weight loss would be his best treatment choices.  The patient is willing to do this. I will set the patient up on cpap at a moderate pressure level to allow for desensitization, and will troubleshoot the device over the next 4-6weeks if needed.  The pt is to call me if having issues with tolerance.  Will then optimize the pressure once patient is able to wear cpap on a consistent basis.

## 2011-12-06 NOTE — Progress Notes (Signed)
  Subjective:    Patient ID: Bobby Castaneda, male    DOB: 1966/08/31, 46 y.o.   MRN: [id number]  HPI The patient comes in today for followup of his recent sleep study.  He was found to have severe obstructive sleep apnea, with an AHI of 40 events per hour.  He had desaturation as low as 77%.  I have reviewed this study with him in detail, and answered all of his questions.   Review of Systems  Constitutional: Negative for fever and unexpected weight change.  HENT: Negative for ear pain, nosebleeds, congestion, sore throat, rhinorrhea, sneezing, trouble swallowing, dental problem, postnasal drip and sinus pressure.   Eyes: Negative for redness and itching.  Respiratory: Negative for cough, chest tightness, shortness of breath and wheezing.   Cardiovascular: Negative for palpitations and leg swelling.  Gastrointestinal: Negative for nausea and vomiting.  Genitourinary: Negative for dysuria.  Musculoskeletal: Negative for joint swelling.  Skin: Negative for rash.  Neurological: Negative for headaches.  Hematological: Does not bruise/bleed easily.  Psychiatric/Behavioral: Negative for dysphoric mood. The patient is not nervous/anxious.        Objective:   Physical Exam Well-developed male in no acute distress Nose without discharge or purulence noted Lower extremities without edema, no cyanosis Alert, does not appear to be overly sleepy, moves all 4 extremities.       Assessment & Plan:

## 2011-12-06 NOTE — Patient Instructions (Signed)
Will start on cpap.  Please call if having tolerance issues. Work on weight reduction. followup with me in 6 weeks.  

## 2011-12-07 ENCOUNTER — Encounter: Payer: Self-pay | Admitting: Pulmonary Disease

## 2011-12-07 ENCOUNTER — Ambulatory Visit (INDEPENDENT_AMBULATORY_CARE_PROVIDER_SITE_OTHER): Payer: PRIVATE HEALTH INSURANCE | Admitting: Pulmonary Disease

## 2011-12-07 DIAGNOSIS — G4733 Obstructive sleep apnea (adult) (pediatric): Secondary | ICD-10-CM

## 2011-12-11 ENCOUNTER — Telehealth: Payer: Self-pay | Admitting: Pulmonary Disease

## 2011-12-11 NOTE — Telephone Encounter (Signed)
Order was sent to [facility] on 12/06/11 by [facility] to set pt up with Cpap. Libby sent order to [facility].  Pt calling today stating [facility] not in network.  Needs a different DME company.  Libby, please advise.  Thanks!!!

## 2011-12-12 NOTE — Telephone Encounter (Signed)
Order faxed to [facility] for cpap set up

## 2012-01-18 ENCOUNTER — Ambulatory Visit (INDEPENDENT_AMBULATORY_CARE_PROVIDER_SITE_OTHER): Payer: Commercial Indemnity | Admitting: Pulmonary Disease

## 2012-01-18 ENCOUNTER — Encounter: Payer: Self-pay | Admitting: Pulmonary Disease

## 2012-01-18 VITALS — BP 100/80 | HR 55 | Temp 98.9°F | Ht 73.0 in | Wt 219.2 lb

## 2012-01-18 DIAGNOSIS — G4733 Obstructive sleep apnea (adult) (pediatric): Secondary | ICD-10-CM

## 2012-01-18 NOTE — Progress Notes (Signed)
  Subjective:    Patient ID: Bobby Castaneda, male    DOB: 04-28-1966, 46 y.o.   MRN: [id number]  HPI The patient comes in today for followup of his obstructive sleep apnea.  He was started on CPAP and a moderate pressure level at the last visit, and feels that overall he has done well with the device.  His download shows that he is wearing about 4 hours a night compliantly, but he is complaining of some issues with pressure tolerates.  He is not aware of how to use the ramp button, and I have instructed him on how to operate.  He feels that he sleeps much better with CPAP, and has seen improvement in his daytime alertness.   Review of Systems  Constitutional: Negative for fever and unexpected weight change.  HENT: Positive for congestion, sore throat, rhinorrhea and postnasal drip. Negative for ear pain, nosebleeds, sneezing, trouble swallowing, dental problem and sinus pressure.   Eyes: Negative for redness and itching.  Respiratory: Negative for cough, chest tightness, shortness of breath and wheezing.   Cardiovascular: Negative for palpitations and leg swelling.  Gastrointestinal: Negative for nausea and vomiting.  Genitourinary: Negative for dysuria.  Musculoskeletal: Negative for joint swelling.  Skin: Negative for rash.  Neurological: Negative for headaches.  Hematological: Does not bruise/bleed easily.  Psychiatric/Behavioral: Negative for dysphoric mood. The patient is not nervous/anxious.        Objective:   Physical Exam Overweight male in no acute distress No skin breakdown or pressure necrosis from the CPAP mask Lower extremities without edema, no cyanosis Alert and oriented, moves all 4 extremities.       Assessment & Plan:

## 2012-01-18 NOTE — Patient Instructions (Signed)
Will optimize your pressure on auto setting for next 2 weeks.  Will call you with set pressure and send to your dme Work on weight loss followup with me in 6mos if doing well.

## 2012-01-18 NOTE — Assessment & Plan Note (Signed)
The patient has been wearing CPAP compliantly, and has seen improvement in his sleep and daytime alertness.  I've explained to him that we need to optimize his pressure, and we'll also work on pressure tolerance.  I've instructed him how to use the ramp feature, and this will help.  I have also encouraged him to work aggressively on weight loss.

## 2012-03-22 ENCOUNTER — Other Ambulatory Visit: Payer: Self-pay | Admitting: Pulmonary Disease

## 2012-03-22 DIAGNOSIS — G4733 Obstructive sleep apnea (adult) (pediatric): Secondary | ICD-10-CM

## 2012-04-23 ENCOUNTER — Other Ambulatory Visit: Payer: PRIVATE HEALTH INSURANCE

## 2012-05-02 ENCOUNTER — Other Ambulatory Visit (INDEPENDENT_AMBULATORY_CARE_PROVIDER_SITE_OTHER): Payer: Commercial Indemnity

## 2012-05-02 DIAGNOSIS — E785 Hyperlipidemia, unspecified: Secondary | ICD-10-CM

## 2012-05-02 LAB — LIPID PANEL
Cholesterol: 138 mg/dL (ref 0–200)
LDL Cholesterol: 74 mg/dL (ref 0–99)
Total CHOL/HDL Ratio: 3

## 2012-05-02 LAB — HEPATIC FUNCTION PANEL
Alkaline Phosphatase: 57 U/L (ref 39–117)
Bilirubin, Direct: 0 mg/dL (ref 0.0–0.3)
Total Bilirubin: 0.6 mg/dL (ref 0.3–1.2)

## 2012-05-02 NOTE — Progress Notes (Signed)
Labs only

## 2012-05-05 ENCOUNTER — Other Ambulatory Visit: Payer: Self-pay

## 2012-05-11 ENCOUNTER — Other Ambulatory Visit: Payer: Self-pay | Admitting: Family Medicine

## 2012-11-22 ENCOUNTER — Other Ambulatory Visit: Payer: Self-pay

## 2012-12-01 ENCOUNTER — Other Ambulatory Visit: Payer: Self-pay | Admitting: Family Medicine

## 2013-01-07 ENCOUNTER — Other Ambulatory Visit: Payer: Self-pay | Admitting: Family Medicine

## 2013-01-30 ENCOUNTER — Encounter: Payer: Self-pay | Admitting: Family Medicine

## 2013-01-30 ENCOUNTER — Ambulatory Visit (INDEPENDENT_AMBULATORY_CARE_PROVIDER_SITE_OTHER): Payer: Self-pay | Admitting: Family Medicine

## 2013-01-30 VITALS — BP 118/84 | HR 55 | Temp 98.6°F | Ht 73.5 in | Wt 222.2 lb

## 2013-01-30 DIAGNOSIS — M1710 Unilateral primary osteoarthritis, unspecified knee: Secondary | ICD-10-CM

## 2013-01-30 DIAGNOSIS — R809 Proteinuria, unspecified: Secondary | ICD-10-CM

## 2013-01-30 DIAGNOSIS — Z0000 Encounter for general adult medical examination without abnormal findings: Secondary | ICD-10-CM

## 2013-01-30 DIAGNOSIS — E785 Hyperlipidemia, unspecified: Secondary | ICD-10-CM

## 2013-01-30 DIAGNOSIS — IMO0002 Reserved for concepts with insufficient information to code with codable children: Secondary | ICD-10-CM

## 2013-01-30 DIAGNOSIS — I10 Essential (primary) hypertension: Secondary | ICD-10-CM

## 2013-01-30 LAB — HEPATIC FUNCTION PANEL
AST: 23 U/L (ref 0–37)
Albumin: 4.3 g/dL (ref 3.5–5.2)
Alkaline Phosphatase: 70 U/L (ref 39–117)
Total Bilirubin: 0.5 mg/dL (ref 0.3–1.2)
Total Protein: 7.9 g/dL (ref 6.0–8.3)

## 2013-01-30 LAB — BASIC METABOLIC PANEL
Chloride: 102 mEq/L (ref 96–112)
Potassium: 3.9 mEq/L (ref 3.5–5.3)

## 2013-01-30 LAB — CBC WITH DIFFERENTIAL/PLATELET
Eosinophils Absolute: 0.1 10*3/uL (ref 0.0–0.7)
Eosinophils Relative: 3 % (ref 0–5)
HCT: 39.2 % (ref 39.0–52.0)
Hemoglobin: 13.6 g/dL (ref 13.0–17.0)
Lymphs Abs: 2.1 10*3/uL (ref 0.7–4.0)
MCH: 29.4 pg (ref 26.0–34.0)
MCV: 84.7 fL (ref 78.0–100.0)
Monocytes Absolute: 0.3 10*3/uL (ref 0.1–1.0)
Monocytes Relative: 7 % (ref 3–12)
RBC: 4.63 MIL/uL (ref 4.22–5.81)

## 2013-01-30 LAB — LIPID PANEL: Total CHOL/HDL Ratio: 2.8 Ratio

## 2013-01-30 LAB — TSH: TSH: 2.418 u[IU]/mL (ref 0.350–4.500)

## 2013-01-30 MED ORDER — AMLODIPINE BESYLATE 10 MG PO TABS: 10.0000 mg | ORAL_TABLET | Freq: Every day | ORAL | Status: DC

## 2013-01-30 MED ORDER — MELOXICAM 15 MG PO TABS: 15.0000 mg | ORAL_TABLET | Freq: Every day | ORAL | Status: DC

## 2013-01-30 NOTE — Assessment & Plan Note (Signed)
Check labs con't meds 

## 2013-01-30 NOTE — Patient Instructions (Addendum)
Preventive Care for Adults, Male A healthy lifestyle and preventive care can promote health and wellness. Preventive health guidelines for men include the following key practices:  A routine yearly physical is a good way to check with your caregiver about your health and preventative screening. It is a chance to share any concerns and updates on your health, and to receive a thorough exam.  Visit your dentist for a routine exam and preventative care every 6 months. Brush your teeth twice a day and floss once a day. Good oral hygiene prevents tooth decay and gum disease.  The frequency of eye exams is based on your age, health, family medical history, use of contact lenses, and other factors. Follow your caregiver's recommendations for frequency of eye exams.  Eat a healthy diet. Foods like vegetables, fruits, whole grains, low-fat dairy products, and lean protein foods contain the nutrients you need without too many calories. Decrease your intake of foods high in solid fats, added sugars, and salt. Eat the right amount of calories for you.Get information about a proper diet from your caregiver, if necessary.  Regular physical exercise is one of the most important things you can do for your health. Most adults should get at least 150 minutes of moderate-intensity exercise (any activity that increases your heart rate and causes you to sweat) each week. In addition, most adults need muscle-strengthening exercises on 2 or more days a week.  Maintain a healthy weight. The body mass index (BMI) is a screening tool to identify possible weight problems. It provides an estimate of body fat based on height and weight. Your caregiver can help determine your BMI, and can help you achieve or maintain a healthy weight.For adults 20 years and older:  A BMI below 18.5 is considered underweight.  A BMI of 18.5 to 24.9 is normal.  A BMI of 25 to 29.9 is considered overweight.  A BMI of 30 and above is  considered obese.  Maintain normal blood lipids and cholesterol levels by exercising and minimizing your intake of saturated fat. Eat a balanced diet with plenty of fruit and vegetables. Blood tests for lipids and cholesterol should begin at age 20 and be repeated every 5 years. If your lipid or cholesterol levels are high, you are over 50, or you are a high risk for heart disease, you may need your cholesterol levels checked more frequently.Ongoing high lipid and cholesterol levels should be treated with medicines if diet and exercise are not effective.  If you smoke, find out from your caregiver how to quit. If you do not use tobacco, do not start.  If you choose to drink alcohol, do not exceed 2 drinks per day. One drink is considered to be 12 ounces (355 mL) of beer, 5 ounces (148 mL) of wine, or 1.5 ounces (44 mL) of liquor.  Avoid use of street drugs. Do not share needles with anyone. Ask for help if you need support or instructions about stopping the use of drugs.  High blood pressure causes heart disease and increases the risk of stroke. Your blood pressure should be checked at least every 1 to 2 years. Ongoing high blood pressure should be treated with medicines, if weight loss and exercise are not effective.  If you are 45 to 48 years old, ask your caregiver if you should take aspirin to prevent heart disease.  Diabetes screening involves taking a blood sample to check your fasting blood sugar level. This should be done once every 3 years,   after age 45, if you are within normal weight and without risk factors for diabetes. Testing should be considered at a younger age or be carried out more frequently if you are overweight and have at least 1 risk factor for diabetes.  Colorectal cancer can be detected and often prevented. Most routine colorectal cancer screening begins at the age of 50 and continues through age 75. However, your caregiver may recommend screening at an earlier age if you  have risk factors for colon cancer. On a yearly basis, your caregiver may provide home test kits to check for hidden blood in the stool. Use of a small camera at the end of a tube, to directly examine the colon (sigmoidoscopy or colonoscopy), can detect the earliest forms of colorectal cancer. Talk to your caregiver about this at age 50, when routine screening begins. Direct examination of the colon should be repeated every 5 to 10 years through age 75, unless early forms of pre-cancerous polyps or small growths are found.  Hepatitis C blood testing is recommended for all people born from 1945 through 1965 and any individual with known risks for hepatitis C.  Practice safe sex. Use condoms and avoid high-risk sexual practices to reduce the spread of sexually transmitted infections (STIs). STIs include gonorrhea, chlamydia, syphilis, trichomonas, herpes, HPV, and human immunodeficiency virus (HIV). Herpes, HIV, and HPV are viral illnesses that have no cure. They can result in disability, cancer, and death.  A one-time screening for abdominal aortic aneurysm (AAA) and surgical repair of large AAAs by sound wave imaging (ultrasonography) is recommended for ages 65 to 75 years who are current or former smokers.  Healthy men should no longer receive prostate-specific antigen (PSA) blood tests as part of routine cancer screening. Consult with your caregiver about prostate cancer screening.  Testicular cancer screening is not recommended for adult males who have no symptoms. Screening includes self-exam, caregiver exam, and other screening tests. Consult with your caregiver about any symptoms you have or any concerns you have about testicular cancer.  Use sunscreen with skin protection factor (SPF) of 30 or more. Apply sunscreen liberally and repeatedly throughout the day. You should seek shade when your shadow is shorter than you. Protect yourself by wearing long sleeves, pants, a wide-brimmed hat, and  sunglasses year round, whenever you are outdoors.  Once a month, do a whole body skin exam, using a mirror to look at the skin on your back. Notify your caregiver of new moles, moles that have irregular borders, moles that are larger than a pencil eraser, or moles that have changed in shape or color.  Stay current with required immunizations.  Influenza. You need a dose every fall (or winter). The composition of the flu vaccine changes each year, so being vaccinated once is not enough.  Pneumococcal polysaccharide. You need 1 to 2 doses if you smoke cigarettes or if you have certain chronic medical conditions. You need 1 dose at age 65 (or older) if you have never been vaccinated.  Tetanus, diphtheria, pertussis (Tdap, Td). Get 1 dose of Tdap vaccine if you are younger than age 65 years, are over 65 and have contact with an infant, are a healthcare worker, or simply want to be protected from whooping cough. After that, you need a Td booster dose every 10 years. Consult your caregiver if you have not had at least 3 tetanus and diphtheria-containing shots sometime in your life or have a deep or dirty wound.  HPV. This vaccine is recommended   for males 13 through 48 years of age. This vaccine may be given to men 22 through 48 years of age who have not completed the 3 dose series. It is recommended for men through age 26 who have sex with men or whose immune system is weakened because of HIV infection, other illness, or medications. The vaccine is given in 3 doses over 6 months.  Measles, mumps, rubella (MMR). You need at least 1 dose of MMR if you were born in 1957 or later. You may also need a 2nd dose.  Meningococcal. If you are age 19 to 21 years and a first-year college student living in a residence hall, or have one of several medical conditions, you need to get vaccinated against meningococcal disease. You may also need additional booster doses.  Zoster (shingles). If you are age 60 years or  older, you should get this vaccine.  Varicella (chickenpox). If you have never had chickenpox or you were vaccinated but received only 1 dose, talk to your caregiver to find out if you need this vaccine.  Hepatitis A. You need this vaccine if you have a specific risk factor for hepatitis A virus infection, or you simply wish to be protected from this disease. The vaccine is usually given as 2 doses, 6 to 18 months apart.  Hepatitis B. You need this vaccine if you have a specific risk factor for hepatitis B virus infection or you simply wish to be protected from this disease. The vaccine is given in 3 doses, usually over 6 months. Preventative Service / Frequency Ages 19 to 39  Blood pressure check.** / Every 1 to 2 years.  Lipid and cholesterol check.** / Every 5 years beginning at age 20.  Hepatitis C blood test.** / For any individual with known risks for hepatitis C.  Skin self-exam. / Monthly.  Influenza immunization.** / Every year.  Pneumococcal polysaccharide immunization.** / 1 to 2 doses if you smoke cigarettes or if you have certain chronic medical conditions.  Tetanus, diphtheria, pertussis (Tdap,Td) immunization. / A one-time dose of Tdap vaccine. After that, you need a Td booster dose every 10 years.  HPV immunization. / 3 doses over 6 months, if 26 and younger.  Measles, mumps, rubella (MMR) immunization. / You need at least 1 dose of MMR if you were born in 1957 or later. You may also need a 2nd dose.  Meningococcal immunization. / 1 dose if you are age 19 to 21 years and a first-year college student living in a residence hall, or have one of several medical conditions, you need to get vaccinated against meningococcal disease. You may also need additional booster doses.  Varicella immunization.** / Consult your caregiver.  Hepatitis A immunization.** / Consult your caregiver. 2 doses, 6 to 18 months apart.  Hepatitis B immunization.** / Consult your caregiver. 3 doses  usually over 6 months. Ages 40 to 64  Blood pressure check.** / Every 1 to 2 years.  Lipid and cholesterol check.** / Every 5 years beginning at age 20.  Fecal occult blood test (FOBT) of stool. / Every year beginning at age 50 and continuing until age 75. You may not have to do this test if you get colonoscopy every 10 years.  Flexible sigmoidoscopy** or colonoscopy.** / Every 5 years for a flexible sigmoidoscopy or every 10 years for a colonoscopy beginning at age 50 and continuing until age 75.  Hepatitis C blood test.** / For all people born from 1945 through 1965 and any   individual with known risks for hepatitis C.  Skin self-exam. / Monthly.  Influenza immunization.** / Every year.  Pneumococcal polysaccharide immunization.** / 1 to 2 doses if you smoke cigarettes or if you have certain chronic medical conditions.  Tetanus, diphtheria, pertussis (Tdap/Td) immunization.** / A one-time dose of Tdap vaccine. After that, you need a Td booster dose every 10 years.  Measles, mumps, rubella (MMR) immunization. / You need at least 1 dose of MMR if you were born in 1957 or later. You may also need a 2nd dose.  Varicella immunization.**/ Consult your caregiver.  Meningococcal immunization.** / Consult your caregiver.  Hepatitis A immunization.** / Consult your caregiver. 2 doses, 6 to 18 months apart.  Hepatitis B immunization.** / Consult your caregiver. 3 doses, usually over 6 months. Ages 65 and over  Blood pressure check.** / Every 1 to 2 years.  Lipid and cholesterol check.**/ Every 5 years beginning at age 20.  Fecal occult blood test (FOBT) of stool. / Every year beginning at age 50 and continuing until age 75. You may not have to do this test if you get colonoscopy every 10 years.  Flexible sigmoidoscopy** or colonoscopy.** / Every 5 years for a flexible sigmoidoscopy or every 10 years for a colonoscopy beginning at age 50 and continuing until age 75.  Hepatitis C blood  test.** / For all people born from 1945 through 1965 and any individual with known risks for hepatitis C.  Abdominal aortic aneurysm (AAA) screening.** / A one-time screening for ages 65 to 75 years who are current or former smokers.  Skin self-exam. / Monthly.  Influenza immunization.** / Every year.  Pneumococcal polysaccharide immunization.** / 1 dose at age 65 (or older) if you have never been vaccinated.  Tetanus, diphtheria, pertussis (Tdap, Td) immunization. / A one-time dose of Tdap vaccine if you are over 65 and have contact with an infant, are a healthcare worker, or simply want to be protected from whooping cough. After that, you need a Td booster dose every 10 years.  Varicella immunization. ** / Consult your caregiver.  Meningococcal immunization.** / Consult your caregiver.  Hepatitis A immunization. ** / Consult your caregiver. 2 doses, 6 to 18 months apart.  Hepatitis B immunization.** / Check with your caregiver. 3 doses, usually over 6 months. **Family history and personal history of risk and conditions may change your caregiver's recommendations. Document Released: 11/20/2001 Document Revised: 12/17/2011 Document Reviewed: 02/19/2011 Exit[employer] Patient Information 2013 [employer].  

## 2013-01-30 NOTE — Progress Notes (Signed)
Subjective:    Patient ID: Bobby Castaneda, male    DOB: [birth year], 47 y.o.   MRN: [id number]  HPI Pt here for cpe and labs.  Pt c/o knee pain R > L --no known injury but he does play basketball. No other complaints.     Review of Systems Review of Systems  Constitutional: Negative for activity change, appetite change and fatigue.  HENT: Negative for hearing loss, congestion, tinnitus and ear discharge.  dentist q104m Eyes: Negative for visual disturbance (see optho q1y -- vision corrected to 20/20 with glasses).  Respiratory: Negative for cough, chest tightness and shortness of breath.   Cardiovascular: Negative for chest pain, palpitations and leg swelling.  Gastrointestinal: Negative for abdominal pain, diarrhea, constipation and abdominal distention.  Genitourinary: Negative for urgency, frequency, decreased urine volume and difficulty urinating.  Musculoskeletal: Negative for back pain, arthralgias and gait problem.  Skin: Negative for color change, pallor and rash.  Neurological: Negative for dizziness, light-headedness, numbness and headaches.  Hematological: Negative for adenopathy. Does not bruise/bleed easily.  Psychiatric/Behavioral: Negative for suicidal ideas, confusion, sleep disturbance, self-injury, dysphoric mood, decreased concentration and agitation.     Family History  Problem Relation Age of Onset  . Hypertension    . Hypertension Mother   . Hypertension Father   . Hyperlipidemia Father   . Stroke Father   . Cancer Father     prostate  . Hypertension Sister   . Cancer Maternal Aunt 37    breast  . Heart disease Maternal Uncle     mi  . Hypertension Maternal Uncle   . Hypertension Maternal Grandmother   . Alzheimer's disease Maternal Grandmother   . Heart disease Maternal Grandfather     mi  . Hypertension Maternal Grandfather   . Hyperlipidemia Maternal Grandfather   . Stroke Maternal Grandfather   . Stroke Maternal Uncle   . Hypertension Maternal  Uncle    History   Social History  . Marital Status: Married    Spouse Name: N/A    Number of Children: Y  . Years of Education: N/A   Occupational History  . Credit [employer]    Social History Main Topics  . Smoking status: Never Smoker   . Smokeless tobacco: Not on file  . Alcohol Use: No  . Drug Use: No  . Sexually Active: Yes -- Male partner(s)   Other Topics Concern  . Not on file   Social History Narrative   Regular Exercise- qd    Current Outpatient Prescriptions on File Prior to Visit  Medication Sig Dispense Refill  . glucosamine-chondroitin 500-400 MG tablet Take 1 tablet by mouth 2 (two) times daily.      . simvastatin (ZOCOR) 40 MG tablet take 1 tablet by mouth once daily AT BEDTIME  30 tablet  0   No current facility-administered medications on file prior to visit.      Objective:   Physical Exam BP 118/84  Pulse 55  Temp(Src) 98.6 F (37 C) (Oral)  Ht 6' 1.5" (1.867 m)  Wt 222 lb 3.2 oz (100.789 kg)  BMI 28.92 kg/m2  SpO2 97% General appearance: alert, cooperative, appears stated age and no distress Head: Normocephalic, without obvious abnormality, atraumatic Eyes: conjunctivae/corneas clear. PERRL, EOM's intact. Fundi benign. Ears: normal TM's and external ear canals both ears Nose: Nares normal. Septum midline. Mucosa normal. No drainage or sinus tenderness. Throat: lips, mucosa, and tongue normal; teeth and gums normal Neck: no adenopathy, no carotid bruit, no JVD, supple,  symmetrical, trachea midline and thyroid not enlarged, symmetric, no tenderness/mass/nodules Back: symmetric, no curvature. ROM normal. No CVA tenderness. Lungs: clear to auscultation bilaterally Chest wall: no tenderness Heart: regular rate and rhythm, S1, S2 normal, no murmur, click, rub or gallop Abdomen: soft, non-tender; bowel sounds normal; no masses,  no organomegaly Male genitalia: normal Rectal: normal tone, normal prostate, no masses or tenderness and soft  brown guaiac negative stool noted Extremities: extremities normal, atraumatic, no cyanosis or edema Pulses: 2+ and symmetric Skin: Skin color, texture, turgor normal. No rashes or lesions Lymph nodes: Cervical, supraclavicular, and axillary nodes normal. Neurologic: Alert and oriented X 3, normal strength and tone. Normal symmetric reflexes. Normal coordination and gait Psych-- no depression , no anxiety     Assessment & Plan:  cpe-- check labs           ghm utd       See AVS

## 2013-01-31 LAB — MICROALBUMIN / CREATININE URINE RATIO: Microalb Creat Ratio: 10.5 mg/g (ref 0.0–30.0)

## 2013-01-31 LAB — PSA: PSA: 1.83 ng/mL (ref <=4.00)

## 2013-02-06 ENCOUNTER — Other Ambulatory Visit: Payer: Self-pay | Admitting: Family Medicine

## 2013-02-06 DIAGNOSIS — R809 Proteinuria, unspecified: Secondary | ICD-10-CM

## 2013-02-16 ENCOUNTER — Other Ambulatory Visit: Payer: Self-pay | Admitting: *Deleted

## 2013-02-16 MED ORDER — SIMVASTATIN 40 MG PO TABS: ORAL_TABLET | ORAL | Status: DC

## 2013-02-16 NOTE — Telephone Encounter (Signed)
Rx sent 

## 2013-02-17 NOTE — Addendum Note (Signed)
Addended by: Silvio Pate D on: 02/17/2013 04:22 PM   Modules accepted: Orders

## 2013-02-18 ENCOUNTER — Other Ambulatory Visit: Payer: Self-pay | Admitting: Family Medicine

## 2013-02-18 DIAGNOSIS — R809 Proteinuria, unspecified: Secondary | ICD-10-CM

## 2013-02-18 LAB — MICROALBUMIN / CREATININE URINE RATIO: Microalb, Ur: 2.2 mg/dL — ABNORMAL HIGH (ref 0.0–1.9)

## 2013-04-06 ENCOUNTER — Encounter: Payer: Self-pay | Admitting: Family Medicine

## 2013-04-14 ENCOUNTER — Telehealth: Payer: Self-pay | Admitting: Family Medicine

## 2013-04-14 NOTE — Telephone Encounter (Signed)
In reference to Gastroenterology referral entered 01/30/13, patient has not been scheduled.  Patient was contacted multiple times by [facility], myself, and we left messages for a return call.  I also mailed patient a letter.  As of today, 04/14/13, he will not respond.

## 2013-04-14 NOTE — Telephone Encounter (Signed)
noted 

## 2013-05-15 ENCOUNTER — Telehealth: Payer: Self-pay | Admitting: Family Medicine

## 2013-05-15 NOTE — Telephone Encounter (Signed)
We need to change zocor to Lipitor 20 mg #30  1 po qhs, 2 refills but pt will need to be told

## 2013-05-15 NOTE — Telephone Encounter (Signed)
Cheryl with [facility] outpatient pharmacy is calling because she states that there is a drug interaction with the patient's Amlodipine and Simvastatin. Would like a call back to discuss.

## 2013-05-15 NOTE — Telephone Encounter (Signed)
Are you ok with the two medications together?

## 2013-05-18 MED ORDER — ATORVASTATIN CALCIUM 20 MG PO TABS: ORAL_TABLET | ORAL | Status: DC

## 2013-05-18 NOTE — Telephone Encounter (Signed)
Patient notified of change in statin. Sent new Rx to pharmacy.

## 2013-05-19 ENCOUNTER — Telehealth: Payer: Self-pay

## 2013-05-19 ENCOUNTER — Telehealth: Payer: Self-pay | Admitting: Family Medicine

## 2013-05-19 ENCOUNTER — Encounter: Payer: Self-pay | Admitting: Family Medicine

## 2013-05-19 ENCOUNTER — Ambulatory Visit (INDEPENDENT_AMBULATORY_CARE_PROVIDER_SITE_OTHER): Payer: Self-pay | Admitting: Family Medicine

## 2013-05-19 VITALS — BP 122/80 | HR 54 | Temp 98.4°F | Wt 220.0 lb

## 2013-05-19 DIAGNOSIS — G510 Bell's palsy: Secondary | ICD-10-CM

## 2013-05-19 LAB — CBC WITH DIFFERENTIAL/PLATELET
Basophils Absolute: 0 10*3/uL (ref 0.0–0.1)
Eosinophils Absolute: 0.1 10*3/uL (ref 0.0–0.7)
HCT: 40.5 % (ref 39.0–52.0)
Lymphs Abs: 1.9 10*3/uL (ref 0.7–4.0)
MCHC: 33.3 g/dL (ref 30.0–36.0)
MCV: 89.1 fl (ref 78.0–100.0)
Monocytes Absolute: 0.4 10*3/uL (ref 0.1–1.0)
Neutrophils Relative %: 30.9 % — ABNORMAL LOW (ref 43.0–77.0)
Platelets: 257 10*3/uL (ref 150.0–400.0)
RDW: 13.1 % (ref 11.5–14.6)

## 2013-05-19 LAB — BASIC METABOLIC PANEL
BUN: 14 mg/dL (ref 6–23)
Chloride: 104 mEq/L (ref 96–112)
Creatinine, Ser: 1.2 mg/dL (ref 0.4–1.5)
Glucose, Bld: 84 mg/dL (ref 70–99)
Potassium: 4 mEq/L (ref 3.5–5.1)

## 2013-05-19 LAB — HEPATIC FUNCTION PANEL
ALT: 19 U/L (ref 0–53)
Total Bilirubin: 0.7 mg/dL (ref 0.3–1.2)

## 2013-05-19 MED ORDER — PREDNISONE 20 MG PO TABS: ORAL_TABLET | ORAL | Status: DC

## 2013-05-19 MED ORDER — VALACYCLOVIR HCL 1 G PO TABS: 1000.0000 mg | ORAL_TABLET | Freq: Three times a day (TID) | ORAL | Status: DC

## 2013-05-19 NOTE — Assessment & Plan Note (Signed)
Prednisone taper per orders Valtrex Check labs rto 1 months or sooner prn

## 2013-05-19 NOTE — Telephone Encounter (Signed)
Patient was referred back in May to [facility] and has an appointment with them tomorrow. 05/20/13 at Karlene Einstein at [facility] is requesting the patient's OV notes and labs from today 05/19/13 to be faxed to them whenever ready. Fax#: [phone]

## 2013-05-19 NOTE — Telephone Encounter (Signed)
Spoke with CAN Clydie Braun). She states that patient is having facial weakness, difficulty closing right eye, only can drink through a straw. Advised that patient be seen ASAP. Patient will see Dr. Laury Axon at 11:15 today.

## 2013-05-19 NOTE — Progress Notes (Signed)
  Subjective:    Patient ID: Bobby Castaneda, male    DOB: 13-Jan-1966, 47 y.o.   MRN: [id number]  HPI Pt here c/o numbness R side of face with tearing.  Pt is unsure about tick bites. No weakness in ext, no vision changes, no chest pain   Review of Systems     As above Objective:   Physical Exam BP 122/80  Pulse 54  Temp(Src) 98.4 F (36.9 C) (Oral)  Wt 220 lb (99.791 kg)  BMI 28.63 kg/m2  SpO2 97% General appearance: alert, cooperative, appears stated age and no distress Eyes: conjunctivae/corneas clear. PERRL, EOM's intact. Fundi benign., tearing R eye Ears: normal TM's and external ear canals both ears Nose: Nares normal. Septum midline. Mucosa normal. No drainage or sinus tenderness. Throat: lips, mucosa, and tongue normal; teeth and gums normal Neck: no adenopathy, no carotid bruit, no JVD, supple, symmetrical, trachea midline and thyroid not enlarged, symmetric, no tenderness/mass/nodules Lungs: clear to auscultation bilaterally Heart: S1, S2 normal Neurologic: Cranial nerves: II: pupils equal, round, reactive to light and accommodation, VII: upper facial muscle function R facial droop -, VII: lower facial muscle function r facial droop -       Assessment & Plan:

## 2013-05-19 NOTE — Telephone Encounter (Signed)
Patient Information:  Caller Name: Bogdan  Phone: [phone]  Patient: Bobby Castaneda, Bobby Castaneda  Gender: Male  DOB: 07-May-1966  Age: 47 Years  PCP: Lelon Perla.  Office Follow Up:  Does the office need to follow up with this patient?: No  Instructions For The Office: N/A  RN Note:  Patient states he developed weakness and numbness of right side of face, onset 05/18/13. Denies difficulty swallowing. States he needs to use a straw to drink.  Speech intact. Denies weakness, numbness or tingling of arm or leg. Patient states he has difficulty closing his right eye. Denies headache. Patient states he has had sinus congestion, onset 05/14/13. Care advice given per guidelines. Call back parameters reviewed. Patient advised not to drive self. Patient advised to call 911 if he develops weakness, numbness or tingling or extremity. Patient verbalizes understanding. No appts. available until 1630 05/19/13. RN spoke wth Alicia Amel, in office. Gay RN discussed above with Dr. Laury Axon. Orders received: Advise patient to come to office for appt. at 1115 05/19/13.  Patient informed of above. Patient verbalizes understanding.   Symptoms  Reason For Call & Symptoms: Weakness and numbness of right side of face  Reviewed Health History In EMR: Yes  Reviewed Medications In EMR: Yes  Reviewed Allergies In EMR: Yes  Reviewed Surgeries / Procedures: Yes  Date of Onset of Symptoms: 05/18/2013  Guideline(s) Used:  Neurologic Deficit  Disposition Per Guideline:   Go to Office Now  Reason For Disposition Reached:   Bells palsy suspected (i.e., weakness only one side of the face, developing over hours to days, no other symptoms)  Advice Given:  Call Back If:  You become worse.  Patient Will Follow Care Advice:  YES

## 2013-05-19 NOTE — Telephone Encounter (Signed)
Please complete notes so I can fax them to the specialist. Thanks     KP

## 2013-05-19 NOTE — Telephone Encounter (Signed)
todays ov had nothing to do with the kidneys

## 2013-05-19 NOTE — Telephone Encounter (Signed)
Patient has been worked in for today at 11:15.    KP

## 2013-05-19 NOTE — Patient Instructions (Signed)
Bell's Palsy  Bell's palsy is a condition in which the muscles on one side of the face cannot move (paralysis). This is because the nerves in the face are paralyzed. It is most often thought to be caused by a virus. The virus causes swelling of the nerve that controls movement on one side of the face. The nerve travels through a tight space surrounded by bone. When the nerve swells, it can be compressed by the bone. This results in damage to the protective covering around the nerve. This damage interferes with how the nerve communicates with the muscles of the face. As a result, it can cause weakness or paralysis of the facial muscles.   Injury (trauma), tumor, and surgery may cause Bell's palsy, but most of the time the cause is unknown. It is a relatively common condition. It starts suddenly (abrupt onset) with the paralysis usually ending within 2 days. Bell's palsy is not dangerous. But because the eye does not close properly, you may need care to keep the eye from getting dry. This can include splinting (to keep the eye shut) or moistening with artificial tears. Bell's palsy very seldom occurs on both sides of the face at the same time.  SYMPTOMS    Eyebrow sagging.   Drooping of the eyelid and corner of the mouth.   Inability to close one eye.   Loss of taste on the front of the tongue.   Sensitivity to loud noises.  TREATMENT   The treatment is usually non-surgical. If the patient is seen within the first 24 to 48 hours, a short course of steroids may be prescribed, in an attempt to shorten the length of the condition. Antiviral medicines may also be used with the steroids, but it is unclear if they are helpful.   You will need to protect your eye, if you cannot close it. The cornea (clear covering over your eye) will become dry and can be damaged. Artificial tears can be used to keep your eye moist. Glasses or an eye patch should be worn to protect your eye.  PROGNOSIS   Recovery is variable, ranging  from days to months. Although the problem usually goes away completely (about 80% of cases resolve), predicting the outcome is impossible. Most people improve within 3 weeks of when the symptoms began. Improvement may continue for 3 to 6 months. A small number of people have moderate to severe weakness that is permanent.   HOME CARE INSTRUCTIONS    If your caregiver prescribed medication to reduce swelling in the nerve, use as directed. Do not stop taking the medication unless directed by your caregiver.   Use moisturizing eye drops as needed to prevent drying of your eye, as directed by your caregiver.   Protect your eye, as directed by your caregiver.   Use facial massage and exercises, as directed by your caregiver.   Perform your normal activities, and get your normal rest.  SEEK IMMEDIATE MEDICAL CARE IF:    There is pain, redness or irritation in the eye.   You or your child has an oral temperature above 102 F (38.9 C), not controlled by medicine.  MAKE SURE YOU:    Understand these instructions.   Will watch your condition.   Will get help right away if you are not doing well or get worse.  Document Released: 09/24/2005 Document Revised: 12/17/2011 Document Reviewed: 10/03/2009  [employer] Patient Information 2014 [employer].

## 2013-05-20 LAB — B. BURGDORFI ANTIBODIES: B burgdorferi Ab IgG+IgM: 0.6 {ISR}

## 2013-05-20 NOTE — Telephone Encounter (Signed)
I am aware of that but they are asking for today's notes. I do not know why, but they asked for them.    Thanks    KP

## 2013-08-05 ENCOUNTER — Ambulatory Visit (INDEPENDENT_AMBULATORY_CARE_PROVIDER_SITE_OTHER): Payer: Self-pay | Admitting: Family Medicine

## 2013-08-05 ENCOUNTER — Telehealth: Payer: Self-pay | Admitting: *Deleted

## 2013-08-05 ENCOUNTER — Encounter: Payer: Self-pay | Admitting: Family Medicine

## 2013-08-05 VITALS — BP 138/70 | HR 66 | Temp 99.0°F | Wt 221.0 lb

## 2013-08-05 DIAGNOSIS — G510 Bell's palsy: Secondary | ICD-10-CM

## 2013-08-05 DIAGNOSIS — I10 Essential (primary) hypertension: Secondary | ICD-10-CM

## 2013-08-05 MED ORDER — AMLODIPINE BESYLATE 5 MG PO TABS: 5.0000 mg | ORAL_TABLET | Freq: Every day | ORAL | Status: DC

## 2013-08-05 NOTE — Assessment & Plan Note (Signed)
Stable here Pt will call with new meds

## 2013-08-05 NOTE — Telephone Encounter (Signed)
Med list updated

## 2013-08-05 NOTE — Assessment & Plan Note (Signed)
Improving F/u 3 months or sooner Pt wants to hold off on neuro See labs

## 2013-08-05 NOTE — Progress Notes (Signed)
  Subjective:    Patient ID: Bobby Castaneda, male    DOB: [birth year], 47 y.o.   MRN: [id number]  HPI Pt here to f/u bells palsy.  He states his forehead has improved but his mouth has improved only a little bit.  He finished the pred taper.  Pt bp meds were changed by nephrology but he doesn't remember to what.   Review of Systems As above    Objective:   Physical Exam  BP 138/70  Pulse 66  Temp(Src) 99 F (37.2 C) (Oral)  Wt 221 lb (100.245 kg)  BMI 28.76 kg/m2  SpO2 97% General appearance: alert, cooperative, appears stated age and no distress Neurologic: Cranial nerves: VII: upper facial muscle function normal bilaterally, VII: lower facial muscle function facial droop on the right      Assessment & Plan:

## 2013-08-05 NOTE — Telephone Encounter (Signed)
Patient called and stated that he was suppose to call to the office and let us know what medications he was taking.    Atorvastatin-20mg -1 tab po po daily benazepril-10mg - 1/2 tab po daily amlodipine besylate-5mg  po daily  I have already updated the medication in his med list. SW

## 2013-08-05 NOTE — Patient Instructions (Signed)
Bell's Palsy  Bell's palsy is a condition in which the muscles on one side of the face cannot move (paralysis). This is because the nerves in the face are paralyzed. It is most often thought to be caused by a virus. The virus causes swelling of the nerve that controls movement on one side of the face. The nerve travels through a tight space surrounded by bone. When the nerve swells, it can be compressed by the bone. This results in damage to the protective covering around the nerve. This damage interferes with how the nerve communicates with the muscles of the face. As a result, it can cause weakness or paralysis of the facial muscles.   Injury (trauma), tumor, and surgery may cause Bell's palsy, but most of the time the cause is unknown. It is a relatively common condition. It starts suddenly (abrupt onset) with the paralysis usually ending within 2 days. Bell's palsy is not dangerous. But because the eye does not close properly, you may need care to keep the eye from getting dry. This can include splinting (to keep the eye shut) or moistening with artificial tears. Bell's palsy very seldom occurs on both sides of the face at the same time.  SYMPTOMS    Eyebrow sagging.   Drooping of the eyelid and corner of the mouth.   Inability to close one eye.   Loss of taste on the front of the tongue.   Sensitivity to loud noises.  TREATMENT   The treatment is usually non-surgical. If the patient is seen within the first 24 to 48 hours, a short course of steroids may be prescribed, in an attempt to shorten the length of the condition. Antiviral medicines may also be used with the steroids, but it is unclear if they are helpful.   You will need to protect your eye, if you cannot close it. The cornea (clear covering over your eye) will become dry and can be damaged. Artificial tears can be used to keep your eye moist. Glasses or an eye patch should be worn to protect your eye.  PROGNOSIS   Recovery is variable, ranging  from days to months. Although the problem usually goes away completely (about 80% of cases resolve), predicting the outcome is impossible. Most people improve within 3 weeks of when the symptoms began. Improvement may continue for 3 to 6 months. A small number of people have moderate to severe weakness that is permanent.   HOME CARE INSTRUCTIONS    If your caregiver prescribed medication to reduce swelling in the nerve, use as directed. Do not stop taking the medication unless directed by your caregiver.   Use moisturizing eye drops as needed to prevent drying of your eye, as directed by your caregiver.   Protect your eye, as directed by your caregiver.   Use facial massage and exercises, as directed by your caregiver.   Perform your normal activities, and get your normal rest.  SEEK IMMEDIATE MEDICAL CARE IF:    There is pain, redness or irritation in the eye.   You or your child has an oral temperature above 102 F (38.9 C), not controlled by medicine.  MAKE SURE YOU:    Understand these instructions.   Will watch your condition.   Will get help right away if you are not doing well or get worse.  Document Released: 09/24/2005 Document Revised: 12/17/2011 Document Reviewed: 10/03/2009  [employer] Patient Information 2014 [employer].

## 2013-08-10 ENCOUNTER — Telehealth: Payer: Self-pay

## 2013-08-10 ENCOUNTER — Ambulatory Visit (INDEPENDENT_AMBULATORY_CARE_PROVIDER_SITE_OTHER): Payer: Self-pay | Admitting: Family Medicine

## 2013-08-10 VITALS — BP 160/90

## 2013-08-10 DIAGNOSIS — I10 Essential (primary) hypertension: Secondary | ICD-10-CM

## 2013-08-10 MED ORDER — BENAZEPRIL HCL 20 MG PO TABS: 20.0000 mg | ORAL_TABLET | Freq: Every day | ORAL | Status: DC

## 2013-08-10 NOTE — Progress Notes (Signed)
  Subjective:    Patient ID: Bobby Castaneda, male    DOB: 1966/01/14, 47 y.o.   MRN: [id number]  HPI Pt dropped by for bp check only---inc lotensin to 20 mg and f/u 3 weeks for ov   Review of Systems     Objective:   Physical Exam        Assessment & Plan:

## 2013-08-10 NOTE — Telephone Encounter (Signed)
Spoke with patient and he voiced understanding as well as agreed to increase the Lotensin to 20 mg, he has been made aware that the medication has been faxed.         KP

## 2013-08-10 NOTE — Telephone Encounter (Signed)
Message copied by Arnette Norris on Mon Aug 10, 2013  2:07 PM ------      Message from: Lelon Perla      Created: Mon Aug 10, 2013  1:04 PM       Pt needs to increase the lotensin to 20 mg 1 po qd  ------

## 2013-09-18 ENCOUNTER — Other Ambulatory Visit: Payer: Self-pay | Admitting: Family Medicine

## 2013-12-01 ENCOUNTER — Other Ambulatory Visit: Payer: Self-pay | Admitting: Family Medicine

## 2013-12-07 ENCOUNTER — Encounter: Payer: Self-pay | Admitting: Family Medicine

## 2013-12-07 ENCOUNTER — Ambulatory Visit (INDEPENDENT_AMBULATORY_CARE_PROVIDER_SITE_OTHER): Payer: BC Managed Care – PPO | Admitting: Family Medicine

## 2013-12-07 VITALS — BP 138/90 | HR 68 | Temp 98.4°F | Wt 222.0 lb

## 2013-12-07 DIAGNOSIS — G510 Bell's palsy: Secondary | ICD-10-CM

## 2013-12-07 DIAGNOSIS — E785 Hyperlipidemia, unspecified: Secondary | ICD-10-CM

## 2013-12-07 DIAGNOSIS — I10 Essential (primary) hypertension: Secondary | ICD-10-CM

## 2013-12-07 MED ORDER — AMLODIPINE BESYLATE 5 MG PO TABS: 5.0000 mg | ORAL_TABLET | Freq: Every day | ORAL | Status: DC

## 2013-12-07 MED ORDER — BENAZEPRIL HCL 40 MG PO TABS: 40.0000 mg | ORAL_TABLET | Freq: Every day | ORAL | Status: DC

## 2013-12-07 MED ORDER — ATORVASTATIN CALCIUM 20 MG PO TABS: ORAL_TABLET | ORAL | Status: DC

## 2013-12-07 NOTE — Progress Notes (Signed)
Pre visit review using our clinic review tool, if applicable. No additional management support is needed unless otherwise documented below in the visit note. 

## 2013-12-07 NOTE — Patient Instructions (Signed)

## 2013-12-07 NOTE — Progress Notes (Signed)
  Subjective:    Patient here for follow-up of elevated blood pressure.  He is not exercising and is adherent to a low-salt diet.  Blood pressure is not well controlled at home. Cardiac symptoms: none. Patient denies: chest pain, chest pressure/discomfort, claudication, dyspnea, exertional chest pressure/discomfort, fatigue, irregular heart beat, lower extremity edema, near-syncope, orthopnea, palpitations, paroxysmal nocturnal dyspnea, syncope and tachypnea. Cardiovascular risk factors: dyslipidemia, hypertension and male gender. Use of agents associated with hypertension: none. History of target organ damage: none.  Pt also here f/u Bells palsy diagnosed last summer..  Pt symptom still have not resolved.  He sill has tingling in face and facial droop.  The following portions of the patient's history were reviewed and updated as appropriate: allergies, current medications, past family history, past medical history, past social history, past surgical history and problem list.  Review of Systems Pertinent items are noted in HPI.     Objective:    BP 138/90  Pulse 68  Temp(Src) 98.4 F (36.9 C) (Oral)  Wt 222 lb (100.699 kg)  SpO2 98% General appearance: alert, cooperative, appears stated age and no distress Nose: Nares normal. Septum midline. Mucosa normal. No drainage or sinus tenderness. Neck: no adenopathy, supple, symmetrical, trachea midline and thyroid not enlarged, symmetric, no tenderness/mass/nodules Lungs: clear to auscultation bilaterally Heart: S1, S2 normal Extremities: extremities normal, atraumatic, no cyanosis or edema    Assessment:    Hypertension, stage 1 . Evidence of target organ damage: none.    Plan:    Medication: double up on lotensin . Dietary sodium restriction. Regular aerobic exercise. Check blood pressures 2-3  times weekly and record. Follow up: 6 months and as needed.

## 2013-12-08 ENCOUNTER — Other Ambulatory Visit (INDEPENDENT_AMBULATORY_CARE_PROVIDER_SITE_OTHER): Payer: BC Managed Care – PPO

## 2013-12-08 ENCOUNTER — Telehealth: Payer: Self-pay | Admitting: Family Medicine

## 2013-12-08 DIAGNOSIS — E785 Hyperlipidemia, unspecified: Secondary | ICD-10-CM

## 2013-12-08 DIAGNOSIS — I10 Essential (primary) hypertension: Secondary | ICD-10-CM

## 2013-12-08 LAB — BASIC METABOLIC PANEL
BUN: 17 mg/dL (ref 6–23)
CHLORIDE: 104 meq/L (ref 96–112)
CO2: 29 meq/L (ref 19–32)
Calcium: 9.2 mg/dL (ref 8.4–10.5)
Creatinine, Ser: 1.3 mg/dL (ref 0.4–1.5)
GFR: 76.81 mL/min (ref >60.00)
Glucose, Bld: 85 mg/dL (ref 70–99)
Potassium: 3.4 mEq/L — ABNORMAL LOW (ref 3.5–5.1)
Sodium: 139 mEq/L (ref 135–145)

## 2013-12-08 LAB — HEPATIC FUNCTION PANEL
ALBUMIN: 4.2 g/dL (ref 3.5–5.2)
ALT: 18 U/L (ref 0–53)
AST: 23 U/L (ref 0–37)
Alkaline Phosphatase: 65 U/L (ref 39–117)
BILIRUBIN TOTAL: 0.8 mg/dL (ref 0.3–1.2)
Bilirubin, Direct: 0 mg/dL (ref 0.0–0.3)
TOTAL PROTEIN: 7.8 g/dL (ref 6.0–8.3)

## 2013-12-08 LAB — LIPID PANEL
CHOLESTEROL: 167 mg/dL (ref 0–200)
HDL: 49.6 mg/dL (ref >39.00)
LDL Cholesterol: 104 mg/dL — ABNORMAL HIGH (ref 0–99)
TRIGLYCERIDES: 65 mg/dL (ref 0.0–149.0)
Total CHOL/HDL Ratio: 3
VLDL: 13 mg/dL (ref 0.0–40.0)

## 2013-12-08 NOTE — Telephone Encounter (Signed)
Relevant patient education assigned to patient using Emmi. ° °

## 2013-12-09 ENCOUNTER — Telehealth: Payer: Self-pay | Admitting: Family Medicine

## 2013-12-09 NOTE — Telephone Encounter (Signed)
Relevant patient education mailed to patient.  

## 2013-12-30 ENCOUNTER — Ambulatory Visit: Payer: BC Managed Care – PPO | Admitting: Neurology

## 2014-01-01 ENCOUNTER — Ambulatory Visit (INDEPENDENT_AMBULATORY_CARE_PROVIDER_SITE_OTHER): Payer: BC Managed Care – PPO | Admitting: Neurology

## 2014-01-01 ENCOUNTER — Encounter: Payer: Self-pay | Admitting: Neurology

## 2014-01-01 VITALS — BP 120/74 | HR 74 | Temp 98.0°F | Ht 73.0 in | Wt 221.0 lb

## 2014-01-01 DIAGNOSIS — G510 Bell's palsy: Secondary | ICD-10-CM

## 2014-01-01 NOTE — Progress Notes (Signed)
NEUROLOGY CONSULTATION NOTE  Dock Baccam MRN: [id number] DOB: 12/01/1959  Referring provider: Dr. Garnet Koyanagi Primary care provider: Dr. Garnet Koyanagi  Reason for consult:  Bells palsy  Dear Dr Etter Sjogren:  Thank you for your kind referral of Bobby Castaneda for consultation of the above symptoms. Although his history is well known to you, please allow me to reiterate it for the purpose of our medical record. Records and images were personally reviewed where available.  HISTORY OF PRESENT ILLNESS: This is a very pleasant 48 year old right-handed man with a history of hypertension and hyperlipidemia in his usual state of health until he woke up with right-sided facial weakness in August 2014.  He received a course of oral steroids and noted slow improvement in symptoms.  He is now able to lift his eyebrow better and can drink out of cup without drooling.  However he has noted that he still bites inside his right cheek.  Around 4 months after the facial weakness, he started having numbness and tingling over the right cheek.  He also has blurred vision and tearing in the right eye, particularly noticeable when he is playing sports.  He has some fullness in the right ear, no pain.  He denies any involvement of the arms or legs.  He denies any headaches, dizziness, diplopia, dysarthria, dysphagia, neck/back pain, bowel or bladder dysfunction.  There is no family history of similar symptoms.  Laboratory Data:  Laboratory Data: Lab Results  Component Value Date   WBC 3.5* 05/19/2013   HGB 13.5 05/19/2013   HCT 40.5 05/19/2013   MCV 89.1 05/19/2013   PLT 257.0 05/19/2013     Chemistry      Component Value Date/Time   NA 139 12/08/2013 0842   K 3.4* 12/08/2013 0842   CL 104 12/08/2013 0842   CO2 29 12/08/2013 0842   BUN 17 12/08/2013 0842   CREATININE 1.3 12/08/2013 0842   CREATININE 1.20 01/30/2013 1531      Component Value Date/Time   CALCIUM 9.2 12/08/2013 0842   ALKPHOS 65 12/08/2013 0842   AST 23  12/08/2013 0842   ALT 18 12/08/2013 0842   BILITOT 0.8 12/08/2013 0842     B burgdorferi Ab IgG+IgM ISR  0.60   Comments: Antibody to Borrelia burgdorferi not detected.  PAST MEDICAL HISTORY: Past Medical History  Diagnosis Date  . Hypertension   . Hyperlipidemia     PAST SURGICAL HISTORY: Past Surgical History  Procedure Laterality Date  . Knee surgery      bilat  . Adenoidectomy  08/2011    Gore--- gso ent  . Closed reduction hand fracture  12/2010    Right hand    MEDICATIONS: Current Outpatient Prescriptions on File Prior to Visit  Medication Sig Dispense Refill  . amLODipine (NORVASC) 5 MG tablet Take 1 tablet (5 mg total) by mouth daily.  90 tablet  3  . atorvastatin (LIPITOR) 20 MG tablet TAKE 1 TAB BY MOUTH AT BEDTIME--labs due  90 tablet  0  . benazepril (LOTENSIN) 40 MG tablet Take 1 tablet (40 mg total) by mouth daily.  90 tablet  3   No current facility-administered medications on file prior to visit.    ALLERGIES: No Known Allergies  FAMILY HISTORY: Family History  Problem Relation Age of Onset  . Hypertension    . Hypertension Mother   . Hypertension Father   . Hyperlipidemia Father   . Stroke Father   . Cancer Father  prostate  . Hypertension Sister   . Cancer Maternal Aunt 24    breast  . Heart disease Maternal Uncle     mi  . Hypertension Maternal Uncle   . Hypertension Maternal Grandmother   . Alzheimer's disease Maternal Grandmother   . Heart disease Maternal Grandfather     mi  . Hypertension Maternal Grandfather   . Hyperlipidemia Maternal Grandfather   . Stroke Maternal Grandfather   . Stroke Maternal Uncle   . Hypertension Maternal Uncle     SOCIAL HISTORY: History   Social History  . Marital Status: Married    Spouse Name: N/A    Number of Children: Y  . Years of Education: N/A   Occupational History  . [employer]    Social History Main Topics  . Smoking status: Never Smoker   . Smokeless tobacco: Not on  file  . Alcohol Use: No  . Drug Use: No  . Sexual Activity: Yes    Partners: Male   Other Topics Concern  . Not on file   Social History Narrative   Regular Exercise- qd     REVIEW OF SYSTEMS: Constitutional: No fevers, chills, or sweats, no generalized fatigue, change in appetite Eyes: as above Ear, nose and throat: No hearing loss, ear pain, nasal congestion, sore throat Cardiovascular: No chest pain, palpitations Respiratory:  No shortness of breath at rest or with exertion, wheezes GastrointestinaI: No nausea, vomiting, diarrhea, abdominal pain, fecal incontinence Genitourinary:  No dysuria, urinary retention or frequency Musculoskeletal:  No neck pain, back pain Integumentary: No rash, pruritus, skin lesions Neurological: as above Psychiatric: No depression, insomnia, anxiety Endocrine: No palpitations, fatigue, diaphoresis, mood swings, change in appetite, change in weight, increased thirst Hematologic/Lymphatic:  No anemia, purpura, petechiae. Allergic/Immunologic: no itchy/runny eyes, nasal congestion, recent allergic reactions, rashes  PHYSICAL EXAM: Filed Vitals:   01/01/14 0917  BP: 120/74  Pulse: 74  Temp: 98 F (36.7 C)   General: No acute distress HEENT:  Normocephalic/atraumatic, no vesicles in ear canal R Neck: supple, no paraspinal tenderness, full range of motion Back: No paraspinal tenderness Heart: regular rate and rhythm Lungs: Clear to auscultation bilaterally. Vascular: No carotid bruits. Skin/Extremities: No rash, no edema Neurological Exam: Mental status: alert and oriented to person, place, and time, no dysarthria or aphasia, Fund of knowledge is appropriate.  Recent and remote memory are intact.  Attention and concentration are normal.    Able to name objects and repeat phrases. Cranial nerves: CN I: not tested CN II: pupils equal, round and reactive to light, visual fields intact, fundi unremarkable. CN III, IV, VI:  full range of motion,  no nystagmus, no ptosis CN V: facial sensation intact CN VII: moderate movement of right frontalis, complete eye closure with effort, mouth asymmetric on right with maximum effort, note of contracture on right nasolabial fold.  No synkinesis.  House-Brackmann grade III, CN VIII: hearing intact CN IX, X: gag intact, uvula midline CN XI: sternocleidomastoid and trapezius muscles intact CN XII: tongue midline Bulk & Tone: normal, no fasciculations. Motor: 5/5 throughout with no pronator drift. Sensation: Decreased temperature sensation on right LE.  Otherwise intact to light touch, pin, vibration and joint position sense.  No extinction to double simultaneous stimulation.  Romberg test negative Deep Tendon Reflexes: +2 throughout, no ankle clonus Plantar responses: downgoing bilaterally Cerebellar: no incoordination on finger to nose Gait: narrow-based and steady, able to tandem walk adequately. Tremor: none  IMPRESSION: This is a 48 year old  right-handed man with a history of hypertension, hyperlipidemia, and right Bell's palsy since August 2014.  There has been some improvement in his symptoms, however over the past 3 months he started having numbness and tingling on the right lower face. Although abnormal perception can be due to droopy facial muscles, since there has been minimal improvement per patient 7 months after, imaging is recommended with an MRI brain with and without contrast to assess for underlying structural abnormality.  We discussed prognosis, since he is 7 months out, the likelihood for complete recovery at this point is low. We discussed the importance of eye care and risks for corneal abrasions due to dry eye.  He will set up an appointment with his eye doctor and start wearing an eye patch at night.  Continue eye drops.  He will follow-up in 6 months.    Thank you for allowing me to participate in the care of this patient. Please do not hesitate to call for any questions or  concerns.   Ellouise Newer, M.D.

## 2014-01-01 NOTE — Patient Instructions (Signed)
1. MRI brain with and without contrast for facial palsy 2. Schedule visit with eye doctor 3. Follow-up in 6 months

## 2014-01-07 ENCOUNTER — Ambulatory Visit (HOSPITAL_COMMUNITY): Payer: BC Managed Care – PPO

## 2014-01-25 ENCOUNTER — Other Ambulatory Visit: Payer: Self-pay | Admitting: Family Medicine

## 2014-01-25 ENCOUNTER — Telehealth: Payer: Self-pay | Admitting: Family Medicine

## 2014-01-25 DIAGNOSIS — Z0000 Encounter for general adult medical examination without abnormal findings: Secondary | ICD-10-CM

## 2014-01-25 NOTE — Telephone Encounter (Signed)
Please see 04/14/13 phone note  Patient is now requesting to be referred to GI again. He states that he did not have insurance when referral was first placed and now he does have insurance.

## 2014-01-25 NOTE — Telephone Encounter (Signed)
Please advise if referral appropriate or if the patient will need to come back in.     [place]

## 2014-02-16 ENCOUNTER — Telehealth: Payer: Self-pay | Admitting: Family Medicine

## 2014-02-16 NOTE — Telephone Encounter (Signed)
A user error has taken place.

## 2014-02-22 ENCOUNTER — Telehealth: Payer: Self-pay

## 2014-02-22 NOTE — Telephone Encounter (Signed)
Patient confirmed

## 2014-02-23 ENCOUNTER — Encounter: Payer: Self-pay | Admitting: Family Medicine

## 2014-02-23 ENCOUNTER — Ambulatory Visit (INDEPENDENT_AMBULATORY_CARE_PROVIDER_SITE_OTHER): Payer: BC Managed Care – PPO | Admitting: Family Medicine

## 2014-02-23 VITALS — BP 130/84 | HR 60 | Temp 98.3°F | Ht 71.0 in | Wt 222.0 lb

## 2014-02-23 DIAGNOSIS — I10 Essential (primary) hypertension: Secondary | ICD-10-CM

## 2014-02-23 DIAGNOSIS — E669 Obesity, unspecified: Secondary | ICD-10-CM | POA: Insufficient documentation

## 2014-02-23 DIAGNOSIS — E785 Hyperlipidemia, unspecified: Secondary | ICD-10-CM

## 2014-02-23 DIAGNOSIS — Z0000 Encounter for general adult medical examination without abnormal findings: Secondary | ICD-10-CM

## 2014-02-23 LAB — CBC WITH DIFFERENTIAL/PLATELET
Basophils Absolute: 0 10*3/uL (ref 0.0–0.1)
Basophils Relative: 0.7 % (ref 0.0–3.0)
EOS ABS: 0.1 10*3/uL (ref 0.0–0.7)
Eosinophils Relative: 3.3 % (ref 0.0–5.0)
HCT: 39.9 % (ref 39.0–52.0)
Hemoglobin: 13.4 g/dL (ref 13.0–17.0)
Lymphocytes Relative: 44.5 % (ref 12.0–46.0)
Lymphs Abs: 1.7 10*3/uL (ref 0.7–4.0)
MCHC: 33.5 g/dL (ref 30.0–36.0)
MCV: 89.1 fl (ref 78.0–100.0)
MONO ABS: 0.3 10*3/uL (ref 0.1–1.0)
Monocytes Relative: 8.7 % (ref 3.0–12.0)
Neutro Abs: 1.7 10*3/uL (ref 1.4–7.7)
Neutrophils Relative %: 42.8 % — ABNORMAL LOW (ref 43.0–77.0)
PLATELETS: 239 10*3/uL (ref 150.0–400.0)
RBC: 4.48 Mil/uL (ref 4.22–5.81)
RDW: 13.2 % (ref 11.5–15.5)
WBC: 3.9 10*3/uL — ABNORMAL LOW (ref 4.0–10.5)

## 2014-02-23 LAB — BASIC METABOLIC PANEL
BUN: 15 mg/dL (ref 6–23)
CHLORIDE: 103 meq/L (ref 96–112)
CO2: 31 meq/L (ref 19–32)
Calcium: 9.6 mg/dL (ref 8.4–10.5)
Creatinine, Ser: 1.2 mg/dL (ref 0.4–1.5)
GFR: 82.68 mL/min (ref >60.00)
GLUCOSE: 85 mg/dL (ref 70–99)
Potassium: 3.7 mEq/L (ref 3.5–5.1)
Sodium: 141 mEq/L (ref 135–145)

## 2014-02-23 LAB — HEPATIC FUNCTION PANEL
ALT: 22 U/L (ref 0–53)
AST: 29 U/L (ref 0–37)
Albumin: 4.4 g/dL (ref 3.5–5.2)
Alkaline Phosphatase: 70 U/L (ref 39–117)
BILIRUBIN DIRECT: 0.1 mg/dL (ref 0.0–0.3)
BILIRUBIN TOTAL: 0.8 mg/dL (ref 0.2–1.2)
TOTAL PROTEIN: 7.6 g/dL (ref 6.0–8.3)

## 2014-02-23 LAB — POCT URINALYSIS DIPSTICK
Bilirubin, UA: NEGATIVE
Blood, UA: NEGATIVE
Glucose, UA: NEGATIVE
KETONES UA: NEGATIVE
Leukocytes, UA: NEGATIVE
NITRITE UA: NEGATIVE
PH UA: 5
PROTEIN UA: NEGATIVE
Spec Grav, UA: 1.01
UROBILINOGEN UA: 0.2

## 2014-02-23 LAB — LIPID PANEL
CHOL/HDL RATIO: 3
Cholesterol: 146 mg/dL (ref 0–200)
HDL: 48.9 mg/dL (ref >39.00)
LDL Cholesterol: 86 mg/dL (ref 0–99)
Triglycerides: 57 mg/dL (ref 0.0–149.0)
VLDL: 11.4 mg/dL (ref 0.0–40.0)

## 2014-02-23 LAB — PSA: PSA: 2.42 ng/mL (ref 0.10–4.00)

## 2014-02-23 LAB — TSH: TSH: 1.36 u[IU]/mL (ref 0.35–4.50)

## 2014-02-23 MED ORDER — BENAZEPRIL HCL 40 MG PO TABS: 40.0000 mg | ORAL_TABLET | Freq: Every day | ORAL | Status: DC

## 2014-02-23 MED ORDER — AMLODIPINE BESYLATE 5 MG PO TABS: 5.0000 mg | ORAL_TABLET | Freq: Every day | ORAL | Status: DC

## 2014-02-23 MED ORDER — ATORVASTATIN CALCIUM 20 MG PO TABS: ORAL_TABLET | ORAL | Status: DC

## 2014-02-23 NOTE — Patient Instructions (Signed)
Preventive Care for Adults, Male A healthy lifestyle and preventive care can promote health and wellness. Preventive health guidelines for men include the following key practices:  A routine yearly physical is a good way to check with your health care provider about your health and preventative screening. It is a chance to share any concerns and updates on your health and to receive a thorough exam.  Visit your dentist for a routine exam and preventative care every 6 months. Brush your teeth twice a day and floss once a day. Good oral hygiene prevents tooth decay and gum disease.  The frequency of eye exams is based on your age, health, family medical history, use of contact lenses, and other factors. Follow your health care provider's recommendations for frequency of eye exams.  Eat a healthy diet. Foods such as vegetables, fruits, whole grains, low-fat dairy products, and lean protein foods contain the nutrients you need without too many calories. Decrease your intake of foods high in solid fats, added sugars, and salt. Eat the right amount of calories for you.Get information about a proper diet from your health care provider, if necessary.  Regular physical exercise is one of the most important things you can do for your health. Most adults should get at least 150 minutes of moderate-intensity exercise (any activity that increases your heart rate and causes you to sweat) each week. In addition, most adults need muscle-strengthening exercises on 2 or more days a week.  Maintain a healthy weight. The body mass index (BMI) is a screening tool to identify possible weight problems. It provides an estimate of body fat based on height and weight. Your health care provider can find your BMI and can help you achieve or maintain a healthy weight.For adults 20 years and older:  A BMI below 18.5 is considered underweight.  A BMI of 18.5 to 24.9 is normal.  A BMI of 25 to 29.9 is considered  overweight.  A BMI of 30 and above is considered obese.  Maintain normal blood lipids and cholesterol levels by exercising and minimizing your intake of saturated fat. Eat a balanced diet with plenty of fruit and vegetables. Blood tests for lipids and cholesterol should begin at age 42 and be repeated every 5 years. If your lipid or cholesterol levels are high, you are over 50, or you are at high risk for heart disease, you may need your cholesterol levels checked more frequently.Ongoing high lipid and cholesterol levels should be treated with medicines if diet and exercise are not working.  If you smoke, find out from your health care provider how to quit. If you do not use tobacco, do not start.  Lung cancer screening is recommended for adults aged 24 80 years who are at high risk for developing lung cancer because of a history of smoking. A yearly low-dose CT scan of the lungs is recommended for people who have at least a 30-pack-year history of smoking and are a current smoker or have quit within the past 15 years. A pack year of smoking is smoking an average of 1 pack of cigarettes a day for 1 year (for example: 1 pack a day for 30 years or 2 packs a day for 15 years). Yearly screening should continue until the smoker has stopped smoking for at least 15 years. Yearly screening should be stopped for people who develop a health problem that would prevent them from having lung cancer treatment.  If you choose to drink alcohol, do not have  more than 2 drinks per day. One drink is considered to be 12 ounces (355 mL) of beer, 5 ounces (148 mL) of wine, or 1.5 ounces (44 mL) of liquor.  Avoid use of street drugs. Do not share needles with anyone. Ask for help if you need support or instructions about stopping the use of drugs.  High blood pressure causes heart disease and increases the risk of stroke. Your blood pressure should be checked at least every 1 2 years. Ongoing high blood pressure should be  treated with medicines, if weight loss and exercise are not effective.  If you are 75 49 years old, ask your health care provider if you should take aspirin to prevent heart disease.  Diabetes screening involves taking a blood sample to check your fasting blood sugar level. This should be done once every 3 years, after age 19, if you are within normal weight and without risk factors for diabetes. Testing should be considered at a younger age or be carried out more frequently if you are overweight and have at least 1 risk factor for diabetes.  Colorectal cancer can be detected and often prevented. Most routine colorectal cancer screening begins at the age of 47 and continues through age 80. However, your health care provider may recommend screening at an earlier age if you have risk factors for colon cancer. On a yearly basis, your health care provider may provide home test kits to check for hidden blood in the stool. Use of a small camera at the end of a tube to directly examine the colon (sigmoidoscopy or colonoscopy) can detect the earliest forms of colorectal cancer. Talk to your health care provider about this at age 66, when routine screening begins. Direct exam of the colon should be repeated every 5 10 years through age 19, unless early forms of precancerous polyps or small growths are found.  People who are at an increased risk for hepatitis B should be screened for this virus. You are considered at high risk for hepatitis B if:  You were born in a country where hepatitis B occurs often. Talk with your health care provider about which countries are considered high-risk.  Your parents were born in a high-risk country and you have not received a shot to protect against hepatitis B (hepatitis B vaccine).  You have HIV or AIDS.  You use needles to inject street drugs.  You live with, or have sex with, someone who has hepatitis B.  You are a man who has sex with other men (MSM).  You get  hemodialysis treatment.  You take certain medicines for conditions such as cancer, organ transplantation, and autoimmune conditions.  Hepatitis C blood testing is recommended for all people born from [birth year] through 1965 and any individual with known risks for hepatitis C.  Practice safe sex. Use condoms and avoid high-risk sexual practices to reduce the spread of sexually transmitted infections (STIs). STIs include gonorrhea, chlamydia, syphilis, trichomonas, herpes, HPV, and human immunodeficiency virus (HIV). Herpes, HIV, and HPV are viral illnesses that have no cure. They can result in disability, cancer, and death.  A one-time screening for abdominal aortic aneurysm (AAA) and surgical repair of large AAAs by ultrasound are recommended for men ages 94 to 74 years who are current or former smokers.  Healthy men should no longer receive prostate-specific antigen (PSA) blood tests as part of routine cancer screening. Talk with your health care provider about prostate cancer screening.  Testicular cancer screening is not recommended  for adult males who have no symptoms. Screening includes self-exam, a health care provider exam, and other screening tests. Consult with your health care provider about any symptoms you have or any concerns you have about testicular cancer.  Use sunscreen. Apply sunscreen liberally and repeatedly throughout the day. You should seek shade when your shadow is shorter than you. Protect yourself by wearing long sleeves, pants, a wide-brimmed hat, and sunglasses year round, whenever you are outdoors.  Once a month, do a whole-body skin exam, using a mirror to look at the skin on your back. Tell your health care provider about new moles, moles that have irregular borders, moles that are larger than a pencil eraser, or moles that have changed in shape or color.  Stay current with required vaccines (immunizations).  Influenza vaccine. All adults should be immunized every  year.  Tetanus, diphtheria, and acellular pertussis (Td, Tdap) vaccine. An adult who has not previously received Tdap or who does not know his vaccine status should receive 1 dose of Tdap. This initial dose should be followed by tetanus and diphtheria toxoids (Td) booster doses every 10 years. Adults with an unknown or incomplete history of completing a 3-dose immunization series with Td-containing vaccines should begin or complete a primary immunization series including a Tdap dose. Adults should receive a Td booster every 10 years.  Varicella vaccine. An adult without evidence of immunity to varicella should receive 2 doses or a second dose if he has previously received 1 dose.  Human papillomavirus (HPV) vaccine. Males aged 44 21 years who have not received the vaccine previously should receive the 3-dose series. Males aged 43 26 years may be immunized. Immunization is recommended through the age of 50 years for any male who has sex with males and did not get any or all doses earlier. Immunization is recommended for any person with an immunocompromised condition through the age of 23 years if he did not get any or all doses earlier. During the 3-dose series, the second dose should be obtained 4 8 weeks after the first dose. The third dose should be obtained 24 weeks after the first dose and 16 weeks after the second dose.  Zoster vaccine. One dose is recommended for adults aged 96 years or older unless certain conditions are present.  Measles, mumps, and rubella (MMR) vaccine. Adults born before 55 generally are considered immune to measles and mumps. Adults born in [birth year] or later should have 1 or more doses of MMR vaccine unless there is a contraindication to the vaccine or there is laboratory evidence of immunity to each of the three diseases. A routine second dose of MMR vaccine should be obtained at least 28 days after the first dose for students attending postsecondary schools, health care  workers, or international travelers. People who received inactivated measles vaccine or an unknown type of measles vaccine during 1963 1967 should receive 2 doses of MMR vaccine. People who received inactivated mumps vaccine or an unknown type of mumps vaccine before 1979 and are at high risk for mumps infection should consider immunization with 2 doses of MMR vaccine. Unvaccinated health care workers born before [birth year] who lack laboratory evidence of measles, mumps, or rubella immunity or laboratory confirmation of disease should consider measles and mumps immunization with 2 doses of MMR vaccine or rubella immunization with 1 dose of MMR vaccine.  Pneumococcal 13-valent conjugate (PCV13) vaccine. When indicated, a person who is uncertain of his immunization history and has no record of immunization  should receive the PCV13 vaccine. An adult aged 67 years or older who has certain medical conditions and has not been previously immunized should receive 1 dose of PCV13 vaccine. This PCV13 should be followed with a dose of pneumococcal polysaccharide (PPSV23) vaccine. The PPSV23 vaccine dose should be obtained at least 8 weeks after the dose of PCV13 vaccine. An adult aged 79 years or older who has certain medical conditions and previously received 1 or more doses of PPSV23 vaccine should receive 1 dose of PCV13. The PCV13 vaccine dose should be obtained 1 or more years after the last PPSV23 vaccine dose.  Pneumococcal polysaccharide (PPSV23) vaccine. When PCV13 is also indicated, PCV13 should be obtained first. All adults aged 74 years and older should be immunized. An adult younger than age 50 years who has certain medical conditions should be immunized. Any person who resides in a nursing home or long-term care facility should be immunized. An adult smoker should be immunized. People with an immunocompromised condition and certain other conditions should receive both PCV13 and PPSV23 vaccines. People with human  immunodeficiency virus (HIV) infection should be immunized as soon as possible after diagnosis. Immunization during chemotherapy or radiation therapy should be avoided. Routine use of PPSV23 vaccine is not recommended for American Indians, [place] Natives, or people younger than 65 years unless there are medical conditions that require PPSV23 vaccine. When indicated, people who have unknown immunization and have no record of immunization should receive PPSV23 vaccine. One-time revaccination 5 years after the first dose of PPSV23 is recommended for people aged 41 64 years who have chronic kidney failure, nephrotic syndrome, asplenia, or immunocompromised conditions. People who received 1 2 doses of PPSV23 before age 15 years should receive another dose of PPSV23 vaccine at age 48 years or later if at least 5 years have passed since the previous dose. Doses of PPSV23 are not needed for people immunized with PPSV23 at or after age 69 years.  Meningococcal vaccine. Adults with asplenia or persistent complement component deficiencies should receive 2 doses of quadrivalent meningococcal conjugate (MenACWY-D) vaccine. The doses should be obtained at least 2 months apart. Microbiologists working with certain meningococcal bacteria, [place] recruits, people at risk during an outbreak, and people who travel to or live in countries with a high rate of meningitis should be immunized. A first-year college student up through age 7 years who is living in a residence hall should receive a dose if he did not receive a dose on or after his 16th birthday. Adults who have certain high-risk conditions should receive one or more doses of vaccine.  Hepatitis A vaccine. Adults who wish to be protected from this disease, have certain high-risk conditions, work with hepatitis A-infected animals, work in hepatitis A research labs, or travel to or work in countries with a high rate of hepatitis A should be immunized. Adults who were  previously unvaccinated and who anticipate close contact with an international adoptee during the first 60 days after arrival in the [place] from a country with a high rate of hepatitis A should be immunized.  Hepatitis B vaccine. Adults who wish to be protected from this disease, have certain high-risk conditions, may be exposed to blood or other infectious body fluids, are household contacts or sex partners of hepatitis B positive people, are clients or workers in certain care facilities, or travel to or work in countries with a high rate of hepatitis B should be immunized.  Haemophilus influenzae type b (Hib) vaccine. A  previously unvaccinated person with asplenia or sickle cell disease or having a scheduled splenectomy should receive 1 dose of Hib vaccine. Regardless of previous immunization, a recipient of a hematopoietic stem cell transplant should receive a 3-dose series 6 12 months after his successful transplant. Hib vaccine is not recommended for adults with HIV infection. Preventive Service / Frequency Ages 62 to 3  Blood pressure check.** / Every 1 to 2 years.  Lipid and cholesterol check.** / Every 5 years beginning at age 43.  Hepatitis C blood test.** / For any individual with known risks for hepatitis C.  Skin self-exam. / Monthly.  Influenza vaccine. / Every year.  Tetanus, diphtheria, and acellular pertussis (Tdap, Td) vaccine.** / Consult your health care provider. 1 dose of Td every 10 years.  Varicella vaccine.** / Consult your health care provider.  HPV vaccine. / 3 doses over 6 months, if 48 or younger.  Measles, mumps, rubella (MMR) vaccine.** / You need at least 1 dose of MMR if you were born in 1957 or later. You may also need a second dose.  Pneumococcal 13-valent conjugate (PCV13) vaccine.** / Consult your health care provider.  Pneumococcal polysaccharide (PPSV23) vaccine.** / 1 to 2 doses if you smoke cigarettes or if you have certain  conditions.  Meningococcal vaccine.** / 1 dose if you are age 8 to 70 years and a Market researcher living in a residence hall, or have one of several medical conditions. You may also need additional booster doses.  Hepatitis A vaccine.** / Consult your health care provider.  Hepatitis B vaccine.** / Consult your health care provider.  Haemophilus influenzae type b (Hib) vaccine.** / Consult your health care provider. Ages 48 to 32  Blood pressure check.** / Every 1 to 2 years.  Lipid and cholesterol check.** / Every 5 years beginning at age 38.  Lung cancer screening. / Every year if you are aged 40 80 years and have a 30-pack-year history of smoking and currently smoke or have quit within the past 15 years. Yearly screening is stopped once you have quit smoking for at least 15 years or develop a health problem that would prevent you from having lung cancer treatment.  Fecal occult blood test (FOBT) of stool. / Every year beginning at age 4 and continuing until age 70. You may not have to do this test if you get a colonoscopy every 10 years.  Flexible sigmoidoscopy** or colonoscopy.** / Every 5 years for a flexible sigmoidoscopy or every 10 years for a colonoscopy beginning at age 76 and continuing until age 62.  Hepatitis C blood test.** / For all people born from [birth year] through 1965 and any individual with known risks for hepatitis C.  Skin self-exam. / Monthly.  Influenza vaccine. / Every year.  Tetanus, diphtheria, and acellular pertussis (Tdap/Td) vaccine.** / Consult your health care provider. 1 dose of Td every 10 years.  Varicella vaccine.** / Consult your health care provider.  Zoster vaccine.** / 1 dose for adults aged 60 years or older.  Measles, mumps, rubella (MMR) vaccine.** / You need at least 1 dose of MMR if you were born in 1957 or later. You may also need a second dose.  Pneumococcal 13-valent conjugate (PCV13) vaccine.** / Consult your health care  provider.  Pneumococcal polysaccharide (PPSV23) vaccine.** / 1 to 2 doses if you smoke cigarettes or if you have certain conditions.  Meningococcal vaccine.** / Consult your health care provider.  Hepatitis A vaccine.** / Consult your health care  provider.  Hepatitis B vaccine.** / Consult your health care provider.  Haemophilus influenzae type b (Hib) vaccine.** / Consult your health care provider. Ages 65 and over  Blood pressure check.** / Every 1 to 2 years.  Lipid and cholesterol check.**/ Every 5 years beginning at age 20.  Lung cancer screening. / Every year if you are aged 55 80 years and have a 30-pack-year history of smoking and currently smoke or have quit within the past 15 years. Yearly screening is stopped once you have quit smoking for at least 15 years or develop a health problem that would prevent you from having lung cancer treatment.  Fecal occult blood test (FOBT) of stool. / Every year beginning at age 50 and continuing until age 75. You may not have to do this test if you get a colonoscopy every 10 years.  Flexible sigmoidoscopy** or colonoscopy.** / Every 5 years for a flexible sigmoidoscopy or every 10 years for a colonoscopy beginning at age 50 and continuing until age 75.  Hepatitis C blood test.** / For all people born from 1945 through 1965 and any individual with known risks for hepatitis C.  Abdominal aortic aneurysm (AAA) screening.** / A one-time screening for ages 65 to 75 years who are current or former smokers.  Skin self-exam. / Monthly.  Influenza vaccine. / Every year.  Tetanus, diphtheria, and acellular pertussis (Tdap/Td) vaccine.** / 1 dose of Td every 10 years.  Varicella vaccine.** / Consult your health care provider.  Zoster vaccine.** / 1 dose for adults aged 60 years or older.  Pneumococcal 13-valent conjugate (PCV13) vaccine.** / Consult your health care provider.  Pneumococcal polysaccharide (PPSV23) vaccine.** / 1 dose for all  adults aged 65 years and older.  Meningococcal vaccine.** / Consult your health care provider.  Hepatitis A vaccine.** / Consult your health care provider.  Hepatitis B vaccine.** / Consult your health care provider.  Haemophilus influenzae type b (Hib) vaccine.** / Consult your health care provider. **Family history and personal history of risk and conditions may change your health care provider's recommendations. Document Released: 11/20/2001 Document Revised: 07/15/2013 Document Reviewed: 02/19/2011 [employer] Patient Information 2014 [employer].  

## 2014-02-23 NOTE — Progress Notes (Signed)
Subjective:    Patient ID: Bobby Castaneda, male    DOB: [birth year], 48 y.o.   MRN: [id number]  HPI Pt here for cpe and labs.  No complaints.    Review of Systems  Constitutional: Negative.   HENT: Negative for congestion, ear pain, hearing loss, nosebleeds, postnasal drip, rhinorrhea, sinus pressure, sneezing and tinnitus.   Eyes: Negative for photophobia, discharge, itching and visual disturbance.  Respiratory: Negative.   Cardiovascular: Negative.   Gastrointestinal: Negative for abdominal pain, constipation, blood in stool, abdominal distention and anal bleeding.  Endocrine: Negative.   Genitourinary: Negative.   Musculoskeletal: Negative.   Skin: Negative.   Allergic/Immunologic: Negative.   Neurological: Negative for dizziness, weakness, light-headedness, numbness and headaches.  Psychiatric/Behavioral: Negative for suicidal ideas, confusion, sleep disturbance, dysphoric mood, decreased concentration and agitation. The patient is not nervous/anxious.    Past Medical History  Diagnosis Date  . Hypertension   . Hyperlipidemia    Past Surgical History  Procedure Laterality Date  . Knee surgery      bilat  . Adenoidectomy  08/2011    Gore--- gso ent  . Closed reduction hand fracture  12/2010    Right hand   Current Outpatient Prescriptions  Medication Sig Dispense Refill  . amLODipine (NORVASC) 5 MG tablet Take 1 tablet (5 mg total) by mouth daily.  90 tablet  3  . atorvastatin (LIPITOR) 20 MG tablet TAKE 1 TAB BY MOUTH AT BEDTIME-  90 tablet  3  . benazepril (LOTENSIN) 40 MG tablet Take 1 tablet (40 mg total) by mouth daily.  90 tablet  3   No current facility-administered medications for this visit.   Family History  Problem Relation Age of Onset  . Hypertension    . Hypertension Mother   . Cancer Mother 36    breast  . Hypertension Father   . Hyperlipidemia Father   . Stroke Father   . Cancer Father     prostate  . Hypertension Sister   . Cancer Maternal  Aunt 50    breast  . Heart disease Maternal Uncle     mi  . Hypertension Maternal Uncle   . Hypertension Maternal Grandmother   . Alzheimer's disease Maternal Grandmother   . Heart disease Maternal Grandfather     mi  . Hypertension Maternal Grandfather   . Hyperlipidemia Maternal Grandfather   . Stroke Maternal Grandfather   . Stroke Maternal Uncle   . Hypertension Maternal Uncle    No Known Allergies     Objective:   Physical Exam  Constitutional: He is oriented to person, place, and time. He appears well-developed and well-nourished. No distress.  HENT:  Head: Normocephalic and atraumatic.  Right Ear: External ear normal.  Left Ear: External ear normal.  Nose: Nose normal.  Mouth/Throat: Oropharynx is clear and moist. No oropharyngeal exudate.  Eyes: Conjunctivae and EOM are normal. Pupils are equal, round, and reactive to light. Right eye exhibits no discharge. Left eye exhibits no discharge.  Neck: Normal range of motion. Neck supple. No JVD present. No thyromegaly present.  Cardiovascular: Normal rate, regular rhythm and intact distal pulses.  Exam reveals no gallop and no friction rub.   No murmur heard. Pulmonary/Chest: Effort normal and breath sounds normal. No respiratory distress. He has no wheezes. He has no rales. He exhibits no tenderness.  Abdominal: Soft. Bowel sounds are normal. He exhibits no distension and no mass. There is no tenderness. There is no rebound and no guarding.  Genitourinary:  Rectum normal, prostate normal and penis normal. Guaiac negative stool.  Musculoskeletal: Normal range of motion. He exhibits no edema and no tenderness.  Lymphadenopathy:    He has no cervical adenopathy.  Neurological: He is alert and oriented to person, place, and time. He displays normal reflexes. He exhibits normal muscle tone.  Skin: Skin is warm and dry. No rash noted. He is not diaphoretic. No erythema. No pallor.  Psychiatric: He has a normal mood and affect. His  behavior is normal. Judgment and thought content normal.          Assessment & Plan:  1. Preventative health care ghm utd Check labs - Basic metabolic panel - CBC with Differential - Hepatic function panel - Lipid panel - POCT urinalysis dipstick - PSA - TSH - Quantiferon tb gold assay  2. Hyperlipidemia Check labs  - Hepatic function panel - Lipid panel - POCT urinalysis dipstick - PSA - TSH  3. HTN (hypertension) stable - Basic metabolic panel - CBC with Differential - POCT urinalysis dipstick - PSA - TSH - amLODipine (NORVASC) 5 MG tablet; Take 1 tablet (5 mg total) by mouth daily.  Dispense: 90 tablet; Refill: 3 - benazepril (LOTENSIN) 40 MG tablet; Take 1 tablet (40 mg total) by mouth daily.  Dispense: 90 tablet; Refill: 3  4. Other and unspecified hyperlipidemia Check labs - atorvastatin (LIPITOR) 20 MG tablet; TAKE 1 TAB BY MOUTH AT BEDTIME-  Dispense: 90 tablet; Refill: 3

## 2014-02-23 NOTE — Progress Notes (Signed)
Pre visit review using our clinic review tool, if applicable. No additional management support is needed unless otherwise documented below in the visit note. 

## 2014-02-25 LAB — QUANTIFERON TB GOLD ASSAY (BLOOD)
Interferon Gamma Release Assay: NEGATIVE
Mitogen value: >10 IU/mL
QUANTIFERON TB AG MINUS NIL: 0.03 [IU]/mL
Quantiferon Nil Value: 0.1 IU/mL
TB AG VALUE: 0.13 [IU]/mL

## 2014-03-04 ENCOUNTER — Ambulatory Visit (AMBULATORY_SURGERY_CENTER): Payer: Self-pay

## 2014-03-04 VITALS — Ht 73.0 in | Wt 221.0 lb

## 2014-03-04 DIAGNOSIS — Z800 Family history of malignant neoplasm of digestive organs: Secondary | ICD-10-CM

## 2014-03-04 MED ORDER — MOVIPREP 100 G PO SOLR: 1.0000 | Freq: Once | ORAL | Status: DC

## 2014-03-04 NOTE — Progress Notes (Signed)
No allergies to eggs or soy No diet/weight loss meds No home oxygen CPAP only No past problems with anesthesia Has email  Emmi instructions given for colonoscopy

## 2014-03-08 ENCOUNTER — Encounter: Payer: Self-pay | Admitting: Internal Medicine

## 2014-03-18 ENCOUNTER — Encounter: Payer: Self-pay | Admitting: Internal Medicine

## 2014-03-18 ENCOUNTER — Ambulatory Visit (AMBULATORY_SURGERY_CENTER): Payer: BC Managed Care – PPO | Admitting: Internal Medicine

## 2014-03-18 VITALS — BP 122/69 | HR 52 | Temp 96.8°F | Resp 24 | Ht 73.0 in | Wt 221.0 lb

## 2014-03-18 DIAGNOSIS — D126 Benign neoplasm of colon, unspecified: Secondary | ICD-10-CM

## 2014-03-18 DIAGNOSIS — Z800 Family history of malignant neoplasm of digestive organs: Secondary | ICD-10-CM

## 2014-03-18 MED ORDER — SODIUM CHLORIDE 0.9 % IV SOLN: 500.0000 mL | INTRAVENOUS | Status: DC

## 2014-03-18 NOTE — Progress Notes (Signed)
Report to PACU, RN, vss, BBS= Clear.  

## 2014-03-18 NOTE — Op Note (Signed)
[place]  [address]. [place], [id number]   COLONOSCOPY PROCEDURE REPORT  PATIENT: Bobby Castaneda, Bobby Castaneda  MR#: [id number] BIRTHDATE: 1966-04-17 , 48  yrs. old GENDER: Male ENDOSCOPIST: Eustace Quail, MD REFERRED [id number] Yale, DO PROCEDURE DATE:  03/18/2014 PROCEDURE:   Colonoscopy with snare polypectomy x 3 First Screening Colonoscopy - Avg.  risk and is 50 yrs.  old or older - No.  Prior Negative Screening - Now for repeat screening. N/A  History of Adenoma - Now for follow-up colonoscopy & has been > or = to 3 yrs.  N/A  Polyps Removed Today? Yes. ASA CLASS:   Class II INDICATIONS:Patient's immediate family history of colon cancer. Father about 21 MEDICATIONS: MAC sedation, administered by CRNA and propofol (Diprivan) 500mg  IV  DESCRIPTION OF PROCEDURE:   After the risks benefits and alternatives of the procedure were thoroughly explained, informed consent was obtained.  A digital rectal exam revealed no abnormalities of the rectum.   The LB [id number]  endoscope was introduced through the anus and advanced to the cecum, which was identified by both the appendix and ileocecal valve. No adverse events experienced.   The quality of the prep was adequate, using MoviPrep  The instrument was then slowly withdrawn as the colon was fully examined.   COLON FINDINGS: Three polyps ranging between 3-30mm in size were found in the ascending colon and transverse colon.  A polypectomy was performed with a cold snare.  The resection was complete and the polyp tissue was completely retrieved.   The colon mucosa was otherwise normal.  Retroflexed views revealed no abnormalities. The time to cecum=5 minutes 39 seconds.  Withdrawal time=18 minutes 05 seconds.  The scope was withdrawn and the procedure completed. COMPLICATIONS: There were no complications.  ENDOSCOPIC IMPRESSION: 1.   Three polyps  were found in the ascending colon and transverse colon; polypectomy  was performed with a cold snare 2.   The colon mucosa was otherwise normal  RECOMMENDATIONS: 1. Follow up colonoscopy in 3 or 5 years (pending pathology results)   eSigned:  Eustace Quail, MD 03/18/2014 9:22 AM   cc: Rosalita Chessman, DO and The Patient

## 2014-03-18 NOTE — Patient Instructions (Signed)
YOU HAD AN ENDOSCOPIC PROCEDURE TODAY AT THE [facility]: Refer to the procedure report that was given to you for any specific questions about what was found during the examination.  If the procedure report does not answer your questions, please call your gastroenterologist to clarify.  If you requested that your care partner not be given the details of your procedure findings, then the procedure report has been included in a sealed envelope for you to review at your convenience later.  YOU SHOULD EXPECT: Some feelings of bloating in the abdomen. Passage of more gas than usual.  Walking can help get rid of the air that was put into your GI tract during the procedure and reduce the bloating. If you had a lower endoscopy (such as a colonoscopy or flexible sigmoidoscopy) you may notice spotting of blood in your stool or on the toilet paper. If you underwent a bowel prep for your procedure, then you may not have a normal bowel movement for a few days.  DIET: Your first meal following the procedure should be a light meal and then it is ok to progress to your normal diet.  A half-sandwich or bowl of soup is an example of a good first meal.  Heavy or fried foods are harder to digest and may make you feel nauseous or bloated.  Likewise meals heavy in dairy and vegetables can cause extra gas to form and this can also increase the bloating.  Drink plenty of fluids but you should avoid alcoholic beverages for 24 hours.  ACTIVITY: Your care partner should take you home directly after the procedure.  You should plan to take it easy, moving slowly for the rest of the day.  You can resume normal activity the day after the procedure however you should NOT DRIVE or use heavy machinery for 24 hours (because of the sedation medicines used during the test).    SYMPTOMS TO REPORT IMMEDIATELY: A gastroenterologist can be reached at any hour.  During normal business hours, 8:30 AM to 5:00 PM Monday through Friday,  call [phone].  After hours and on weekends, please call the GI answering service at [phone] who will take a message and have the physician on call contact you.   Following lower endoscopy (colonoscopy or flexible sigmoidoscopy):  Excessive amounts of blood in the stool  Significant tenderness or worsening of abdominal pains  Swelling of the abdomen that is new, acute  Fever of 100F or higher  FOLLOW UP: If any biopsies were taken you will be contacted by phone or by letter within the next 1-3 weeks.  Call your gastroenterologist if you have not heard about the biopsies in 3 weeks.  Our staff will call the home number listed on your records the next business day following your procedure to check on you and address any questions or concerns that you may have at that time regarding the information given to you following your procedure. This is a courtesy call and so if there is no answer at the home number and we have not heard from you through the emergency physician on call, we will assume that you have returned to your regular daily activities without incident.  SIGNATURES/CONFIDENTIALITY: You and/or your care partner have signed paperwork which will be entered into your electronic medical record.  These signatures attest to the fact that that the information above on your After Visit Summary has been reviewed and is understood.  Full responsibility of the confidentiality of this   discharge information lies with you and/or your care-partner.  Read your handout about polyps.

## 2014-03-18 NOTE — Progress Notes (Signed)
Called to room to assist during endoscopic procedure.  Patient ID and intended procedure confirmed with present staff. Received instructions for my participation in the procedure from the performing physician.  

## 2014-03-19 ENCOUNTER — Telehealth: Payer: Self-pay | Admitting: *Deleted

## 2014-03-19 NOTE — Telephone Encounter (Signed)
  Follow up Call-  Call back number 03/18/2014  Post procedure Call Back phone  # [phone]  Permission to leave phone message Yes     Patient questions:  Do you have a fever, pain , or abdominal swelling? no Pain Score  0 *  Have you tolerated food without any problems? yes  Have you been able to return to your normal activities? yes  Do you have any questions about your discharge instructions: Diet   no Medications  no Follow up visit  no  Do you have questions or concerns about your Care? no  Actions: * If pain score is 4 or above: No action needed, pain <4.

## 2014-03-23 ENCOUNTER — Encounter: Payer: Self-pay | Admitting: Internal Medicine

## 2014-05-28 ENCOUNTER — Telehealth: Payer: Self-pay | Admitting: *Deleted

## 2014-05-28 NOTE — Telephone Encounter (Signed)
Spoke with patient and he stated he is been having excessive sweating and ED and he wanted to know if we changed his medication.  I advised his med's have been the same since March, he asked would the pharmacy change it,  I advised him that he would need to call them to see if they have change manufactures and did not say anything about it. He voiced understanding, I asked did he want something for the ED and he said no. He wants to see if anything has been changed and he will let us know.     KP

## 2014-05-28 NOTE — Telephone Encounter (Signed)
Caller name: Jermany Relation to pt: Call back number:[phone] Pharmacy:  Reason for call:  Pt has questions about his medications.  Wants to know if it was changed as well.

## 2014-08-24 ENCOUNTER — Ambulatory Visit: Payer: BC Managed Care – PPO | Admitting: Family Medicine

## 2014-08-24 ENCOUNTER — Encounter: Payer: Self-pay | Admitting: Family Medicine

## 2014-08-24 ENCOUNTER — Ambulatory Visit (INDEPENDENT_AMBULATORY_CARE_PROVIDER_SITE_OTHER): Payer: BC Managed Care – PPO | Admitting: Family Medicine

## 2014-08-24 VITALS — BP 136/74 | HR 77 | Temp 98.7°F | Wt 214.0 lb

## 2014-08-24 DIAGNOSIS — J0110 Acute frontal sinusitis, unspecified: Secondary | ICD-10-CM

## 2014-08-24 DIAGNOSIS — I10 Essential (primary) hypertension: Secondary | ICD-10-CM

## 2014-08-24 MED ORDER — CEFUROXIME AXETIL 500 MG PO TABS: 500.0000 mg | ORAL_TABLET | Freq: Two times a day (BID) | ORAL | Status: AC

## 2014-08-24 MED ORDER — LORATADINE 10 MG PO TABS: 10.0000 mg | ORAL_TABLET | Freq: Every day | ORAL | Status: DC

## 2014-08-24 MED ORDER — FLUTICASONE PROPIONATE 50 MCG/ACT NA SUSP: 2.0000 | Freq: Every day | NASAL | Status: DC

## 2014-08-24 NOTE — Progress Notes (Signed)
Pre visit review using our clinic review tool, if applicable. No additional management support is needed unless otherwise documented below in the visit note. 

## 2014-08-24 NOTE — Patient Instructions (Signed)

## 2014-08-24 NOTE — Progress Notes (Signed)
  Subjective:     Bobby Castaneda is a 48 y.o. male who presents for evaluation of sinus pain. Symptoms include: congestion, cough, facial pain, headaches, nasal congestion, sinus pressure and sore throat. Onset of symptoms was 4 days ago. Symptoms have been gradually worsening since that time. Past history is significant for no history of pneumonia or bronchitis. Patient is a non-smoker.  Pt has taken coricidin hbp.  Pt is also here for bp check.   The following portions of the patient's history were reviewed and updated as appropriate: allergies, current medications, past family history, past medical history, past social history, past surgical history and problem list.  Review of Systems Pertinent items are noted in HPI.   Objective:    BP 136/74 mmHg  Pulse 77  Temp(Src) 98.7 F (37.1 C) (Oral)  Wt 214 lb (97.07 kg)  SpO2 97% General appearance: alert, cooperative, appears stated age and no distress Ears: normal TM's and external ear canals both ears Nose: green discharge, mild congestion, turbinates red, swollen, sinus tenderness bilateral Throat: lips, mucosa, and tongue normal; teeth and gums normal Neck: moderate anterior cervical adenopathy, no JVD, supple, symmetrical, trachea midline and thyroid not enlarged, symmetric, no tenderness/mass/nodules Lungs: clear to auscultation bilaterally Heart: S1, S2 normal Extremities: extremities normal, atraumatic, no cyanosis or edema    Assessment:    Acute bacterial sinusitis.    Plan:    Nasal steroids per medication orders. Antihistamines per medication orders. Ceftin per medication orders.    1. Acute frontal sinusitis, recurrence not specified - cefUROXime (CEFTIN) 500 MG tablet; Take 1 tablet (500 mg total) by mouth 2 (two) times daily.  Dispense: 20 tablet; Refill: 0 - loratadine (CLARITIN) 10 MG tablet; Take 1 tablet (10 mg total) by mouth daily.  Dispense: 30 tablet; Refill: 11 - fluticasone (FLONASE) 50 MCG/ACT nasal  spray; Place 2 sprays into both nostrils daily.  Dispense: 16 g; Refill: 6  2. Essential hypertension Check labs, stable, con't meds - Basic metabolic panel; Future - Hepatic function panel; Future - Lipid panel; Future

## 2014-08-25 ENCOUNTER — Other Ambulatory Visit (INDEPENDENT_AMBULATORY_CARE_PROVIDER_SITE_OTHER): Payer: BC Managed Care – PPO

## 2014-08-25 DIAGNOSIS — I10 Essential (primary) hypertension: Secondary | ICD-10-CM

## 2014-08-25 LAB — BASIC METABOLIC PANEL
BUN: 13 mg/dL (ref 6–23)
CALCIUM: 9.1 mg/dL (ref 8.4–10.5)
CO2: 31 meq/L (ref 19–32)
Chloride: 103 mEq/L (ref 96–112)
Creatinine, Ser: 1.2 mg/dL (ref 0.4–1.5)
GFR: 80.19 mL/min (ref >60.00)
Glucose, Bld: 77 mg/dL (ref 70–99)
Potassium: 3.6 mEq/L (ref 3.5–5.1)
SODIUM: 141 meq/L (ref 135–145)

## 2014-08-25 LAB — LIPID PANEL
CHOL/HDL RATIO: 3
Cholesterol: 124 mg/dL (ref 0–200)
HDL: 45.8 mg/dL (ref >39.00)
LDL CALC: 65 mg/dL (ref 0–99)
NonHDL: 78.2
TRIGLYCERIDES: 64 mg/dL (ref 0.0–149.0)
VLDL: 12.8 mg/dL (ref 0.0–40.0)

## 2014-08-25 LAB — HEPATIC FUNCTION PANEL
ALK PHOS: 75 U/L (ref 39–117)
ALT: 19 U/L (ref 0–53)
AST: 24 U/L (ref 0–37)
Albumin: 4.3 g/dL (ref 3.5–5.2)
BILIRUBIN DIRECT: 0.1 mg/dL (ref 0.0–0.3)
BILIRUBIN TOTAL: 0.9 mg/dL (ref 0.2–1.2)
Total Protein: 7.8 g/dL (ref 6.0–8.3)

## 2014-09-04 ENCOUNTER — Emergency Department (HOSPITAL_COMMUNITY)
Admission: EM | Admit: 2014-09-04 | Discharge: 2014-09-04 | Disposition: A | Discharge status: Home / Self Care | Payer: BC Managed Care – PPO | Attending: Emergency Medicine | Admitting: Emergency Medicine

## 2014-09-04 ENCOUNTER — Encounter (HOSPITAL_COMMUNITY): Payer: Self-pay | Admitting: *Deleted

## 2014-09-04 ENCOUNTER — Emergency Department (HOSPITAL_COMMUNITY)
Admission: EM | Admit: 2014-09-04 | Discharge: 2014-09-04 | Disposition: A | Discharge status: Home / Self Care | Payer: BC Managed Care – PPO | Source: Home / Self Care | Attending: Family Medicine | Admitting: Family Medicine

## 2014-09-04 ENCOUNTER — Emergency Department (HOSPITAL_COMMUNITY): Payer: BC Managed Care – PPO

## 2014-09-04 DIAGNOSIS — Z79899 Other long term (current) drug therapy: Secondary | ICD-10-CM | POA: Diagnosis not present

## 2014-09-04 DIAGNOSIS — R1031 Right lower quadrant pain: Secondary | ICD-10-CM | POA: Diagnosis present

## 2014-09-04 DIAGNOSIS — R1033 Periumbilical pain: Secondary | ICD-10-CM | POA: Insufficient documentation

## 2014-09-04 DIAGNOSIS — Z7951 Long term (current) use of inhaled steroids: Secondary | ICD-10-CM | POA: Insufficient documentation

## 2014-09-04 DIAGNOSIS — R1013 Epigastric pain: Secondary | ICD-10-CM | POA: Diagnosis not present

## 2014-09-04 DIAGNOSIS — R109 Unspecified abdominal pain: Secondary | ICD-10-CM

## 2014-09-04 DIAGNOSIS — E785 Hyperlipidemia, unspecified: Secondary | ICD-10-CM | POA: Diagnosis not present

## 2014-09-04 LAB — COMPREHENSIVE METABOLIC PANEL
ALT: 17 U/L (ref 0–53)
AST: 22 U/L (ref 0–37)
Albumin: 3.8 g/dL (ref 3.5–5.2)
Alkaline Phosphatase: 85 U/L (ref 39–117)
Anion gap: 13 (ref 5–15)
BUN: 13 mg/dL (ref 6–23)
CALCIUM: 8.7 mg/dL (ref 8.4–10.5)
CO2: 25 meq/L (ref 19–32)
Chloride: 100 mEq/L (ref 96–112)
Creatinine, Ser: 1.16 mg/dL (ref 0.50–1.35)
GFR, EST AFRICAN AMERICAN: 84 mL/min — AB (ref >90)
GFR, EST NON AFRICAN AMERICAN: 72 mL/min — AB (ref >90)
GLUCOSE: 96 mg/dL (ref 70–99)
Potassium: 3.4 mEq/L — ABNORMAL LOW (ref 3.7–5.3)
Sodium: 138 mEq/L (ref 137–147)
Total Bilirubin: 0.5 mg/dL (ref 0.3–1.2)
Total Protein: 7.6 g/dL (ref 6.0–8.3)

## 2014-09-04 LAB — POCT URINALYSIS DIP (DEVICE)
Bilirubin Urine: NEGATIVE
Glucose, UA: NEGATIVE mg/dL
Hgb urine dipstick: NEGATIVE
Ketones, ur: NEGATIVE mg/dL
Leukocytes, UA: NEGATIVE
Nitrite: NEGATIVE
PH: 7.5 (ref 5.0–8.0)
PROTEIN: NEGATIVE mg/dL
Specific Gravity, Urine: 1.02 (ref 1.005–1.030)
UROBILINOGEN UA: 1 mg/dL (ref 0.0–1.0)

## 2014-09-04 LAB — LIPASE, BLOOD: Lipase: 17 U/L (ref 11–59)

## 2014-09-04 LAB — CBC WITH DIFFERENTIAL/PLATELET
Basophils Absolute: 0 10*3/uL (ref 0.0–0.1)
Basophils Relative: 0 % (ref 0–1)
EOS PCT: 0 % (ref 0–5)
Eosinophils Absolute: 0 10*3/uL (ref 0.0–0.7)
HCT: 39.1 % (ref 39.0–52.0)
HEMOGLOBIN: 13.4 g/dL (ref 13.0–17.0)
LYMPHS ABS: 0.8 10*3/uL (ref 0.7–4.0)
Lymphocytes Relative: 23 % (ref 12–46)
MCH: 30 pg (ref 26.0–34.0)
MCHC: 34.3 g/dL (ref 30.0–36.0)
MCV: 87.7 fL (ref 78.0–100.0)
Monocytes Absolute: 0.2 10*3/uL (ref 0.1–1.0)
Monocytes Relative: 7 % (ref 3–12)
Neutro Abs: 2.4 10*3/uL (ref 1.7–7.7)
Neutrophils Relative %: 70 % (ref 43–77)
Platelets: 238 10*3/uL (ref 150–400)
RBC: 4.46 MIL/uL (ref 4.22–5.81)
RDW: 12.7 % (ref 11.5–15.5)
WBC: 3.4 10*3/uL — AB (ref 4.0–10.5)

## 2014-09-04 MED ORDER — IOHEXOL 300 MG/ML  SOLN
25.0000 mL | INTRAMUSCULAR | Status: DC | PRN
  Administered 2014-09-04: 25 mL via ORAL
  Filled 2014-09-04: qty 30

## 2014-09-04 MED ORDER — IOHEXOL 300 MG/ML  SOLN
100.0000 mL | Freq: Once | INTRAMUSCULAR | Status: AC | PRN
  Administered 2014-09-04: 100 mL via INTRAVENOUS

## 2014-09-04 MED ORDER — OXYCODONE-ACETAMINOPHEN 5-325 MG PO TABS: 1.0000 | ORAL_TABLET | ORAL | Status: DC | PRN

## 2014-09-04 MED ORDER — ONDANSETRON 4 MG PO TBDP
ORAL_TABLET | ORAL | Status: AC
  Filled 2014-09-04: qty 2

## 2014-09-04 MED ORDER — MORPHINE SULFATE 4 MG/ML IJ SOLN
4.0000 mg | Freq: Once | INTRAMUSCULAR | Status: AC
  Administered 2014-09-04: 4 mg via INTRAVENOUS
  Filled 2014-09-04: qty 1

## 2014-09-04 MED ORDER — ONDANSETRON HCL 4 MG PO TABS: 4.0000 mg | ORAL_TABLET | Freq: Four times a day (QID) | ORAL | Status: DC

## 2014-09-04 MED ORDER — HYDROMORPHONE HCL 1 MG/ML IJ SOLN
2.0000 mg | Freq: Once | INTRAMUSCULAR | Status: AC
  Administered 2014-09-04: 2 mg via INTRAMUSCULAR

## 2014-09-04 MED ORDER — HYDROMORPHONE HCL 1 MG/ML IJ SOLN
INTRAMUSCULAR | Status: AC
  Filled 2014-09-04: qty 2

## 2014-09-04 MED ORDER — ONDANSETRON 4 MG PO TBDP
8.0000 mg | ORAL_TABLET | Freq: Once | ORAL | Status: AC
  Administered 2014-09-04: 8 mg via ORAL

## 2014-09-04 MED ORDER — HYDROMORPHONE HCL 1 MG/ML IJ SOLN
1.0000 mg | Freq: Once | INTRAMUSCULAR | Status: AC
  Administered 2014-09-04: 1 mg via INTRAVENOUS
  Filled 2014-09-04: qty 1

## 2014-09-04 MED ORDER — ONDANSETRON HCL 4 MG/2ML IJ SOLN
4.0000 mg | Freq: Once | INTRAMUSCULAR | Status: AC
  Administered 2014-09-04: 4 mg via INTRAVENOUS
  Filled 2014-09-04: qty 2

## 2014-09-04 NOTE — ED Provider Notes (Signed)
CSN: [id number]     Arrival date & time 09/04/14  0240 History   First MD Initiated Contact with Patient 09/04/14 1009     Chief Complaint  Patient presents with  . Abdominal Pain   (Consider location/radiation/quality/duration/timing/severity/associated sxs/prior Treatment) Patient is a 48 y.o. male presenting with abdominal pain. The history is provided by the patient.  Abdominal Pain Pain location:  Epigastric, periumbilical and RLQ Pain quality: sharp   Pain radiates to:  Does not radiate (no back pain) Pain severity:  Moderate Onset quality:  Gradual Duration:  1 day Progression:  Worsening Chronicity:  New Context: not alcohol use, not previous surgeries and not suspicious food intake   Associated symptoms: nausea and vomiting   Associated symptoms: no constipation, no diarrhea, no dysuria, no fever and no hematuria   Associated symptoms comment:  Worse standing up and riding in car.   Past Medical History  Diagnosis Date  . Hypertension   . Hyperlipidemia    Past Surgical History  Procedure Laterality Date  . Knee surgery      bilat  . Adenoidectomy  08/2011    Gore--- gso ent  . Closed reduction hand fracture  12/2010    Right hand   Family History  Problem Relation Age of Onset  . Hypertension    . Hypertension Mother   . Cancer Mother 49    breast  . Hypertension Father   . Hyperlipidemia Father   . Stroke Father   . Cancer Father     prostate  . Colon cancer Father   . Hypertension Sister   . Cancer Maternal Aunt 32    breast  . Heart disease Maternal Uncle     mi  . Hypertension Maternal Uncle   . Hypertension Maternal Grandmother   . Alzheimer's disease Maternal Grandmother   . Heart disease Maternal Grandfather     mi  . Hypertension Maternal Grandfather   . Hyperlipidemia Maternal Grandfather   . Stroke Maternal Grandfather   . Stroke Maternal Uncle   . Hypertension Maternal Uncle    History  Substance Use Topics  . Smoking status:  Never Smoker   . Smokeless tobacco: Never Used  . Alcohol Use: No    Review of Systems  Constitutional: Negative.  Negative for fever.  Gastrointestinal: Positive for nausea, vomiting and abdominal pain. Negative for diarrhea and constipation.  Genitourinary: Negative for dysuria and hematuria.  Musculoskeletal: Negative for back pain.    Allergies  Review of patient's allergies indicates no known allergies.  Home Medications   Prior to Admission medications   Medication Sig Start Date End Date Taking? Authorizing Provider  amLODipine (NORVASC) 5 MG tablet Take 1 tablet (5 mg total) by mouth daily. 02/23/14   Rosalita Chessman, DO  atorvastatin (LIPITOR) 20 MG tablet TAKE 1 TAB BY MOUTH AT BEDTIME- 02/23/14   Alferd Apa Lowne, DO  benazepril (LOTENSIN) 40 MG tablet Take 1 tablet (40 mg total) by mouth daily. 02/23/14   Alferd Apa Lowne, DO  fluticasone (FLONASE) 50 MCG/ACT nasal spray Place 2 sprays into both nostrils daily. 08/24/14   Rosalita Chessman, DO  loratadine (CLARITIN) 10 MG tablet Take 1 tablet (10 mg total) by mouth daily. 08/24/14   Yvonne R Lowne, DO   BP 158/92 mmHg  Pulse 63  Temp(Src) 98.3 F (36.8 C) (Oral)  Resp 18  SpO2 100% Physical Exam  Constitutional: He appears well-developed and well-nourished. He appears distressed.  Abdominal: Soft. Normal appearance. He  exhibits no mass. Bowel sounds are decreased. There is no hepatosplenomegaly. There is tenderness in the right lower quadrant and epigastric area. There is tenderness at McBurney's point. There is no rigidity, no rebound, no guarding and no CVA tenderness.  Skin: Skin is warm and dry.  Nursing note and vitals reviewed.   ED Course  Procedures (including critical care time) Labs Review Labs Reviewed  POCT URINALYSIS DIP (DEVICE)    Imaging Review No results found.   MDM   1. Abdominal pain, acute, right lower quadrant    Sent for ct eval of rlq abd pain, u/a neg, r/o appy.    Billy Fischer,  MD 09/04/14 [id number]

## 2014-09-04 NOTE — ED Provider Notes (Signed)
CSN: [id number]     Arrival date & time 09/04/14  1056 History   First MD Initiated Contact with Patient 09/04/14 1105     Chief Complaint  Patient presents with  . Abdominal Pain     (Consider location/radiation/quality/duration/timing/severity/associated sxs/prior Treatment) Patient is a 48 y.o. male presenting with abdominal pain. The history is provided by the patient and medical records.  Abdominal Pain  This is a 48 year old male with past medical history significant for hypertension and hyperlipidemia, presenting to the ED for abdominal pain. Patient states pain began approximately 24 hours ago and has been progressively worsening. He states initially it was localized to only his periumbilical and epigastric regions, but this morning upon waking, noticed that it was radiating to his right lower quadrant. He denies any associated nausea, vomiting, or diarrhea. He has not had a bowel movement today. Denies any fever or chills.  No prior abdominal surgeries.  Patient states he was given dose of pain medication and Zofran at urgent care with some improvement of his pain.  Past Medical History  Diagnosis Date  . Hypertension   . Hyperlipidemia    Past Surgical History  Procedure Laterality Date  . Knee surgery      bilat  . Adenoidectomy  08/2011    Gore--- gso ent  . Closed reduction hand fracture  12/2010    Right hand   Family History  Problem Relation Age of Onset  . Hypertension    . Hypertension Mother   . Cancer Mother 30    breast  . Hypertension Father   . Hyperlipidemia Father   . Stroke Father   . Cancer Father     prostate  . Colon cancer Father   . Hypertension Sister   . Cancer Maternal Aunt 34    breast  . Heart disease Maternal Uncle     mi  . Hypertension Maternal Uncle   . Hypertension Maternal Grandmother   . Alzheimer's disease Maternal Grandmother   . Heart disease Maternal Grandfather     mi  . Hypertension Maternal Grandfather   .  Hyperlipidemia Maternal Grandfather   . Stroke Maternal Grandfather   . Stroke Maternal Uncle   . Hypertension Maternal Uncle    History  Substance Use Topics  . Smoking status: Never Smoker   . Smokeless tobacco: Never Used  . Alcohol Use: No    Review of Systems  Gastrointestinal: Positive for abdominal pain.  All other systems reviewed and are negative.     Allergies  Review of patient's allergies indicates no known allergies.  Home Medications   Prior to Admission medications   Medication Sig Start Date End Date Taking? Authorizing Provider  amLODipine (NORVASC) 5 MG tablet Take 1 tablet (5 mg total) by mouth daily. 02/23/14   Rosalita Chessman, DO  atorvastatin (LIPITOR) 20 MG tablet TAKE 1 TAB BY MOUTH AT BEDTIME- 02/23/14   Alferd Apa Lowne, DO  benazepril (LOTENSIN) 40 MG tablet Take 1 tablet (40 mg total) by mouth daily. 02/23/14   Alferd Apa Lowne, DO  fluticasone (FLONASE) 50 MCG/ACT nasal spray Place 2 sprays into both nostrils daily. 08/24/14   Rosalita Chessman, DO  loratadine (CLARITIN) 10 MG tablet Take 1 tablet (10 mg total) by mouth daily. 08/24/14   Yvonne R Lowne, DO   BP 159/90 mmHg  Pulse 56  Temp(Src) 98.7 F (37.1 C) (Oral)  Resp 18  SpO2 100%   Physical Exam  Constitutional: He is oriented to  person, place, and time. He appears well-developed and well-nourished.  HENT:  Head: Normocephalic and atraumatic.  Mouth/Throat: Oropharynx is clear and moist.  Eyes: Conjunctivae and EOM are normal. Pupils are equal, round, and reactive to light.  Neck: Normal range of motion.  Cardiovascular: Normal rate, regular rhythm and normal heart sounds.   Pulmonary/Chest: Effort normal and breath sounds normal. No respiratory distress. He has no wheezes.  Abdominal: Soft. Bowel sounds are normal. There is tenderness in the right lower quadrant and periumbilical area. There is tenderness at McBurney's point. There is no guarding.  Abdomen soft, non-distended, tenderness in  epigastrium, peri-umbilical region, and RLQ; + McBurney's point tenderness, + heel tap  Musculoskeletal: Normal range of motion.  Neurological: He is alert and oriented to person, place, and time.  Skin: Skin is warm and dry.  Psychiatric: He has a normal mood and affect.  Nursing note and vitals reviewed.   ED Course  Procedures (including critical care time) Labs Review Labs Reviewed  CBC WITH DIFFERENTIAL - Abnormal; Notable for the following:    WBC 3.4 (*)    All other components within normal limits  COMPREHENSIVE METABOLIC PANEL - Abnormal; Notable for the following:    Potassium 3.4 (*)    GFR calc non Af Amer 72 (*)    GFR calc Af Amer 84 (*)    All other components within normal limits  LIPASE, BLOOD    Imaging Review Ct Abdomen Pelvis W Contrast  09/04/2014   CLINICAL DATA:  Lower abdominal pain, nausea/vomiting  EXAM: CT ABDOMEN AND PELVIS WITH CONTRAST  TECHNIQUE: Multidetector CT imaging of the abdomen and pelvis was performed using the standard protocol following bolus administration of intravenous contrast.  CONTRAST:  135mL OMNIPAQUE IOHEXOL 300 MG/ML  SOLN  COMPARISON:  None.  FINDINGS: Lower chest:  Lung bases are clear.  Hepatobiliary: Liver is within normal limits.  Gallbladder is unremarkable. No intrahepatic or extrahepatic ductal dilatation.  Pancreas: Within normal limits.  Spleen: Within normal limits.  Adrenals/Urinary Tract: Adrenal glands are unremarkable.  9 mm posterior right upper pole renal cyst. Left kidney is unremarkable. No hydronephrosis.  Bladder is within normal limits.  Stomach/Bowel: Stomach is unremarkable.  No evidence of bowel obstruction.  Normal appendix.  Vascular/Lymphatic: No evidence of abdominal aortic aneurysm.  No suspicious abdominopelvic lymphadenopathy.  Reproductive: Prostatomegaly, measuring 5.6 cm in transverse dimension.  Other: No abdominopelvic ascites.  Musculoskeletal: Visualized osseous structures are within normal limits.   IMPRESSION: No evidence of bowel obstruction.  Normal appendix.  No CT findings to account for the patient's abdominal pain.  Prostatomegaly.   Electronically Signed   By: Julian Hy M.D.   On: 09/04/2014 15:02     EKG Interpretation None      MDM   Final diagnoses:  Abdominal pain  Abdominal pain   48 year old male with abdominal pain. Seen at urgent care and sent to the ED for further evaluation. On exam, patient afebrile neuron nontoxic in appearance.  He does have some noted epigastrium, periumbilical, and right lower quadrant tenderness. He does have McBurney's point tenderness with positive heel tap. Will plan for basic labs and CT for further evaluation.  Patient given morphine and Zofran.  Lab work is reassuring. CT negative for acute abdominal pathology.  Patient's pain has significantly improved while in the emergency department, he is resting comfortably in bed. Discussed lab and CT findings with patient and wife at bedside, they  acknowledged understanding. Patient will follow up with his  primary care physician.  Rx percocet and zofran.  Discussed plan with patient, he/she acknowledged understanding and agreed with plan of care.  Return precautions given for new or worsening symptoms.  Larene Pickett, PA-C 09/04/14 [place], MD 09/05/14 [phone]

## 2014-09-04 NOTE — ED Notes (Signed)
Pt finished drinking his contrast; Ct called

## 2014-09-04 NOTE — Discharge Instructions (Signed)
Take the prescribed medication as directed. Follow-up with your primary care physician. Return to the ED for new or worsening symptoms-- uncontrollable pain, N/V unable to hold down meds, high fever, etc.

## 2014-09-04 NOTE — ED Notes (Signed)
Remains  Npo    

## 2014-09-04 NOTE — ED Notes (Signed)
Pt made aware to return if symptoms worsen or if any life threatening symptoms occur.   

## 2014-09-04 NOTE — ED Notes (Signed)
Pt sent here from [facility], having lower abd pain, sent here to r/o appendicitis.

## 2014-09-04 NOTE — ED Notes (Signed)
Pt      Reports    Symptoms  Of      abd  Pain        With  Vomiting  X  1    Day         Pain is  Worse   When he  Stands  Up

## 2015-02-08 ENCOUNTER — Telehealth: Payer: Self-pay | Admitting: *Deleted
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
## 2015-02-10 ENCOUNTER — Ambulatory Visit (INDEPENDENT_AMBULATORY_CARE_PROVIDER_SITE_OTHER): Payer: BLUE CROSS/BLUE SHIELD | Admitting: Medical

## 2015-02-10 ENCOUNTER — Encounter: Payer: Self-pay | Admitting: Medical

## 2015-02-10 ENCOUNTER — Ambulatory Visit (HOSPITAL_BASED_OUTPATIENT_CLINIC_OR_DEPARTMENT_OTHER)
Admission: RE | Admit: 2015-02-10 | Discharge: 2015-02-10 | Disposition: A | Discharge status: Home / Self Care | Payer: BLUE CROSS/BLUE SHIELD | Source: Ambulatory Visit | Attending: Medical | Admitting: Medical

## 2015-02-10 VITALS — BP 165/106 | HR 56 | Temp 98.6°F | Ht 71.0 in | Wt 217.0 lb

## 2015-02-10 DIAGNOSIS — M79605 Pain in left leg: Secondary | ICD-10-CM | POA: Diagnosis not present

## 2015-02-10 DIAGNOSIS — M79652 Pain in left thigh: Secondary | ICD-10-CM | POA: Diagnosis not present

## 2015-02-10 MED ORDER — TRAMADOL HCL 50 MG PO TABS: 50.0000 mg | ORAL_TABLET | Freq: Four times a day (QID) | ORAL | Status: DC | PRN

## 2015-02-10 MED ORDER — DICLOFENAC SODIUM 75 MG PO TBEC: 75.0000 mg | DELAYED_RELEASE_TABLET | Freq: Two times a day (BID) | ORAL | Status: DC

## 2015-02-10 NOTE — Patient Instructions (Addendum)
  Thigh pain. Etiology?? May be muscle strain. But unclear mechanism of injury. No bruise seen. Xray of left femur today. Rx diclofenac. If needed use tramadol. Warm compress to thigh twice daily. If xray negatitve and pain persists then refer to sports medicine early next week.

## 2015-02-10 NOTE — Progress Notes (Signed)
Pre visit review using our clinic review tool, if applicable. No additional management support is needed unless otherwise documented below in the visit note. 

## 2015-02-10 NOTE — Assessment & Plan Note (Signed)
Xray of left femur today. Rx diclofenac. If needed use tramadol. Warm compress to thigh twice daily. If xray negatitve and pain persists then refer to sports medicine early next week.

## 2015-02-10 NOTE — Progress Notes (Signed)
Subjective:    Patient ID: Bobby Castaneda, male    DOB: [birth year], 49 y.o.   MRN: [id number]  HPI   Pt in states in with left thigh pain . He thinks he may have gotten hit by someones  knee  in the area when he was playing basketball. But he is not sure and has no distinct memory of that happening. He has moderate pain on flexion. Mild pain on extension. Pain now for 2 weeks. Pain when he bending is not easing up.     Review of Systems  Constitutional: Negative for fever, chills and fatigue.  Respiratory: Negative for cough, chest tightness and shortness of breath.   Cardiovascular: Negative for chest pain and palpitations.  Musculoskeletal:       Left thigh pain.  Neurological: Negative for dizziness and numbness.  Hematological: Negative for adenopathy. Does not bruise/bleed easily.   Past Medical History  Diagnosis Date  . Hypertension   . Hyperlipidemia     History   Social History  . Marital Status: Married    Spouse Name: N/A  . Number of Children: Y  . Years of Education: N/A   Occupational History  . [employer]   .     Social History Main Topics  . Smoking status: Never Smoker   . Smokeless tobacco: Never Used  . Alcohol Use: No  . Drug Use: No  . Sexual Activity:    Partners: Male   Other Topics Concern  . Not on file   Social History Narrative   Regular Exercise- qd     Past Surgical History  Procedure Laterality Date  . Knee surgery      bilat  . Adenoidectomy  08/2011    [place]--- gso ent  . Closed reduction hand fracture  12/2010    Right hand    Family History  Problem Relation Age of Onset  . Hypertension    . Hypertension Mother   . Cancer Mother 1    breast  . Hypertension Father   . Hyperlipidemia Father   . Stroke Father   . Cancer Father     prostate  . Colon cancer Father   . Hypertension Sister   . Cancer Maternal Aunt 62    breast  . Heart disease Maternal Uncle     mi  . Hypertension Maternal Uncle   .  Hypertension Maternal Grandmother   . Alzheimer's disease Maternal Grandmother   . Heart disease Maternal Grandfather     mi  . Hypertension Maternal Grandfather   . Hyperlipidemia Maternal Grandfather   . Stroke Maternal Grandfather   . Stroke Maternal Uncle   . Hypertension Maternal Uncle     No Known Allergies  Current Outpatient Prescriptions on File Prior to Visit  Medication Sig Dispense Refill  . amLODipine (NORVASC) 5 MG tablet Take 1 tablet (5 mg total) by mouth daily. 90 tablet 3  . atorvastatin (LIPITOR) 20 MG tablet TAKE 1 TAB BY MOUTH AT BEDTIME- 90 tablet 3  . benazepril (LOTENSIN) 40 MG tablet Take 1 tablet (40 mg total) by mouth daily. 90 tablet 3   No current facility-administered medications on file prior to visit.    BP 165/106 mmHg  Pulse 56  Temp(Src) 98.6 F (37 C) (Oral)  Ht 5\' 11"  (1.803 m)  Wt 217 lb (98.431 kg)  BMI 30.28 kg/m2  SpO2 98%       Objective:   Physical Exam  General- No acute distress. Pleasant  patient. Neck- Full range of motion, no jvd Lungs- Clear, even and unlabored. Heart- regular rate and rhythm. Neurologic- CNII- XII grossly intact. Lt hip- no pain on rom or palpation. Lt thigh- lateral aspect. No direct pain on palpation. But on flexing leg pain moderate. No bruise, no redness or warmth.  Lt knee- no pain on palpation or rom.       Assessment & Plan:  Note regarding pt bp level today. When I call him on the xray of leg intend to advise him to check his bp daily. Possible increase amlodipine to 10 mg q day. But follow up with our office in one week for bp. Pt had no neuro or cardiac signs or symptoms.

## 2015-02-11 ENCOUNTER — Encounter: Payer: Self-pay | Admitting: Medical

## 2015-02-11 ENCOUNTER — Telehealth: Payer: Self-pay | Admitting: Medical

## 2015-02-11 DIAGNOSIS — M79652 Pain in left thigh: Secondary | ICD-10-CM

## 2015-02-11 NOTE — Telephone Encounter (Signed)
Refer to sports med 

## 2015-02-15 ENCOUNTER — Ambulatory Visit (INDEPENDENT_AMBULATORY_CARE_PROVIDER_SITE_OTHER): Payer: BLUE CROSS/BLUE SHIELD | Admitting: Family Medicine

## 2015-02-15 ENCOUNTER — Ambulatory Visit: Payer: BLUE CROSS/BLUE SHIELD | Admitting: Family Medicine

## 2015-02-15 ENCOUNTER — Encounter: Payer: Self-pay | Admitting: Family Medicine

## 2015-02-15 VITALS — BP 145/82 | HR 60 | Ht 73.0 in | Wt 213.0 lb

## 2015-02-15 DIAGNOSIS — M79652 Pain in left thigh: Secondary | ICD-10-CM

## 2015-02-15 NOTE — Patient Instructions (Addendum)
You have a partial tear of your quad (specifically the vastus lateralis) with a hematoma. Wear compression sleeve as much as possible, definitely when up and walking around.  ACE wrap if you can't tolerate the sleeve. Ice or heat at this point, whichever feels better, 15 minutes at a time 3-4 times a day Gentle stretching for motion right now Diclofenac 75mg  twice a day with food. When pain is less than a 3 on a scale of 1-10 you can start the straight leg raises, knee extensions, side leg raises. Follow up with me in 2 weeks.

## 2015-02-17 ENCOUNTER — Encounter: Payer: Self-pay | Admitting: Medical

## 2015-02-17 ENCOUNTER — Ambulatory Visit (INDEPENDENT_AMBULATORY_CARE_PROVIDER_SITE_OTHER): Payer: BLUE CROSS/BLUE SHIELD | Admitting: Medical

## 2015-02-17 VITALS — BP 162/88 | HR 61 | Temp 99.0°F | Ht 73.0 in | Wt 217.6 lb

## 2015-02-17 DIAGNOSIS — M79652 Pain in left thigh: Secondary | ICD-10-CM | POA: Diagnosis not present

## 2015-02-17 DIAGNOSIS — I10 Essential (primary) hypertension: Secondary | ICD-10-CM | POA: Diagnosis not present

## 2015-02-17 MED ORDER — DICLOFENAC SODIUM 75 MG PO TBEC: 75.0000 mg | DELAYED_RELEASE_TABLET | Freq: Two times a day (BID) | ORAL | Status: DC

## 2015-02-17 MED ORDER — AMLODIPINE BESYLATE 10 MG PO TABS: 10.0000 mg | ORAL_TABLET | Freq: Every day | ORAL | Status: DC

## 2015-02-17 NOTE — Progress Notes (Signed)
Pre visit review using our clinic review tool, if applicable. No additional management support is needed unless otherwise documented below in the visit note. 

## 2015-02-17 NOTE — Patient Instructions (Addendum)
Left thigh pain Lateral thigh muscle tear. Treat by sport medicine. Doing well. Rx diclofenac.   HYPERTENSION I want to you to increase amlodipine to 10 mg q day. Cotinue lotensin 40 mg q day.     Follow up in 3 wks bp check and recheck thigh.

## 2015-02-17 NOTE — Assessment & Plan Note (Addendum)
I want to you to increase amlodipine to 10 mg q day. Cotinue lotensin 40 mg q day. Repeat cmp and put future lab in.

## 2015-02-17 NOTE — Assessment & Plan Note (Addendum)
Lateral thigh muscle tear. Treat by sport medicine. Doing well. Rx diclofenac.

## 2015-02-17 NOTE — Progress Notes (Signed)
PCP: Garnet Koyanagi, DO Referred by: Mackie Pai PA  Subjective:   HPI: Patient is a 49 y.o. male here for left quad pain.  Patient reports about 2 weeks ago he started to get lateral left thigh pain. Recalls this starting after playing basketball but no injury he can recall when he was playing. Pain now 2/10 at rest, up to 9/10 with flexion of knee. Tried ibuprofen. Radiographs of femur negative.  Past Medical History  Diagnosis Date  . Hypertension   . Hyperlipidemia     Current Outpatient Prescriptions on File Prior to Visit  Medication Sig Dispense Refill  . amLODipine (NORVASC) 5 MG tablet Take 1 tablet (5 mg total) by mouth daily. 90 tablet 3  . atorvastatin (LIPITOR) 20 MG tablet TAKE 1 TAB BY MOUTH AT BEDTIME- 90 tablet 3  . diclofenac (VOLTAREN) 75 MG EC tablet Take 1 tablet (75 mg total) by mouth 2 (two) times daily. 30 tablet 0  . traMADol (ULTRAM) 50 MG tablet Take 1 tablet (50 mg total) by mouth every 6 (six) hours as needed. 12 tablet 0   No current facility-administered medications on file prior to visit.    Past Surgical History  Procedure Laterality Date  . Knee surgery      bilat  . Adenoidectomy  08/2011    [place]ore--- gso ent  . Closed reduction hand fracture  12/2010    Right hand    No Known Allergies  History   Social History  . Marital Status: Married    Spouse Name: N/A  . Number of Children: Y  . Years of Education: N/A   Occupational History  . [employer]   .     Social History Main Topics  . Smoking status: Never Smoker   . Smokeless tobacco: Never Used  . Alcohol Use: No  . Drug Use: No  . Sexual Activity:    Partners: Male   Other Topics Concern  . Not on file   Social History Narrative   Regular Exercise- qd     Family History  Problem Relation Age of Onset  . Hypertension    . Hypertension Mother   . Cancer Mother 67    breast  . Hypertension Father   . Hyperlipidemia Father   . Stroke Father   . Cancer  Father     prostate  . Colon cancer Father   . Hypertension Sister   . Cancer Maternal Aunt 110    breast  . Heart disease Maternal Uncle     mi  . Hypertension Maternal Uncle   . Hypertension Maternal Grandmother   . Alzheimer's disease Maternal Grandmother   . Heart disease Maternal Grandfather     mi  . Hypertension Maternal Grandfather   . Hyperlipidemia Maternal Grandfather   . Stroke Maternal Grandfather   . Stroke Maternal Uncle   . Hypertension Maternal Uncle     BP 145/82 mmHg  Pulse 60  Ht 6\' 1"  (1.854 m)  Wt 213 lb (96.616 kg)  BMI 28.11 kg/m2  Review of Systems: See HPI above.    Objective:  Physical Exam:  Gen: NAD  Left leg: No gross deformity, swelling, bruising. TTP mid lateral left quadriceps area.  No palpable defect. FROM knee but pain on flexion beyond 90 degrees. FROM hip without pain. Mild pain on resisted knee extension. NVI distally. Able to bear weight comfortably.  MSK u/s:  Muscle defect noted within vastus lateralis with accompanying hematoma.  No deformity noted  in right quad in same area.    Assessment & Plan:  1. Left vastus lateralis tear - likely related to basketball though he does not recall an injury.  Start with compression sleeve, ice or heat, gentle stretching, diclofenac twice a day.  Reviewed strengthening exercises he can do when pain is less than a 3 on a scale of 1-10.  F/u in 2 weeks for reevaluation.

## 2015-02-17 NOTE — Progress Notes (Signed)
Subjective:    Patient ID: Bobby Castaneda, male    DOB: 03/02/1966, 49 y.o.   MRN: [id number]  HPI   Pt did see sports medicine. He had vatus lateralis tear. Undertreatment for gradually getting better. Pt pain level 2/10 now. Was 9/10 at time of injury.   Pt bp has been on moderate high side for a while on review. No cardiac or neurologic signs or symptoms. Has been exercising. Moderate healthy diet.     Review of Systems  Constitutional: Negative for fever, chills, diaphoresis, activity change and fatigue.  Respiratory: Negative for cough, chest tightness and shortness of breath.   Cardiovascular: Negative for chest pain, palpitations and leg swelling.  Gastrointestinal: Negative for nausea, vomiting and abdominal pain.  Musculoskeletal: Negative for neck pain and neck stiffness.       Lt thigh pain on flexion.   Neurological: Negative for dizziness, tremors, seizures, syncope, facial asymmetry, speech difficulty, weakness, light-headedness, numbness and headaches.  Psychiatric/Behavioral: Negative for behavioral problems, confusion and agitation. The patient is not nervous/anxious.    Past Medical History  Diagnosis Date  . Hypertension   . Hyperlipidemia     History   Social History  . Marital Status: Married    Spouse Name: N/A  . Number of Children: Y  . Years of Education: N/A   Occupational History  . [employer]   .     Social History Main Topics  . Smoking status: Never Smoker   . Smokeless tobacco: Never Used  . Alcohol Use: No  . Drug Use: No  . Sexual Activity:    Partners: Male   Other Topics Concern  . Not on file   Social History Narrative   Regular Exercise- qd     Past Surgical History  Procedure Laterality Date  . Knee surgery      bilat  . Adenoidectomy  08/2011    [place]--- gso ent  . Closed reduction hand fracture  12/2010    Right hand    Family History  Problem Relation Age of Onset  . Hypertension    . Hypertension  Mother   . Cancer Mother 47    breast  . Hypertension Father   . Hyperlipidemia Father   . Stroke Father   . Cancer Father     prostate  . Colon cancer Father   . Hypertension Sister   . Cancer Maternal Aunt 25    breast  . Heart disease Maternal Uncle     mi  . Hypertension Maternal Uncle   . Hypertension Maternal Grandmother   . Alzheimer's disease Maternal Grandmother   . Heart disease Maternal Grandfather     mi  . Hypertension Maternal Grandfather   . Hyperlipidemia Maternal Grandfather   . Stroke Maternal Grandfather   . Stroke Maternal Uncle   . Hypertension Maternal Uncle     No Known Allergies  Current Outpatient Prescriptions on File Prior to Visit  Medication Sig Dispense Refill  . amLODipine (NORVASC) 5 MG tablet Take 1 tablet (5 mg total) by mouth daily. 90 tablet 3  . atorvastatin (LIPITOR) 20 MG tablet TAKE 1 TAB BY MOUTH AT BEDTIME- 90 tablet 3  . benazepril (LOTENSIN) 20 MG tablet   4  . diclofenac (VOLTAREN) 75 MG EC tablet Take 1 tablet (75 mg total) by mouth 2 (two) times daily. 30 tablet 0  . traMADol (ULTRAM) 50 MG tablet Take 1 tablet (50 mg total) by mouth every 6 (six) hours as  needed. 12 tablet 0   No current facility-administered medications on file prior to visit.    BP 162/88 mmHg  Pulse 61  Temp(Src) 99 F (37.2 C) (Oral)  Ht 6\' 1"  (1.854 m)  Wt 217 lb 9.6 oz (98.703 kg)  BMI 28.72 kg/m2  SpO2 98%       Objective:   Physical Exam  General Mental Status- Alert. General Appearance- Not in acute distress.   Skin General: Color- Normal Color. Moisture- Normal Moisture.  Neck Carotid Arteries- Normal color. Moisture- Normal Moisture. No carotid bruits. No JVD.  Chest and Lung Exam Auscultation: Breath Sounds:-Normal.  Cardiovascular Auscultation:Rythm- Regular. Murmurs & Other Heart Sounds:Auscultation of the heart reveals- No Murmurs.  Abdomen Inspection:-Inspeection Normal. Palpation/Percussion:Note:No mass. Palpation  and Percussion of the abdomen reveal- Non Tender, Non Distended + BS, no rebound or guarding.    Neurologic Cranial Nerve exam:- CN III-XII intact(No nystagmus), symmetric smile. tStrength:- 5/5 equal and symmetric strength both upper and lower extremities.  Lt thigh- still demonstrates lateral aspect thigh pain on flexion.      Assessment & Plan:

## 2015-02-17 NOTE — Assessment & Plan Note (Signed)
Left vastus lateralis tear - likely related to basketball though he does not recall an injury.  Start with compression sleeve, ice or heat, gentle stretching, diclofenac twice a day.  Reviewed strengthening exercises he can do when pain is less than a 3 on a scale of 1-10.  F/u in 2 weeks for reevaluation.

## 2015-02-28 ENCOUNTER — Other Ambulatory Visit (INDEPENDENT_AMBULATORY_CARE_PROVIDER_SITE_OTHER): Payer: BLUE CROSS/BLUE SHIELD

## 2015-02-28 DIAGNOSIS — I10 Essential (primary) hypertension: Secondary | ICD-10-CM

## 2015-03-01 ENCOUNTER — Ambulatory Visit (INDEPENDENT_AMBULATORY_CARE_PROVIDER_SITE_OTHER): Payer: BLUE CROSS/BLUE SHIELD | Admitting: Family Medicine

## 2015-03-01 DIAGNOSIS — M79652 Pain in left thigh: Secondary | ICD-10-CM | POA: Diagnosis not present

## 2015-03-01 LAB — COMPREHENSIVE METABOLIC PANEL
ALK PHOS: 70 U/L (ref 39–117)
ALT: 18 U/L (ref 0–53)
AST: 23 U/L (ref 0–37)
Albumin: 4.2 g/dL (ref 3.5–5.2)
BUN: 17 mg/dL (ref 6–23)
CO2: 32 mEq/L (ref 19–32)
CREATININE: 1.65 mg/dL — AB (ref 0.40–1.50)
Calcium: 9.4 mg/dL (ref 8.4–10.5)
Chloride: 103 mEq/L (ref 96–112)
GFR: 57.02 mL/min — ABNORMAL LOW (ref >60.00)
Glucose, Bld: 77 mg/dL (ref 70–99)
Potassium: 3.6 mEq/L (ref 3.5–5.1)
Sodium: 140 mEq/L (ref 135–145)
Total Bilirubin: 0.5 mg/dL (ref 0.2–1.2)
Total Protein: 7.5 g/dL (ref 6.0–8.3)

## 2015-03-01 NOTE — Patient Instructions (Signed)
You have a partial tear of your quad (specifically the vastus lateralis) with a hematoma. You are improving much quicker than expected. Continue compression sleeve for 4 more weeks with sports. Ice or heat at this point, whichever feels better, 15 minutes at a time 3-4 times a day as needed. Continue strengthening - straight leg raises, knee extensions, side leg raises 3 sets of 10 once a day for another 4 weeks. Aleve or ibuprofen as needed for pain and inflammation. Follow up with me in 4 weeks.

## 2015-03-02 NOTE — Assessment & Plan Note (Signed)
Left vastus lateralis tear - likely related to basketball though he does not recall an injury.  Much improved with compression sleeve, home exercises, stretches.  Given smaller compression sleeve.  Ice/heat as needed.  Continue strengthening for 4 more weeks.  NSAIDs as needed.  F/u in 4 weeks.

## 2015-03-02 NOTE — Progress Notes (Signed)
PCP: Garnet Koyanagi, DO Referred by: Mackie Pai PA  Subjective:   HPI: Patient is a 49 y.o. male here for left quad pain.  5/10: Patient reports about 2 weeks ago he started to get lateral left thigh pain. Recalls this starting after playing basketball but no injury he can recall when he was playing. Pain now 2/10 at rest, up to 9/10 with flexion of knee. Tried ibuprofen. Radiographs of femur negative.  5/24: Patient reports he feels much better - at 80% from last visit 2 weeks ago. Doing home exercises, stretches. Pain up to 3/10 at worst and typically when flexing knee fully. No swelling. Has been using heat. Compression sleeve feels too loose to use.  Past Medical History  Diagnosis Date  . Hypertension   . Hyperlipidemia     Current Outpatient Prescriptions on File Prior to Visit  Medication Sig Dispense Refill  . amLODipine (NORVASC) 10 MG tablet Take 1 tablet (10 mg total) by mouth daily. 30 tablet 1  . atorvastatin (LIPITOR) 20 MG tablet TAKE 1 TAB BY MOUTH AT BEDTIME- 90 tablet 3  . benazepril (LOTENSIN) 20 MG tablet   4  . diclofenac (VOLTAREN) 75 MG EC tablet Take 1 tablet (75 mg total) by mouth 2 (two) times daily. 30 tablet 0  . traMADol (ULTRAM) 50 MG tablet Take 1 tablet (50 mg total) by mouth every 6 (six) hours as needed. 12 tablet 0   No current facility-administered medications on file prior to visit.    Past Surgical History  Procedure Laterality Date  . Knee surgery      bilat  . Adenoidectomy  08/2011    Gore--- gso ent  . Closed reduction hand fracture  12/2010    Right hand    No Known Allergies  History   Social History  . Marital Status: Married    Spouse Name: N/A  . Number of Children: Y  . Years of Education: N/A   Occupational History  . [employer]   .     Social History Main Topics  . Smoking status: Never Smoker   . Smokeless tobacco: Never Used  . Alcohol Use: No  . Drug Use: No  . Sexual Activity:   Partners: Male   Other Topics Concern  . Not on file   Social History Narrative   Regular Exercise- qd     Family History  Problem Relation Age of Onset  . Hypertension    . Hypertension Mother   . Cancer Mother 70    breast  . Hypertension Father   . Hyperlipidemia Father   . Stroke Father   . Cancer Father     prostate  . Colon cancer Father   . Hypertension Sister   . Cancer Maternal Aunt 32    breast  . Heart disease Maternal Uncle     mi  . Hypertension Maternal Uncle   . Hypertension Maternal Grandmother   . Alzheimer's disease Maternal Grandmother   . Heart disease Maternal Grandfather     mi  . Hypertension Maternal Grandfather   . Hyperlipidemia Maternal Grandfather   . Stroke Maternal Grandfather   . Stroke Maternal Uncle   . Hypertension Maternal Uncle     There were no vitals taken for this visit.  Review of Systems: See HPI above.    Objective:  Physical Exam:  Gen: NAD  Left leg: No gross deformity, swelling, bruising. No TTP mid lateral left quadriceps area.  No palpable defect. FROM  knee without pain. FROM hip without pain. No pain on resisted knee extension. NVI distally. Able to bear weight comfortably.  MSK u/s:  Muscle defect noted within vastus lateralis with accompanying hematoma about half the size of two weeks ago.    Assessment & Plan:  1. Left vastus lateralis tear - likely related to basketball though he does not recall an injury.  Much improved with compression sleeve, home exercises, stretches.  Given smaller compression sleeve.  Ice/heat as needed.  Continue strengthening for 4 more weeks.  NSAIDs as needed.  F/u in 4 weeks.

## 2015-03-10 ENCOUNTER — Ambulatory Visit (INDEPENDENT_AMBULATORY_CARE_PROVIDER_SITE_OTHER): Payer: BLUE CROSS/BLUE SHIELD | Admitting: Medical

## 2015-03-10 ENCOUNTER — Encounter: Payer: Self-pay | Admitting: Medical

## 2015-03-10 VITALS — BP 138/85 | HR 63 | Temp 99.0°F | Ht 73.0 in | Wt 216.8 lb

## 2015-03-10 DIAGNOSIS — J301 Allergic rhinitis due to pollen: Secondary | ICD-10-CM

## 2015-03-10 DIAGNOSIS — J309 Allergic rhinitis, unspecified: Secondary | ICD-10-CM | POA: Insufficient documentation

## 2015-03-10 MED ORDER — LORATADINE 10 MG PO TABS: 10.0000 mg | ORAL_TABLET | Freq: Every day | ORAL | Status: DC

## 2015-03-10 MED ORDER — FLUTICASONE PROPIONATE 50 MCG/ACT NA SUSP: 2.0000 | Freq: Every day | NASAL | Status: DC

## 2015-03-10 NOTE — Progress Notes (Signed)
Pre visit review using our clinic review tool, if applicable. No additional management support is needed unless otherwise documented below in the visit note. 

## 2015-03-10 NOTE — Assessment & Plan Note (Signed)
Better today when I checked compared to prior level on last visit. Continue current regimen. I want you to check bp levels and get new battery in your machine. And document readings. Call us in 2 wks with readings.

## 2015-03-10 NOTE — Assessment & Plan Note (Signed)
Flonase rx and claritin rx.

## 2015-03-10 NOTE — Patient Instructions (Signed)
HYPERTENSION Better today when I checked compared to prior level on last visit. Continue current regimen. I want you to check bp levels and get new battery in your machine. And document readings. Call us in 2 wks with readings.   Allergic rhinitis Flonase rx and claritin rx.    Follow up in 3 moths or as needed.

## 2015-03-10 NOTE — Progress Notes (Signed)
Subjective:    Patient ID: Bobby Castaneda, male    DOB: 1966-07-02, 49 y.o.   MRN: [id number]  HPI   Pt in for bp check. When he checks his bp levels. He gets reading 497-026 systolic. Diastolic 37-85. Pt thinks his battery may be low on machine at home.  No cardiac or neurologic signs or symptoms.   Pnd. rhinnorhra and faint st. X 1 wk.    Review of Systems  Constitutional: Negative for fever, chills, diaphoresis, activity change and fatigue.  HENT: Positive for postnasal drip, rhinorrhea and sore throat. Negative for congestion, ear pain, hearing loss and nosebleeds.        Minimal faint st.  Respiratory: Negative for cough, chest tightness and shortness of breath.   Cardiovascular: Negative for chest pain, palpitations and leg swelling.  Gastrointestinal: Negative for nausea, vomiting and abdominal pain.  Musculoskeletal: Negative for neck pain and neck stiffness.  Neurological: Negative for dizziness, tremors, seizures, syncope, facial asymmetry, speech difficulty, weakness, light-headedness, numbness and headaches.  Psychiatric/Behavioral: Negative for behavioral problems, confusion and agitation. The patient is not nervous/anxious.     Past Medical History  Diagnosis Date  . Hypertension   . Hyperlipidemia     History   Social History  . Marital Status: Married    Spouse Name: N/A  . Number of Children: Y  . Years of Education: N/A   Occupational History  . [employer]   .     Social History Main Topics  . Smoking status: Never Smoker   . Smokeless tobacco: Never Used  . Alcohol Use: No  . Drug Use: No  . Sexual Activity:    Partners: Male   Other Topics Concern  . Not on file   Social History Narrative   Regular Exercise- qd     Past Surgical History  Procedure Laterality Date  . Knee surgery      bilat  . Adenoidectomy  08/2011    [place]ore--- gso ent  . Closed reduction hand fracture  12/2010    Right hand    Family History  Problem  Relation Age of Onset  . Hypertension    . Hypertension Mother   . Cancer Mother 13    breast  . Hypertension Father   . Hyperlipidemia Father   . Stroke Father   . Cancer Father     prostate  . Colon cancer Father   . Hypertension Sister   . Cancer Maternal Aunt 93    breast  . Heart disease Maternal Uncle     mi  . Hypertension Maternal Uncle   . Hypertension Maternal Grandmother   . Alzheimer's disease Maternal Grandmother   . Heart disease Maternal Grandfather     mi  . Hypertension Maternal Grandfather   . Hyperlipidemia Maternal Grandfather   . Stroke Maternal Grandfather   . Stroke Maternal Uncle   . Hypertension Maternal Uncle     No Known Allergies  Current Outpatient Prescriptions on File Prior to Visit  Medication Sig Dispense Refill  . amLODipine (NORVASC) 10 MG tablet Take 1 tablet (10 mg total) by mouth daily. 30 tablet 1  . atorvastatin (LIPITOR) 20 MG tablet TAKE 1 TAB BY MOUTH AT BEDTIME- 90 tablet 3  . benazepril (LOTENSIN) 20 MG tablet   4  . diclofenac (VOLTAREN) 75 MG EC tablet Take 1 tablet (75 mg total) by mouth 2 (two) times daily. 30 tablet 0   No current facility-administered medications on file prior to  visit.    BP 138/85 mmHg  Pulse 63  Temp(Src) 99 F (37.2 C) (Oral)  Ht 6\' 1"  (1.854 m)  Wt 216 lb 12.8 oz (98.34 kg)  BMI 28.61 kg/m2  SpO2 95%       Objective:   Physical Exam  General Mental Status- Alert. General Appearance- Not in acute distress.   Skin General: Color- Normal Color. Moisture- Normal Moisture.  Neck Carotid Arteries- Normal color. Moisture- Normal Moisture. No carotid bruits. No JVD.  Chest and Lung Exam Auscultation: Breath Sounds:-Normal. CTA  Cardiovascular Auscultation:Rythm- Regular, Rate and rhythm. Murmurs & Other Heart Sounds:Auscultation of the heart reveals- No Murmurs.  Abdomen Inspection:-Inspeection Normal. Palpation/Percussion:Note:No mass. Palpation and Percussion of the abdomen  reveal- Non Tender, Non Distended + BS, no rebound or guarding.    Neurologic Cranial Nerve exam:- CN III-XII intact(No nystagmus), symmetric smile. Strength:- 5/5 equal and symmetric strength both upper and lower extremities.  Heent- negative excpet pnd and boggy turbinates.      Assessment & Plan:

## 2015-03-11 ENCOUNTER — Other Ambulatory Visit: Payer: Self-pay | Admitting: Family Medicine

## 2015-03-14 ENCOUNTER — Other Ambulatory Visit: Payer: Self-pay

## 2015-03-14 MED ORDER — AMLODIPINE BESYLATE 10 MG PO TABS: 10.0000 mg | ORAL_TABLET | Freq: Every day | ORAL | Status: DC

## 2015-03-29 ENCOUNTER — Encounter: Payer: Self-pay | Admitting: Family Medicine

## 2015-03-29 ENCOUNTER — Ambulatory Visit (INDEPENDENT_AMBULATORY_CARE_PROVIDER_SITE_OTHER): Payer: BLUE CROSS/BLUE SHIELD | Admitting: Family Medicine

## 2015-03-29 VITALS — BP 134/85 | HR 71 | Ht 73.0 in | Wt 210.0 lb

## 2015-03-29 DIAGNOSIS — M79652 Pain in left thigh: Secondary | ICD-10-CM | POA: Diagnosis not present

## 2015-03-29 NOTE — Patient Instructions (Signed)
You are cleared for all sports without restrictions. I would do home exercises just 2-3 times a week for next 6 weeks. I'd wear the sleeve only with sports also for 6 weeks. Follow up with me as needed.

## 2015-03-31 NOTE — Progress Notes (Signed)
PCP: Garnet Koyanagi, DO Referred by: Mackie Pai PA  Subjective:   HPI: Patient is a 49 y.o. male here for left quad pain.  5/10: Patient reports about 2 weeks ago he started to get lateral left thigh pain. Recalls this starting after playing basketball but no injury he can recall when he was playing. Pain now 2/10 at rest, up to 9/10 with flexion of knee. Tried ibuprofen. Radiographs of femur negative.  5/24: Patient reports he feels much better - at 80% from last visit 2 weeks ago. Doing home exercises, stretches. Pain up to 3/10 at worst and typically when flexing knee fully. No swelling. Has been using heat. Compression sleeve feels too loose to use.  6/21: Patient reports he's much better. No pain currently. Back playing basketball without issues. Wearing thigh sleeve, compression shorts. Doing home exercises.  Past Medical History  Diagnosis Date  . Hypertension   . Hyperlipidemia     Current Outpatient Prescriptions on File Prior to Visit  Medication Sig Dispense Refill  . amLODipine (NORVASC) 10 MG tablet Take 1 tablet (10 mg total) by mouth daily. 90 tablet 1  . atorvastatin (LIPITOR) 20 MG tablet Take 1 tablet (20 mg total) by mouth at bedtime. Repeat labs are due now 90 tablet 0  . benazepril (LOTENSIN) 20 MG tablet   4  . diclofenac (VOLTAREN) 75 MG EC tablet Take 1 tablet (75 mg total) by mouth 2 (two) times daily. 30 tablet 0  . fluticasone (FLONASE) 50 MCG/ACT nasal spray Place 2 sprays into both nostrils daily. 16 g 1  . fluticasone (FLONASE) 50 MCG/ACT nasal spray Place 2 sprays into both nostrils daily. 16 g 1  . loratadine (CLARITIN) 10 MG tablet Take 1 tablet (10 mg total) by mouth daily. 30 tablet 0   No current facility-administered medications on file prior to visit.    Past Surgical History  Procedure Laterality Date  . Knee surgery      bilat  . Adenoidectomy  08/2011    [place]ore--- gso ent  . Closed reduction hand fracture  12/2010   Right hand    No Known Allergies  History   Social History  . Marital Status: Married    Spouse Name: N/A  . Number of Children: Y  . Years of Education: N/A   Occupational History  . [employer]   .     Social History Main Topics  . Smoking status: Never Smoker   . Smokeless tobacco: Never Used  . Alcohol Use: No  . Drug Use: No  . Sexual Activity:    Partners: Male   Other Topics Concern  . Not on file   Social History Narrative   Regular Exercise- qd     Family History  Problem Relation Age of Onset  . Hypertension    . Hypertension Mother   . Cancer Mother 40    breast  . Hypertension Father   . Hyperlipidemia Father   . Stroke Father   . Cancer Father     prostate  . Colon cancer Father   . Hypertension Sister   . Cancer Maternal Aunt 38    breast  . Heart disease Maternal Uncle     mi  . Hypertension Maternal Uncle   . Hypertension Maternal Grandmother   . Alzheimer's disease Maternal Grandmother   . Heart disease Maternal Grandfather     mi  . Hypertension Maternal Grandfather   . Hyperlipidemia Maternal Grandfather   . Stroke Maternal  Grandfather   . Stroke Maternal Uncle   . Hypertension Maternal Uncle     BP 134/85 mmHg  Pulse 71  Ht 6\' 1"  (1.854 m)  Wt 210 lb (95.255 kg)  BMI 27.71 kg/m2  Review of Systems: See HPI above.    Objective:  Physical Exam:  Gen: NAD  Left leg: No gross deformity, swelling, bruising. No TTP mid lateral left quadriceps area.  No palpable defect. FROM knee without pain. FROM hip without pain. No pain on resisted knee extension. NVI distally. Able to bear weight comfortably.    Assessment & Plan:  1. Left vastus lateralis tear - likely related to basketball though he does not recall an injury.  Significant improvement and back to his normal activities.  Discussed home exercises 3-4 times a week next 6 weeks, sleeve only with sports for 6 weeks.  F/u prn.

## 2015-03-31 NOTE — Assessment & Plan Note (Signed)
Left vastus lateralis tear - likely related to basketball though he does not recall an injury.  Significant improvement and back to his normal activities.  Discussed home exercises 3-4 times a week next 6 weeks, sleeve only with sports for 6 weeks.  F/u prn.

## 2015-06-27 ENCOUNTER — Other Ambulatory Visit: Payer: Self-pay | Admitting: Family Medicine

## 2015-06-28 NOTE — Telephone Encounter (Signed)
Refills of atorvastatin and benazepril sent to pharmacy.  Pt last seen by PCP on 08/2014 and advised 6 month f/u.  Pt is past due.  Please scheduled pt fasting f/u with Dr Etter Sjogren. Thanks!

## 2015-07-04 ENCOUNTER — Ambulatory Visit (INDEPENDENT_AMBULATORY_CARE_PROVIDER_SITE_OTHER): Payer: BC Managed Care – PPO | Admitting: Family Medicine

## 2015-07-04 ENCOUNTER — Encounter: Payer: Self-pay | Admitting: Family Medicine

## 2015-07-04 ENCOUNTER — Ambulatory Visit (HOSPITAL_BASED_OUTPATIENT_CLINIC_OR_DEPARTMENT_OTHER)
Admission: RE | Admit: 2015-07-04 | Discharge: 2015-07-04 | Disposition: A | Discharge status: Home / Self Care | Payer: BC Managed Care – PPO | Source: Ambulatory Visit | Attending: Family Medicine | Admitting: Family Medicine

## 2015-07-04 VITALS — BP 144/89 | HR 55 | Ht 73.0 in | Wt 215.0 lb

## 2015-07-04 DIAGNOSIS — M25522 Pain in left elbow: Secondary | ICD-10-CM | POA: Insufficient documentation

## 2015-07-04 DIAGNOSIS — S59902A Unspecified injury of left elbow, initial encounter: Secondary | ICD-10-CM | POA: Diagnosis not present

## 2015-07-04 NOTE — Patient Instructions (Signed)
You have a triceps tendon strain, likely had a traumatic olecranon bursitis as well though there's not enough fluid here to drain it. Icing 15 minutes at a time 3-4 times a day. Ibuprofen 600mg  three times a day OR aleve 2 tabs twice a day with food for pain and inflammation as needed. Sleeve for compression. Start with light weight when doing triceps work. Follow up with me in 4-6 weeks or as needed. Consider physical therapy if not improving.

## 2015-07-06 DIAGNOSIS — S59902A Unspecified injury of left elbow, initial encounter: Secondary | ICD-10-CM | POA: Insufficient documentation

## 2015-07-06 NOTE — Progress Notes (Signed)
PCP: Garnet Koyanagi, DO  Subjective:   HPI: Patient is a 49 y.o. male here for left elbow injury.  Patient reports about 6 weeks ago he was playing basketball, went up for a ball and fell directly on left elbow. Some swelling, bruising. Pain is 2/10 at rest now, up to 6/10 at times. No prior injuries. Right handed.  Past Medical History  Diagnosis Date  . Hypertension   . Hyperlipidemia     Current Outpatient Prescriptions on File Prior to Visit  Medication Sig Dispense Refill  . amLODipine (NORVASC) 10 MG tablet Take 1 tablet (10 mg total) by mouth daily. 90 tablet 1  . atorvastatin (LIPITOR) 20 MG tablet TAKE 1 TABLET BY MOUTH AT BEDTIME 90 tablet 0  . benazepril (LOTENSIN) 40 MG tablet TAKE 1 TABLET (40 MG) BY MOUTH DAILY. 90 tablet 0  . diclofenac (VOLTAREN) 75 MG EC tablet Take 1 tablet (75 mg total) by mouth 2 (two) times daily. 30 tablet 0  . fluticasone (FLONASE) 50 MCG/ACT nasal spray Place 2 sprays into both nostrils daily. 16 g 1  . loratadine (CLARITIN) 10 MG tablet Take 1 tablet (10 mg total) by mouth daily. 30 tablet 0   No current facility-administered medications on file prior to visit.    Past Surgical History  Procedure Laterality Date  . Knee surgery      bilat  . Adenoidectomy  08/2011    [place]ore--- gso ent  . Closed reduction hand fracture  12/2010    Right hand    No Known Allergies  Social History   Social History  . Marital Status: Married    Spouse Name: N/A  . Number of Children: Y  . Years of Education: N/A   Occupational History  . [employer]   .     Social History Main Topics  . Smoking status: Never Smoker   . Smokeless tobacco: Never Used  . Alcohol Use: No  . Drug Use: No  . Sexual Activity:    Partners: Male   Other Topics Concern  . Not on file   Social History Narrative   Regular Exercise- qd     Family History  Problem Relation Age of Onset  . Hypertension    . Hypertension Mother   . Cancer Mother 59   breast  . Hypertension Father   . Hyperlipidemia Father   . Stroke Father   . Cancer Father     prostate  . Colon cancer Father   . Hypertension Sister   . Cancer Maternal Aunt 66    breast  . Heart disease Maternal Uncle     mi  . Hypertension Maternal Uncle   . Hypertension Maternal Grandmother   . Alzheimer's disease Maternal Grandmother   . Heart disease Maternal Grandfather     mi  . Hypertension Maternal Grandfather   . Hyperlipidemia Maternal Grandfather   . Stroke Maternal Grandfather   . Stroke Maternal Uncle   . Hypertension Maternal Uncle     BP 144/89 mmHg  Pulse 55  Ht 6\' 1"  (5.277 m)  Wt 215 lb (97.523 kg)  BMI 28.37 kg/m2  Review of Systems: See HPI above.    Objective:  Physical Exam:  Gen: NAD  Left elbow: No palpable or visible bursitis.  No bruising, other deformity. Mild TTP over olecranon bursa area - some fragmentation felt here. FROM with minimal pain on elbow extension. Collateral ligaments intact. NVI distally.  MSK u/s:  Calcifications noted in triceps  tendon.  No fluid pocket of olecranon bursa    Assessment & Plan:  1. Left elbow injury - consistent with triceps tendon strain, traumatic olecranon bursitis that appears to have largely resolved.  Icing, nsaids, compression sleeve.  Start with light weight triceps exercises.  F/u in 4-6 weeks or as needed.  Consider physical therapy if not improving.

## 2015-07-06 NOTE — Assessment & Plan Note (Signed)
consistent with triceps tendon strain, traumatic olecranon bursitis that appears to have largely resolved.  Icing, nsaids, compression sleeve.  Start with light weight triceps exercises.  F/u in 4-6 weeks or as needed.  Consider physical therapy if not improving.

## 2015-07-19 ENCOUNTER — Encounter: Payer: Self-pay | Admitting: Family Medicine

## 2015-07-19 ENCOUNTER — Ambulatory Visit (INDEPENDENT_AMBULATORY_CARE_PROVIDER_SITE_OTHER): Payer: BC Managed Care – PPO | Admitting: Family Medicine

## 2015-07-19 VITALS — BP 140/86 | HR 58 | Temp 98.3°F | Ht 71.0 in | Wt 218.4 lb

## 2015-07-19 DIAGNOSIS — E785 Hyperlipidemia, unspecified: Secondary | ICD-10-CM | POA: Diagnosis not present

## 2015-07-19 DIAGNOSIS — Z23 Encounter for immunization: Secondary | ICD-10-CM

## 2015-07-19 DIAGNOSIS — I10 Essential (primary) hypertension: Secondary | ICD-10-CM | POA: Diagnosis not present

## 2015-07-19 DIAGNOSIS — G510 Bell's palsy: Secondary | ICD-10-CM

## 2015-07-19 DIAGNOSIS — Z0000 Encounter for general adult medical examination without abnormal findings: Secondary | ICD-10-CM | POA: Diagnosis not present

## 2015-07-19 LAB — POCT URINALYSIS DIPSTICK
BILIRUBIN UA: NEGATIVE
Blood, UA: NEGATIVE
Glucose, UA: NEGATIVE
Ketones, UA: NEGATIVE
LEUKOCYTES UA: NEGATIVE
Nitrite, UA: NEGATIVE
Protein, UA: NEGATIVE
Spec Grav, UA: 1.025
Urobilinogen, UA: 0.2
pH, UA: 6.5

## 2015-07-19 MED ORDER — BENAZEPRIL HCL 40 MG PO TABS: 40.0000 mg | ORAL_TABLET | Freq: Every day | ORAL | Status: DC

## 2015-07-19 MED ORDER — ATORVASTATIN CALCIUM 20 MG PO TABS: 20.0000 mg | ORAL_TABLET | Freq: Every day | ORAL | Status: DC

## 2015-07-19 MED ORDER — AMLODIPINE BESYLATE 10 MG PO TABS: 10.0000 mg | ORAL_TABLET | Freq: Every day | ORAL | Status: DC

## 2015-07-19 NOTE — Progress Notes (Signed)
Pre visit review using our clinic review tool, if applicable. No additional management support is needed unless otherwise documented below in the visit note. 

## 2015-07-19 NOTE — Progress Notes (Signed)
Patient ID: Bobby Castaneda., male    DOB: Feb 15, 1966  Age: 49 y.o. MRN: [id number]    Subjective:  Subjective HPI Bobby Castaneda. presents for cpe.  Pt had lost his insurance and was not able to go to the neuro for bells palsy.     Review of Systems  Constitutional: Negative.  Negative for diaphoresis, appetite change, fatigue and unexpected weight change.  HENT: Negative for congestion, ear pain, hearing loss, nosebleeds, postnasal drip, rhinorrhea, sinus pressure, sneezing and tinnitus.   Eyes: Negative for photophobia, pain, discharge, redness, itching and visual disturbance.  Respiratory: Negative.  Negative for cough, chest tightness, shortness of breath and wheezing.   Cardiovascular: Negative.  Negative for chest pain, palpitations and leg swelling.  Gastrointestinal: Negative for abdominal pain, constipation, blood in stool, abdominal distention and anal bleeding.  Endocrine: Negative.  Negative for cold intolerance, heat intolerance, polydipsia, polyphagia and polyuria.  Genitourinary: Negative.  Negative for dysuria, frequency and difficulty urinating.  Musculoskeletal: Negative.   Skin: Negative.   Allergic/Immunologic: Negative.   Neurological: Negative for dizziness, weakness, light-headedness, numbness and headaches.  Psychiatric/Behavioral: Negative for suicidal ideas, confusion, sleep disturbance, dysphoric mood, decreased concentration and agitation. The patient is not nervous/anxious.   All other systems reviewed and are negative.   History Past Medical History  Diagnosis Date  . Hypertension   . Hyperlipidemia     He has past surgical history that includes Knee surgery; Adenoidectomy (08/2011); and Closed reduction hand fracture (12/2010).   His family history includes Alzheimer's disease in his maternal grandmother; Cancer in his father; Cancer (age of onset: 45) in his maternal aunt; Cancer (age of onset: 35) in his mother; Colon cancer in his father; Heart  disease in his maternal grandfather and maternal uncle; Hyperlipidemia in his father and maternal grandfather; Hypertension in his father, maternal grandfather, maternal grandmother, maternal uncle, maternal uncle, mother, sister, and another family member; Stroke in his father, maternal grandfather, and maternal uncle.He reports that he has never smoked. He has never used smokeless tobacco. He reports that he does not drink alcohol or use illicit drugs.  No current outpatient prescriptions on file prior to visit.   No current facility-administered medications on file prior to visit.     Objective:  Objective Physical Exam  Constitutional: He is oriented to person, place, and time. He appears well-developed and well-nourished. No distress.  HENT:  Head: Normocephalic and atraumatic.  Right Ear: External ear normal.  Left Ear: External ear normal.  Nose: Nose normal.  Mouth/Throat: Oropharynx is clear and moist. No oropharyngeal exudate.  Eyes: Conjunctivae and EOM are normal. Pupils are equal, round, and reactive to light. Right eye exhibits no discharge. Left eye exhibits no discharge.  Neck: Normal range of motion. Neck supple. No JVD present. No thyromegaly present.  Cardiovascular: Normal rate, regular rhythm and intact distal pulses.  Exam reveals no gallop and no friction rub.   No murmur heard. Pulmonary/Chest: Effort normal and breath sounds normal. No respiratory distress. He has no wheezes. He has no rales. He exhibits no tenderness.  Abdominal: Soft. Bowel sounds are normal. He exhibits no distension and no mass. There is no tenderness. There is no rebound and no guarding.  Genitourinary: Rectum normal, prostate normal and penis normal. Guaiac negative stool.  Musculoskeletal: Normal range of motion. He exhibits no edema or tenderness.  Lymphadenopathy:    He has no cervical adenopathy.  Neurological: He is alert and oriented to person, place, and time. He displays normal  reflexes. He exhibits normal muscle tone.  R facial droop-- residual from bells palsy  Skin: Skin is warm and dry. No rash noted. He is not diaphoretic. No erythema. No pallor.  Psychiatric: He has a normal mood and affect. His behavior is normal. Judgment and thought content normal.   BP 140/86 mmHg  Pulse 58  Temp(Src) 98.3 F (36.8 C) (Oral)  Ht 5' 11" (1.803 m)  Wt 218 lb 6.4 oz (99.066 kg)  BMI 30.47 kg/m2  SpO2 98% Wt Readings from Last 3 Encounters:  07/19/15 218 lb 6.4 oz (99.066 kg)  07/04/15 215 lb (97.523 kg)  03/29/15 210 lb (95.255 kg)     Lab Results  Component Value Date   WBC 3.4* 09/04/2014   HGB 13.4 09/04/2014   HCT 39.1 09/04/2014   PLT 238 09/04/2014   GLUCOSE 77 02/28/2015   CHOL 124 08/25/2014   TRIG 64.0 08/25/2014   HDL 45.80 08/25/2014   LDLCALC 65 08/25/2014   ALT 18 02/28/2015   AST 23 02/28/2015   NA 140 02/28/2015   K 3.6 02/28/2015   CL 103 02/28/2015   CREATININE 1.65* 02/28/2015   BUN 17 02/28/2015   CO2 32 02/28/2015   TSH 1.36 02/23/2014   PSA 2.42 02/23/2014   MICROALBUR 2.2* 02/17/2013    Dg Elbow Complete Left  07/04/2015   CLINICAL DATA:  Left elbow pain since falling on the elbow 8 weeks ago playing basketball.  EXAM: LEFT ELBOW - COMPLETE 3+ VIEW  COMPARISON:  None.  FINDINGS: Moderate posterior olecranon spur formation and triceps tendon calcification. Mild anterior olecranon spur formation. Mild anterior coracohumeral spur formation. No fracture, dislocation or effusion.  IMPRESSION: No fracture.  Degenerative changes.   Electronically Signed   By: Claudie Revering M.D.   On: 07/04/2015 16:52     Assessment & Plan:  Plan I have discontinued Mr. Mcbain diclofenac, loratadine, and fluticasone. I have also changed his benazepril and atorvastatin. Additionally, I am having him maintain his amLODipine.  Meds ordered this encounter  Medications  . benazepril (LOTENSIN) 40 MG tablet    Sig: Take 1 tablet (40 mg total) by mouth  daily.    Dispense:  90 tablet    Refill:  1  . atorvastatin (LIPITOR) 20 MG tablet    Sig: Take 1 tablet (20 mg total) by mouth at bedtime.    Dispense:  90 tablet    Refill:  1  . amLODipine (NORVASC) 10 MG tablet    Sig: Take 1 tablet (10 mg total) by mouth daily.    Dispense:  90 tablet    Refill:  1    Problem List Items Addressed This Visit    Bell's palsy   Relevant Orders   Ambulatory referral to Neurology    Other Visit Diagnoses    Preventative health care    -  Primary    Relevant Orders    Comp Met (CMET)    CBC with Differential/Platelet    HIV antibody    Lipid panel    POCT urinalysis dipstick (Completed)    PSA    TSH    Essential hypertension        Relevant Medications    benazepril (LOTENSIN) 40 MG tablet    atorvastatin (LIPITOR) 20 MG tablet    amLODipine (NORVASC) 10 MG tablet    Other Relevant Orders    Comp Met (CMET)    CBC with Differential/Platelet    HIV antibody  Lipid panel    POCT urinalysis dipstick (Completed)    PSA    TSH    Hyperlipidemia        Relevant Medications    benazepril (LOTENSIN) 40 MG tablet    atorvastatin (LIPITOR) 20 MG tablet    amLODipine (NORVASC) 10 MG tablet    Other Relevant Orders    Comp Met (CMET)    Lipid panel    Need for prophylactic vaccination and inoculation against influenza        Relevant Orders    Flu Vaccine QUAD 36+ mos PF IM (Fluarix & Fluzone Quad PF) (Completed)    Need for prophylactic vaccination with combined diphtheria-tetanus-pertussis (DTP) vaccine        Relevant Orders    Tdap vaccine greater than or equal to 7yo IM (Completed)       Follow-up: Return in about 6 months (around 01/17/2016), or if symptoms worsen or fail to improve, for hyperlipidemia, hypertension.  Garnet Koyanagi, DO

## 2015-07-19 NOTE — Patient Instructions (Signed)

## 2015-07-20 LAB — LIPID PANEL
CHOLESTEROL: 120 mg/dL (ref 0–200)
HDL: 42.5 mg/dL (ref >39.00)
LDL Cholesterol: 63 mg/dL (ref 0–99)
NonHDL: 77.42
TRIGLYCERIDES: 70 mg/dL (ref 0.0–149.0)
Total CHOL/HDL Ratio: 3
VLDL: 14 mg/dL (ref 0.0–40.0)

## 2015-07-20 LAB — PSA: PSA: 2.26 ng/mL (ref 0.10–4.00)

## 2015-07-20 LAB — CBC WITH DIFFERENTIAL/PLATELET
BASOS PCT: 1.7 % (ref 0.0–3.0)
Basophils Absolute: 0.1 10*3/uL (ref 0.0–0.1)
EOS ABS: 0.2 10*3/uL (ref 0.0–0.7)
Eosinophils Relative: 3.9 % (ref 0.0–5.0)
HCT: 38.2 % — ABNORMAL LOW (ref 39.0–52.0)
Hemoglobin: 12.8 g/dL — ABNORMAL LOW (ref 13.0–17.0)
Lymphocytes Relative: 49.2 % — ABNORMAL HIGH (ref 12.0–46.0)
Lymphs Abs: 2.2 10*3/uL (ref 0.7–4.0)
MCHC: 33.4 g/dL (ref 30.0–36.0)
MCV: 89 fl (ref 78.0–100.0)
MONO ABS: 0.4 10*3/uL (ref 0.1–1.0)
Monocytes Relative: 8.7 % (ref 3.0–12.0)
Neutro Abs: 1.6 10*3/uL (ref 1.4–7.7)
Neutrophils Relative %: 36.5 % — ABNORMAL LOW (ref 43.0–77.0)
Platelets: 234 10*3/uL (ref 150.0–400.0)
RBC: 4.3 Mil/uL (ref 4.22–5.81)
RDW: 13.3 % (ref 11.5–15.5)
WBC: 4.4 10*3/uL (ref 4.0–10.5)

## 2015-07-20 LAB — COMPREHENSIVE METABOLIC PANEL
ALBUMIN: 4.4 g/dL (ref 3.5–5.2)
ALK PHOS: 84 U/L (ref 39–117)
ALT: 15 U/L (ref 0–53)
AST: 20 U/L (ref 0–37)
BUN: 19 mg/dL (ref 6–23)
CO2: 32 mEq/L (ref 19–32)
CREATININE: 1.32 mg/dL (ref 0.40–1.50)
Calcium: 9.8 mg/dL (ref 8.4–10.5)
Chloride: 105 mEq/L (ref 96–112)
GFR: 73.65 mL/min (ref >60.00)
Glucose, Bld: 88 mg/dL (ref 70–99)
Potassium: 3.8 mEq/L (ref 3.5–5.1)
Sodium: 142 mEq/L (ref 135–145)
TOTAL PROTEIN: 7.9 g/dL (ref 6.0–8.3)
Total Bilirubin: 0.3 mg/dL (ref 0.2–1.2)

## 2015-07-20 LAB — HIV ANTIBODY (ROUTINE TESTING W REFLEX): HIV: NONREACTIVE

## 2015-07-20 LAB — TSH: TSH: 1.54 u[IU]/mL (ref 0.35–4.50)

## 2015-07-26 ENCOUNTER — Encounter: Payer: Self-pay | Admitting: *Deleted

## 2015-08-08 ENCOUNTER — Ambulatory Visit: Payer: BC Managed Care – PPO | Admitting: Family Medicine

## 2015-10-20 MED FILL — AMLODIPINE BESYLATE 10 MG T: 10 | 90 days supply | Qty: 90 | Fill #0

## 2015-10-20 MED FILL — ATORVASTATIN 20 MG TABLET: 20 | 90 days supply | Qty: 90 | Fill #0

## 2015-10-20 MED FILL — BENAZEPRIL HCL 40 MG TABLET: 40 | 90 days supply | Qty: 90 | Fill #0

## 2015-10-28 ENCOUNTER — Ambulatory Visit (INDEPENDENT_AMBULATORY_CARE_PROVIDER_SITE_OTHER): Payer: BC Managed Care – PPO | Admitting: Neurology

## 2015-10-28 ENCOUNTER — Encounter: Payer: Self-pay | Admitting: Neurology

## 2015-10-28 VITALS — BP 126/84 | HR 63 | Ht 73.0 in | Wt 228.0 lb

## 2015-10-28 DIAGNOSIS — G510 Bell's palsy: Secondary | ICD-10-CM | POA: Diagnosis not present

## 2015-10-28 NOTE — Patient Instructions (Signed)
1. Continue eye care and facial massages 2. Follow-up on as needed basis, call for any changes

## 2015-10-28 NOTE — Progress Notes (Signed)
NEUROLOGY FOLLOW UP OFFICE NOTE  Bobby Castaneda. [id number]  HISTORY OF PRESENT ILLNESS: I had the pleasure of seeing Bobby Castaneda in follow-up in the neurology clinic on 10/28/2015.  The patient was last seen in March 2015 for right Bell's palsy. He continues to have right facial weakness and asks about prognosis today. He has occasional right facial tingling. He denies any facial pain, ear pain. He denies any headaches, dizziness, focal numbness/tingling/weakness. No falls. He was unable to do MRI.  HPI: This is a very pleasant 50 yo RH man with a history of hypertension and hyperlipidemia in his usual state of health until he woke up with right-sided facial weakness in August 2014. He received a course of oral steroids and noted slow improvement in symptoms. He is able to lift his eyebrow better and can drink out of cup without drooling. However he has noted that he still bites inside his right cheek. Around 4 months after the facial weakness, he started having numbness and tingling over the right cheek. He also has blurred vision and tearing in the right eye, particularly noticeable when he is playing sports. He has some fullness in the right ear, no pain. He denies any involvement of the arms or legs.There is no family history of similar symptoms  PAST MEDICAL HISTORY: Past Medical History  Diagnosis Date  . Hypertension   . Hyperlipidemia     MEDICATIONS: Current Outpatient Prescriptions on File Prior to Visit  Medication Sig Dispense Refill  . amLODipine (NORVASC) 10 MG tablet Take 1 tablet (10 mg total) by mouth daily. 90 tablet 1  . atorvastatin (LIPITOR) 20 MG tablet Take 1 tablet (20 mg total) by mouth at bedtime. 90 tablet 1  . benazepril (LOTENSIN) 40 MG tablet Take 1 tablet (40 mg total) by mouth daily. 90 tablet 1   No current facility-administered medications on file prior to visit.    ALLERGIES: No Known Allergies  FAMILY HISTORY: Family History    Problem Relation Age of Onset  . Hypertension    . Hypertension Mother   . Cancer Mother 28    breast  . Hypertension Father   . Hyperlipidemia Father   . Stroke Father   . Cancer Father     prostate  . Colon cancer Father   . Hypertension Sister   . Cancer Maternal Aunt 71    breast  . Heart disease Maternal Uncle     mi  . Hypertension Maternal Uncle   . Hypertension Maternal Grandmother   . Alzheimer's disease Maternal Grandmother   . Heart disease Maternal Grandfather     mi  . Hypertension Maternal Grandfather   . Hyperlipidemia Maternal Grandfather   . Stroke Maternal Grandfather   . Stroke Maternal Uncle   . Hypertension Maternal Uncle     SOCIAL HISTORY: Social History   Social History  . Marital Status: Married    Spouse Name: N/A  . Number of Children: Y  . Years of Education: N/A   Occupational History  . [employer]   .     Social History Main Topics  . Smoking status: Never Smoker   . Smokeless tobacco: Never Used  . Alcohol Use: No  . Drug Use: No  . Sexual Activity:    Partners: Male   Other Topics Concern  . Not on file   Social History Narrative   Regular Exercise- qd     REVIEW OF SYSTEMS: Constitutional: No fevers, chills, or sweats, no  generalized fatigue, change in appetite Eyes: No visual changes, double vision, eye pain Ear, nose and throat: No hearing loss, ear pain, nasal congestion, sore throat Cardiovascular: No chest pain, palpitations Respiratory:  No shortness of breath at rest or with exertion, wheezes GastrointestinaI: No nausea, vomiting, diarrhea, abdominal pain, fecal incontinence Genitourinary:  No dysuria, urinary retention or frequency Musculoskeletal:  No neck pain, back pain Integumentary: No rash, pruritus, skin lesions Neurological: as above Psychiatric: No depression, insomnia, anxiety Endocrine: No palpitations, fatigue, diaphoresis, mood swings, change in appetite, change in weight, increased  thirst Hematologic/Lymphatic:  No anemia, purpura, petechiae. Allergic/Immunologic: no itchy/runny eyes, nasal congestion, recent allergic reactions, rashes  PHYSICAL EXAM: Filed Vitals:   10/28/15 1616  BP: 126/84  Pulse: 63   General: No acute distress Head:  Normocephalic/atraumatic Neck: supple, no paraspinal tenderness, full range of motion Heart:  Regular rate and rhythm Lungs:  Clear to auscultation bilaterally Back: No paraspinal tenderness Skin/Extremities: No rash, no edema Neurological Exam: alert and oriented to person, place, and time. No aphasia or dysarthria. Fund of knowledge is appropriate.  Recent and remote memory are intact.  Attention and concentration are normal.    Able to name objects and repeat phrases. Cranial nerves: Pupils equal, round, reactive to light.  Fundoscopic exam unremarkable, no papilledema. Extraocular movements intact with no nystagmus. Visual fields full. Facial sensation intact. Moderate movement of right frontalis, complete eye closure with effort, mouth asymmetric on right with maximum effort, note of contracture on right nasolabial fold (similar to prior). No synkinesis. House-Brackmann grade III. Tongue, uvula, palate midline.  Motor: Bulk and tone normal, muscle strength 5/5 throughout with no pronator drift.  Sensation to light touch intact.  No extinction to double simultaneous stimulation.  Deep tendon reflexes 2+ throughout, toes downgoing.  Finger to nose testing intact.  Gait narrow-based and steady, able to tandem walk adequately.  Romberg negative.  IMPRESSION: This is a 50 yo RH man with a history of hypertension, hyperlipidemia, and right Bell's palsy since August 2014. He was last seen more than a year ago and presents today asking about prognosis as there has not been much change in facial weakness. We discussed that at this point, the likelihood for complete recovery at this point is very low. We discussed the importance of eye care  and risks for corneal abrasions due to dry eye.He has seen his eye doctor. He knows to call our office for any changes and will follow-up on a prn basis.   Thank you for allowing me to participate in his care.  Please do not hesitate to call for any questions or concerns.  The duration of this appointment visit was 15 minutes of face-to-face time with the patient.  Greater than 50% of this time was spent in counseling, explanation of diagnosis, planning of further management, and coordination of care.   Ellouise Newer, M.D.   CC: Dr. Etter Sjogren

## 2015-11-03 ENCOUNTER — Encounter: Payer: Self-pay | Admitting: Neurology

## 2016-01-19 ENCOUNTER — Ambulatory Visit: Payer: BC Managed Care – PPO | Admitting: Family Medicine

## 2016-01-19 ENCOUNTER — Telehealth: Payer: Self-pay | Admitting: Family Medicine

## 2016-01-19 NOTE — Telephone Encounter (Signed)
No charge. 

## 2016-01-19 NOTE — Telephone Encounter (Signed)
Pt called in at 2:00 to inform provider that he was stuck into the [place].     Pt called back in at 4:30 stating that he is on his way to his appt. Informed pt that he is to late and would need to reschedule. Scheduled pt for Tuesday 01/19/16 at 4:30p.    Pt says that he cant afford a no show fee.   Please advise.

## 2016-01-26 MED FILL — BENAZEPRIL HCL 40 MG TABLET: 40 | 90 days supply | Qty: 90 | Fill #1

## 2016-01-26 MED FILL — AMLODIPINE BESYLATE 10 MG T: 10 | 90 days supply | Qty: 90 | Fill #1

## 2016-01-26 MED FILL — ATORVASTATIN 20 MG TABLET: 20 | 90 days supply | Qty: 90 | Fill #1

## 2016-02-07 ENCOUNTER — Encounter: Payer: Self-pay | Admitting: Family Medicine

## 2016-02-07 ENCOUNTER — Ambulatory Visit (INDEPENDENT_AMBULATORY_CARE_PROVIDER_SITE_OTHER): Payer: BC Managed Care – PPO | Admitting: Family Medicine

## 2016-02-07 VITALS — BP 136/80 | HR 55 | Temp 98.7°F | Ht 71.0 in | Wt 220.2 lb

## 2016-02-07 DIAGNOSIS — G4733 Obstructive sleep apnea (adult) (pediatric): Secondary | ICD-10-CM

## 2016-02-07 DIAGNOSIS — E785 Hyperlipidemia, unspecified: Secondary | ICD-10-CM | POA: Diagnosis not present

## 2016-02-07 DIAGNOSIS — N181 Chronic kidney disease, stage 1: Secondary | ICD-10-CM | POA: Diagnosis not present

## 2016-02-07 DIAGNOSIS — I10 Essential (primary) hypertension: Secondary | ICD-10-CM | POA: Diagnosis not present

## 2016-02-07 NOTE — Patient Instructions (Signed)

## 2016-02-07 NOTE — Progress Notes (Signed)
Patient ID: Bobby Castaneda., male    DOB: 14-Aug-1966  Age: 50 y.o. MRN: [id number]    Subjective:  Subjective HPI Bobby Castaneda. presents for f/u hyperlipidemia and bp.   Nephrology is asking for labs as well.    Review of Systems  Constitutional: Negative for diaphoresis, appetite change, fatigue and unexpected weight change.  Eyes: Negative for pain, redness and visual disturbance.  Respiratory: Negative for cough, chest tightness, shortness of breath and wheezing.   Cardiovascular: Negative for chest pain, palpitations and leg swelling.  Endocrine: Negative for cold intolerance, heat intolerance, polydipsia, polyphagia and polyuria.  Genitourinary: Negative for dysuria, frequency and difficulty urinating.  Neurological: Negative for dizziness, light-headedness, numbness and headaches.    History Past Medical History  Diagnosis Date  . Hypertension   . Hyperlipidemia     He has past surgical history that includes Knee surgery; Adenoidectomy (08/2011); and Closed reduction hand fracture (12/2010).   His family history includes Alzheimer's disease in his maternal grandmother; Cancer in his father; Cancer (age of onset: 69) in his maternal aunt; Cancer (age of onset: 75) in his mother; Colon cancer in his father; Heart disease in his maternal grandfather and maternal uncle; Hyperlipidemia in his father and maternal grandfather; Hypertension in his father, maternal grandfather, maternal grandmother, maternal uncle, maternal uncle, mother, and sister; Stroke in his father, maternal grandfather, and maternal uncle.He reports that he has never smoked. He has never used smokeless tobacco. He reports that he does not drink alcohol or use illicit drugs.  Current Outpatient Prescriptions on File Prior to Visit  Medication Sig Dispense Refill  . atorvastatin (LIPITOR) 20 MG tablet Take 1 tablet (20 mg total) by mouth at bedtime. 90 tablet 1   No current facility-administered medications  on file prior to visit.     Objective:  Objective Physical Exam  Constitutional: He is oriented to person, place, and time. Vital signs are normal. He appears well-developed and well-nourished. He is sleeping.  HENT:  Head: Normocephalic and atraumatic.  Mouth/Throat: Oropharynx is clear and moist.  Eyes: EOM are normal. Pupils are equal, round, and reactive to light.  Neck: Normal range of motion. Neck supple. No thyromegaly present.  Cardiovascular: Normal rate and regular rhythm.   No murmur heard. Pulmonary/Chest: Effort normal and breath sounds normal. No respiratory distress. He has no wheezes. He has no rales. He exhibits no tenderness.  Musculoskeletal: He exhibits no edema or tenderness.  Neurological: He is alert and oriented to person, place, and time.  Skin: Skin is warm and dry.  Psychiatric: He has a normal mood and affect. His behavior is normal. Judgment and thought content normal.  Nursing note and vitals reviewed.  BP 136/80 mmHg  Pulse 55  Temp(Src) 98.7 F (37.1 C) (Oral)  Ht 5\' 11"  (1.803 m)  Wt 220 lb 3.2 oz (99.882 kg)  BMI 30.73 kg/m2  SpO2 97% Wt Readings from Last 3 Encounters:  02/07/16 220 lb 3.2 oz (99.882 kg)  10/28/15 228 lb (103.42 kg)  07/19/15 218 lb 6.4 oz (99.066 kg)     Lab Results  Component Value Date   WBC 4.4 07/19/2015   HGB 12.8* 07/19/2015   HCT 38.2* 07/19/2015   PLT 234.0 07/19/2015   GLUCOSE 88 07/19/2015   CHOL 120 07/19/2015   TRIG 70.0 07/19/2015   HDL 42.50 07/19/2015   LDLCALC 63 07/19/2015   ALT 15 07/19/2015   AST 20 07/19/2015   NA 142 07/19/2015   K 3.8 07/19/2015  CL 105 07/19/2015   CREATININE 1.32 07/19/2015   BUN 19 07/19/2015   CO2 32 07/19/2015   TSH 1.54 07/19/2015   PSA 2.26 07/19/2015   MICROALBUR 2.2* 02/17/2013    Dg Elbow Complete Left  07/04/2015  CLINICAL DATA:  Left elbow pain since falling on the elbow 8 weeks ago playing basketball. EXAM: LEFT ELBOW - COMPLETE 3+ VIEW COMPARISON:   None. FINDINGS: Moderate posterior olecranon spur formation and triceps tendon calcification. Mild anterior olecranon spur formation. Mild anterior coracohumeral spur formation. No fracture, dislocation or effusion. IMPRESSION: No fracture.  Degenerative changes. Electronically Signed   By: Claudie Revering M.D.   On: 07/04/2015 16:52     Assessment & Plan:  Plan I am having Bobby Castaneda maintain his atorvastatin, benazepril, and amLODipine.  Meds ordered this encounter  Medications  . benazepril (LOTENSIN) 40 MG tablet    Sig: Take 1 tablet (40 mg total) by mouth daily.    Dispense:  90 tablet    Refill:  1  . amLODipine (NORVASC) 10 MG tablet    Sig: Take 1 tablet (10 mg total) by mouth daily.    Dispense:  90 tablet    Refill:  1    Problem List Items Addressed This Visit      Unprioritized   Hyperlipidemia    Check labs  con't meds       Relevant Medications   benazepril (LOTENSIN) 40 MG tablet   amLODipine (NORVASC) 10 MG tablet   Other Relevant Orders   Comprehensive metabolic panel   Lipid panel   Essential hypertension    Stable  con't meds      Relevant Medications   benazepril (LOTENSIN) 40 MG tablet   amLODipine (NORVASC) 10 MG tablet   Other Relevant Orders   CBC with Differential/Platelet   OSA (obstructive sleep apnea) - Primary    Pt feels his cpap is not working He has not seen pulmonary in a few years Will refer to pulm for f/u  Encouraged pt to call [facility] to to see if they can help--- that is where he received his machine      Relevant Orders   Ambulatory referral to Pulmonology    Other Visit Diagnoses    Chronic kidney disease, stage 1        Relevant Orders    CBC with Differential/Platelet    Comprehensive metabolic panel    Phosphorus       Follow-up: Return in about 6 months (around 08/09/2016), or if symptoms worsen or fail to improve, for hypertension, hyperlipidemia.  Ann Held, DO

## 2016-02-07 NOTE — Progress Notes (Signed)
Pre visit review using our clinic review tool, if applicable. No additional management support is needed unless otherwise documented below in the visit note. 

## 2016-02-08 DIAGNOSIS — N181 Chronic kidney disease, stage 1: Secondary | ICD-10-CM | POA: Insufficient documentation

## 2016-02-08 MED ORDER — AMLODIPINE BESYLATE 10 MG PO TABS: 10.0000 mg | ORAL_TABLET | Freq: Every day | ORAL | Status: DC

## 2016-02-08 MED ORDER — BENAZEPRIL HCL 40 MG PO TABS: 40.0000 mg | ORAL_TABLET | Freq: Every day | ORAL | Status: DC

## 2016-02-08 NOTE — Assessment & Plan Note (Signed)
Pt feels his cpap is not working He has not seen pulmonary in a few years Will refer to pulm for f/u  Encouraged pt to call [facility] to to see if they can help--- that is where he received his machine

## 2016-02-08 NOTE — Assessment & Plan Note (Signed)
Per nephrology Will get labs and fax to them

## 2016-02-08 NOTE — Assessment & Plan Note (Signed)
Stable con't meds 

## 2016-02-08 NOTE — Assessment & Plan Note (Signed)
Check labs con't meds 

## 2016-02-09 ENCOUNTER — Other Ambulatory Visit (INDEPENDENT_AMBULATORY_CARE_PROVIDER_SITE_OTHER): Payer: BC Managed Care – PPO

## 2016-02-09 DIAGNOSIS — E785 Hyperlipidemia, unspecified: Secondary | ICD-10-CM | POA: Diagnosis not present

## 2016-02-09 DIAGNOSIS — N181 Chronic kidney disease, stage 1: Secondary | ICD-10-CM

## 2016-02-09 DIAGNOSIS — I10 Essential (primary) hypertension: Secondary | ICD-10-CM | POA: Diagnosis not present

## 2016-02-10 LAB — PHOSPHORUS: Phosphorus: 3.3 mg/dL (ref 2.3–4.6)

## 2016-02-10 LAB — LIPID PANEL
CHOL/HDL RATIO: 3
Cholesterol: 119 mg/dL (ref 0–200)
HDL: 42.6 mg/dL (ref >39.00)
LDL Cholesterol: 52 mg/dL (ref 0–99)
NonHDL: 76.74
Triglycerides: 124 mg/dL (ref 0.0–149.0)
VLDL: 24.8 mg/dL (ref 0.0–40.0)

## 2016-02-10 LAB — COMPREHENSIVE METABOLIC PANEL
ALK PHOS: 76 U/L (ref 39–117)
ALT: 16 U/L (ref 0–53)
AST: 22 U/L (ref 0–37)
Albumin: 4.2 g/dL (ref 3.5–5.2)
BUN: 17 mg/dL (ref 6–23)
CHLORIDE: 103 meq/L (ref 96–112)
CO2: 30 meq/L (ref 19–32)
Calcium: 9.2 mg/dL (ref 8.4–10.5)
Creatinine, Ser: 1.3 mg/dL (ref 0.40–1.50)
GFR: 74.79 mL/min (ref >60.00)
GLUCOSE: 95 mg/dL (ref 70–99)
POTASSIUM: 3.4 meq/L — AB (ref 3.5–5.1)
SODIUM: 140 meq/L (ref 135–145)
Total Bilirubin: 0.4 mg/dL (ref 0.2–1.2)
Total Protein: 7.5 g/dL (ref 6.0–8.3)

## 2016-02-10 LAB — CBC WITH DIFFERENTIAL/PLATELET
BASOS PCT: 0.6 % (ref 0.0–3.0)
Basophils Absolute: 0 10*3/uL (ref 0.0–0.1)
EOS PCT: 3.3 % (ref 0.0–5.0)
Eosinophils Absolute: 0.2 10*3/uL (ref 0.0–0.7)
HCT: 37.6 % — ABNORMAL LOW (ref 39.0–52.0)
Hemoglobin: 12.5 g/dL — ABNORMAL LOW (ref 13.0–17.0)
LYMPHS ABS: 1.9 10*3/uL (ref 0.7–4.0)
Lymphocytes Relative: 42.2 % (ref 12.0–46.0)
MCHC: 33.3 g/dL (ref 30.0–36.0)
MCV: 88.2 fl (ref 78.0–100.0)
MONO ABS: 0.3 10*3/uL (ref 0.1–1.0)
Monocytes Relative: 7.3 % (ref 3.0–12.0)
NEUTROS ABS: 2.1 10*3/uL (ref 1.4–7.7)
NEUTROS PCT: 46.6 % (ref 43.0–77.0)
PLATELETS: 242 10*3/uL (ref 150.0–400.0)
RBC: 4.26 Mil/uL (ref 4.22–5.81)
RDW: 13.5 % (ref 11.5–15.5)
WBC: 4.6 10*3/uL (ref 4.0–10.5)

## 2016-04-27 ENCOUNTER — Other Ambulatory Visit: Payer: Self-pay | Admitting: Family Medicine

## 2016-04-27 MED FILL — BENAZEPRIL HCL 40 MG TABLET: 40 | 90 days supply | Qty: 90 | Fill #0

## 2016-04-27 MED FILL — ATORVASTATIN 20 MG TABLET: 20 | 90 days supply | Qty: 90 | Fill #0

## 2016-04-27 MED FILL — AMLODIPINE BESYLATE 10 MG T: 10 | 90 days supply | Qty: 90 | Fill #0

## 2016-05-09 ENCOUNTER — Encounter: Payer: Self-pay | Admitting: Pulmonary Disease

## 2016-05-09 ENCOUNTER — Ambulatory Visit (INDEPENDENT_AMBULATORY_CARE_PROVIDER_SITE_OTHER): Payer: BC Managed Care – PPO | Admitting: Pulmonary Disease

## 2016-05-09 DIAGNOSIS — I10 Essential (primary) hypertension: Secondary | ICD-10-CM | POA: Diagnosis not present

## 2016-05-09 DIAGNOSIS — G4733 Obstructive sleep apnea (adult) (pediatric): Secondary | ICD-10-CM | POA: Diagnosis not present

## 2016-05-09 DIAGNOSIS — J3089 Other allergic rhinitis: Secondary | ICD-10-CM | POA: Diagnosis not present

## 2016-05-09 NOTE — Progress Notes (Signed)
   Subjective:    Patient ID: Bobby Neer., male    DOB: September 24, 1966, 50 y.o.   MRN: [id number]  HPI    Review of Systems  Constitutional: Negative for activity change, appetite change, chills, fever and unexpected weight change.  HENT: Negative for congestion, dental problem, postnasal drip, rhinorrhea, sneezing, sore throat, trouble swallowing and voice change.   Eyes: Negative for visual disturbance.  Respiratory: Negative for cough, choking and shortness of breath.   Cardiovascular: Negative for chest pain and leg swelling.  Gastrointestinal: Negative for abdominal pain, nausea and vomiting.  Genitourinary: Negative for difficulty urinating.  Musculoskeletal: Negative for arthralgias.  Skin: Negative for rash.  Psychiatric/Behavioral: Negative for behavioral problems and confusion.       Objective:   Physical Exam        Assessment & Plan:

## 2016-05-09 NOTE — Assessment & Plan Note (Signed)
Trial of NASONEX - 1 spray each nare X 1 week

## 2016-05-09 NOTE — Patient Instructions (Signed)
CPAP download will be checked and we will adjust settings as required Increase humidity setting on CPAP  Trial of NASONEX - 1 spray each nare X 1 week  CPAP supplies will be renewed for 1 year- [facility]

## 2016-05-09 NOTE — Progress Notes (Signed)
Subjective:    Patient ID: Bobby Neer., male    DOB: 02/10/1966, 50 y.o.   MRN: [id number]  HPI  Chief Complaint  Patient presents with  . Sleep Consult    referred by Dr. Etter Sjogren; congested every morning when waking up, gets a lot of anxiety lately, stressed more, doesn't think he is getting enough sleep, air is not blowing in machine properly.  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 He had a good response with improvement in his daytime somnolence and tiredness. Snoring has subsided per his wife's report. He has been lost to follow-up for the last 4 years and now presents because he has increased congestion when he wakes up, he also feels increasingly tired throughout the day and feels that the machine is not being consistent. He wonders if he can get a new machine. Epworth sleepiness score is 19 and he reports sleepiness and radius social situations  Bedtime is between midnight and 1 AM, sleep latency is minimal, he sleeps on his back but answered to his side with one pillow, reports some dumb nocturnal awakenings and is out of bed by 6:30 AM regardless of what time he goes to bed feeling tired without dryness of mouth or headaches.  There is no history suggestive of cataplexy, sleep paralysis or parasomnias  He also reports increased throat congestion in the morning and feels this is due to his sinuses, he also has mild dyspnea while playing basketball-he wants to avoid medications that he has to take chronically  His weight is stable at 220 pounds He has been maintained on amlodipine and benazepril to control his blood pressure for many years    Significant tests/ events  HST 11/2011:  AHI 40/hr with desat to 77% Auto 2013:  Optimal pressure 16cm   Past Medical History:  Diagnosis Date  . Hyperlipidemia   . Hypertension      Past  Surgical History:  Procedure Laterality Date  . ADENOIDECTOMY  08/2011   [facility]--- gso ent  . CLOSED REDUCTION HAND FRACTURE  12/2010   Right hand  . KNEE SURGERY     bilat     No Known Allergies   Social History   Social History  . Marital status: Married    Spouse name: N/A  . Number of children: Y  . Years of education: N/A   Occupational History  . [employer]   .  [place] History Main Topics  . Smoking status: Never Smoker  . Smokeless tobacco: Never Used  . Alcohol use No  . Drug use: No  . Sexual activity: Yes    Partners: Male   Other Topics Concern  . Not on file   Social History Narrative   Regular Exercise- qd      Family History  Problem Relation Age of Onset  . Hypertension    . Hypertension Mother   . Cancer Mother 81    breast  . Hypertension Father   . Hyperlipidemia Father   . Stroke Father   . Cancer Father     prostate  . Colon cancer Father   . Hypertension Sister   . Cancer Maternal Aunt 38    breast  . Heart disease Maternal Uncle     mi  . Hypertension Maternal Uncle   . Hypertension  Maternal Grandmother   . Alzheimer's disease Maternal Grandmother   . Heart disease Maternal Grandfather     mi  . Hypertension Maternal Grandfather   . Hyperlipidemia Maternal Grandfather   . Stroke Maternal Grandfather   . Stroke Maternal Uncle   . Hypertension Maternal Uncle     Review of Systems neg for any significant sore throat, dysphagia, itching, sneezing, nasal congestion or excess/ purulent secretions, fever, chills, sweats, unintended wt loss, pleuritic or exertional cp, hempoptysis, orthopnea pnd or change in chronic leg swelling. Also denies presyncope, palpitations, heartburn, abdominal pain, nausea, vomiting, diarrhea or change in bowel or urinary habits, dysuria,hematuria, rash, arthralgias, visual complaints, headache, numbness weakness or ataxia.     Objective:   Physical Exam   Gen. Pleasant,  well-nourished, in no distress ENT - no lesions, no post nasal drip Neck: No JVD, no thyromegaly, no carotid bruits Lungs: no use of accessory muscles, no dullness to percussion, clear without rales or rhonchi  Cardiovascular: Rhythm regular, heart sounds  normal, no murmurs or gallops, no peripheral edema Musculoskeletal: No deformities, no cyanosis or clubbing         Assessment & Plan:

## 2016-05-09 NOTE — Assessment & Plan Note (Signed)
CPAP download will be checked and we will adjust settings as required Increase humidity setting on CPAP  CPAP supplies will be renewed for 1 year-   Weight loss encouraged, compliance with goal of at least 4-6 hrs every night is the expectation. Advised against medications with sedative side effects Cautioned against driving when sleepy - understanding that sleepiness will vary on a day to day basis

## 2016-05-09 NOTE — Assessment & Plan Note (Signed)
Slight high today, recheck with PCP

## 2016-05-11 ENCOUNTER — Telehealth: Payer: Self-pay | Admitting: Pulmonary Disease

## 2016-05-11 NOTE — Telephone Encounter (Signed)
Download obtained from pt's SD card and placed in RA's look-at for review. Pt advised we will call him once RA reviews. Nothing further needed.

## 2016-05-17 ENCOUNTER — Telehealth: Payer: Self-pay | Admitting: Pulmonary Disease

## 2016-05-17 NOTE — Telephone Encounter (Signed)
Per Dr. Elsworth Soho:  Compliance Report dated 05/11/16 shows no residuals on 16cm.  Several nights below 4 hours, needs to be consistently above 4 hours nightly.  ---------------- Patient notified of Dr. Bari Mantis recommendations.  Patient wants to know if the humidifier can be adjusted.  Dr. Elsworth Soho, please advise.

## 2016-05-17 NOTE — Telephone Encounter (Signed)
Called and spoke with pt and he stated that he was speaking with someone else from out office now.  Advised pt to cont the conversation with that other person.

## 2016-05-17 NOTE — Telephone Encounter (Signed)
LMOVM Will await call back.  

## 2016-05-17 NOTE — Telephone Encounter (Signed)
Patient returned call, CB is [phone]

## 2016-05-18 NOTE — Telephone Encounter (Signed)
Check with DME

## 2016-05-21 NOTE — Telephone Encounter (Signed)
Called and lm for [employer] to call back to discuss if the humidifier can be adjusted per pts request.

## 2016-05-21 NOTE — Telephone Encounter (Signed)
Spoke with Larkin Ina at [facility]. States that the pt's humidifier can be adjusted. They will contact the pt about how to adjust this. Nothing further was needed.

## 2016-05-23 ENCOUNTER — Encounter: Payer: Self-pay | Admitting: Pulmonary Disease

## 2016-07-02 ENCOUNTER — Encounter: Payer: Self-pay | Admitting: Family Medicine

## 2016-07-02 ENCOUNTER — Ambulatory Visit (INDEPENDENT_AMBULATORY_CARE_PROVIDER_SITE_OTHER): Payer: BC Managed Care – PPO | Admitting: Family Medicine

## 2016-07-02 VITALS — BP 145/91 | HR 59 | Temp 98.9°F | Resp 16 | Ht 73.0 in | Wt 223.0 lb

## 2016-07-02 DIAGNOSIS — I10 Essential (primary) hypertension: Secondary | ICD-10-CM | POA: Diagnosis not present

## 2016-07-02 DIAGNOSIS — T783XXA Angioneurotic edema, initial encounter: Secondary | ICD-10-CM | POA: Insufficient documentation

## 2016-07-02 MED ORDER — CARVEDILOL PHOSPHATE ER 10 MG PO CP24: 10.0000 mg | ORAL_CAPSULE | Freq: Every day | ORAL | 2 refills | Status: DC

## 2016-07-02 MED ORDER — PREDNISONE 10 MG PO TABS: ORAL_TABLET | ORAL | 0 refills | Status: DC

## 2016-07-02 MED ORDER — METHYLPREDNISOLONE ACETATE 80 MG/ML IJ SUSP
80.0000 mg | Freq: Once | INTRAMUSCULAR | Status: AC
  Administered 2016-07-02: 80 mg via INTRAMUSCULAR

## 2016-07-02 MED FILL — predniSONE 10 MG TABS: 10 | 9 days supply | Qty: 18 | Fill #0

## 2016-07-02 MED FILL — COREG CR 10 MG CAPSULE: 10 | 30 days supply | Qty: 30 | Fill #0 | Status: TO

## 2016-07-02 NOTE — Patient Instructions (Signed)
Angioedema  Angioedema is a sudden swelling of tissues, often of the skin. It can occur on the face or genitals or in the abdomen or other body parts. The swelling usually develops over a short period and gets better in 24 to 48 hours. It often begins during the night and is found when the person wakes up. The person may also get red, itchy patches of skin (hives). Angioedema can be dangerous if it involves swelling of the air passages.   Depending on the cause, episodes of angioedema may only happen once, come back in unpredictable patterns, or repeat for several years and then gradually fade away.   CAUSES   Angioedema can be caused by an allergic reaction to various triggers. It can also result from nonallergic causes, including reactions to drugs, immune system disorders, viral infections, or an abnormal gene that is passed to you from your parents (hereditary). For some people with angioedema, the cause is unknown.   Some things that can trigger angioedema include:    Foods.    Medicines, such as ACE inhibitors, ARBs, nonsteroidal anti-inflammatory agents, or estrogen.    Latex.    Animal saliva.    Insect stings.    Dyes used in X-rays.    Mild injury.    Dental work.   Surgery.   Stress.    Sudden changes in temperature.    Exercise.  SIGNS AND SYMPTOMS    Swelling of the skin.   Hives. If these are present, there is also intense itching.   Redness in the affected area.    Pain in the affected area.   Swollen lips or tongue.   Breathing problems. This may happen if the air passages swell.   Wheezing.  If internal organs are involved, there may be:    Nausea.    Abdominal pain.    Vomiting.    Difficulty swallowing.    Difficulty passing urine.  DIAGNOSIS    Your health care provider will examine the affected area and take a medical and family history.   Various tests may be done to help determine the cause. Tests may include:   Allergy skin tests to see if the problem  is an allergic reaction.    Blood tests to check for hereditary angioedema.    Tests to check for underlying diseases that could cause the condition.    A review of your medicines, including over-the-counter medicines, may be done.  TREATMENT   Treatment will depend on the cause of the angioedema. Possible treatments include:    Removal of anything that triggered the condition (such as stopping certain medicines).    Medicines to treat symptoms or prevent attacks. Medicines given may include:     Antihistamines.     Epinephrine injection.     Steroids.    Hospitalization may be required for severe attacks. If the air passages are affected, it can be an emergency. Tubes may need to be placed to keep the airway open.  HOME CARE INSTRUCTIONS    Take all medicines as directed by your health care provider.   If you were given medicines for emergency allergy treatment, always carry them with you.   Wear a medical bracelet as directed by your health care provider.    Avoid known triggers.  SEEK MEDICAL CARE IF:    You have repeat attacks of angioedema.    Your attacks are more frequent or more severe despite preventive measures.    You have hereditary angioedema   and are considering having children. It is important to discuss with your health care provider the risks of passing the condition on to your children.  SEEK IMMEDIATE MEDICAL CARE IF:    You have severe swelling of the mouth, tongue, or lips.   You have difficulty breathing.    You have difficulty swallowing.    You faint.  MAKE SURE YOU:   Understand these instructions.   Will watch your condition.   Will get help right away if you are not doing well or get worse.     This information is not intended to replace advice given to you by your health care provider. Make sure you discuss any questions you have with your health care provider.     Document Released: 12/03/2001 Document Revised: 10/15/2014 Document Reviewed:  05/18/2013  Elsevier Interactive Patient Education 2016 Elsevier Inc.

## 2016-07-02 NOTE — Assessment & Plan Note (Signed)
Stop lisinopril and change to coreg F/u 2-3 weeks

## 2016-07-02 NOTE — Assessment & Plan Note (Signed)
Depo medrol pred taper Benadryl  D/c lisinopril  rto prn

## 2016-07-02 NOTE — Progress Notes (Signed)
Pre visit review using our clinic review tool, if applicable. No additional management support is needed unless otherwise documented below in the visit note. 
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
## 2016-07-26 MED FILL — ATORVASTATIN 20 MG TABLET: 20 | 90 days supply | Qty: 90 | Fill #1

## 2016-07-27 MED FILL — AMLODIPINE BESYLATE 10 MG T: 10 | 90 days supply | Qty: 90 | Fill #1

## 2016-08-10 ENCOUNTER — Other Ambulatory Visit: Payer: Self-pay | Admitting: Emergency Medicine

## 2016-08-10 DIAGNOSIS — I10 Essential (primary) hypertension: Secondary | ICD-10-CM

## 2016-08-10 MED ORDER — CARVEDILOL PHOSPHATE ER 10 MG PO CP24: 10.0000 mg | ORAL_CAPSULE | Freq: Every day | ORAL | 1 refills | Status: DC

## 2016-09-10 ENCOUNTER — Encounter: Payer: Self-pay | Admitting: Pulmonary Disease

## 2016-09-25 ENCOUNTER — Other Ambulatory Visit: Payer: Self-pay | Admitting: Family Medicine

## 2016-09-25 DIAGNOSIS — I10 Essential (primary) hypertension: Secondary | ICD-10-CM

## 2016-11-01 ENCOUNTER — Other Ambulatory Visit: Payer: Self-pay | Admitting: Family Medicine

## 2016-11-01 MED FILL — AMLODIPINE BESYLATE 10 MG T: 10 | 90 days supply | Qty: 90 | Fill #0

## 2016-11-01 MED FILL — ATORVASTATIN 20 MG TABLET: 20 | 90 days supply | Qty: 90 | Fill #0

## 2016-11-16 ENCOUNTER — Other Ambulatory Visit: Payer: Self-pay | Admitting: Family Medicine

## 2016-11-16 DIAGNOSIS — I10 Essential (primary) hypertension: Secondary | ICD-10-CM

## 2016-11-19 ENCOUNTER — Telehealth: Payer: Self-pay | Admitting: Pulmonary Disease

## 2016-11-19 DIAGNOSIS — G4733 Obstructive sleep apnea (adult) (pediatric): Secondary | ICD-10-CM

## 2016-11-19 NOTE — Telephone Encounter (Signed)
lmomtcb x1 

## 2016-11-19 NOTE — Telephone Encounter (Signed)
2 week supply of carvedilol sent to pharmacy. Pt last seen 06/2016 and advised to follow up in 2 weeks for BP check.  Please  Call pt to schedule appt with Dr Carollee Herter ASAP. Thanks!

## 2016-11-20 NOTE — Telephone Encounter (Signed)
Spoke with pt. He is needing a order to go to [facility] for his CPAP supplies. Order has been placed. Nothing further was needed.

## 2016-11-20 NOTE — Telephone Encounter (Signed)
Patient calling back - he can be reached at [phone] -pr

## 2016-11-20 NOTE — Telephone Encounter (Signed)
Patient returned call - he can be reached at [phone] -pr

## 2016-11-20 NOTE — Telephone Encounter (Signed)
lmtcb for pt.  

## 2016-11-22 ENCOUNTER — Telehealth: Payer: Self-pay | Admitting: Pulmonary Disease

## 2016-11-22 NOTE — Telephone Encounter (Signed)
Left message on voicemail @ [phone] for patient to call the office to schedule follow up prior to further refills.

## 2016-11-22 NOTE — Telephone Encounter (Signed)
lmtcb x1 for pt to schedule an appointment.

## 2016-11-23 NOTE — Telephone Encounter (Signed)
[phone] pt calling back

## 2016-11-23 NOTE — Telephone Encounter (Signed)
Spoke with pt. He is aware that he will need an appointment. OV has been scheduled for 11/28/16. Nothing further was needed.

## 2016-11-23 NOTE — Telephone Encounter (Signed)
lmomtcb x 2  

## 2016-11-28 ENCOUNTER — Ambulatory Visit (INDEPENDENT_AMBULATORY_CARE_PROVIDER_SITE_OTHER): Payer: BC Managed Care – PPO | Admitting: Pulmonary Disease

## 2016-11-28 ENCOUNTER — Encounter: Payer: Self-pay | Admitting: Pulmonary Disease

## 2016-11-28 DIAGNOSIS — G4733 Obstructive sleep apnea (adult) (pediatric): Secondary | ICD-10-CM

## 2016-11-28 DIAGNOSIS — J302 Other seasonal allergic rhinitis: Secondary | ICD-10-CM | POA: Diagnosis not present

## 2016-11-28 NOTE — Addendum Note (Signed)
Addended by: Valerie Salts on: 11/28/2016 03:55 PM   Modules accepted: Orders

## 2016-11-28 NOTE — Assessment & Plan Note (Signed)
Trial of Nasonex 1 spray each nare at bedtime 

## 2016-11-28 NOTE — Assessment & Plan Note (Signed)
Changed to auto settings 10-16 cm, obtain download in 4 weeks This may make it easier for him to tolerate nasal pillows  Trial of nasal mask  Weight loss encouraged, compliance with goal of at least 4-6 hrs every night is the expectation. Advised against medications with sedative side effects Cautioned against driving when sleepy - understanding that sleepiness will vary on a day to day basis

## 2016-11-28 NOTE — Patient Instructions (Signed)
Changed to auto settings 10-16 cm, obtain download in 4 weeks  Trial of nasal mask  Trial of Nasonex 1 spray each nare at bedtime

## 2016-11-28 NOTE — Progress Notes (Signed)
   Subjective:    Patient ID: Bobby Neer., male    DOB: 07-30-1966, 51 y.o.   MRN: [id number]  HPI  51 year old [employer], [employer] who was diagnosed with OSA in 2013 and placed on auto CPAP and optimal pressure of 16 cm with nasal pillows.   He had good results with CPAP with improvement in his daytime somnolence and fatigue. He uses nasal pillows and finds that the pressure ramps up suddenly and wakes him up at night and he has to turn the machine off and start it again with the ramp function. He does report nasal congestion but denies dryness. Pressure is high sometimes He has adjusted to nasal pillows but never tried any other mask interface  Download from 05/2016 shows good control of events and 16 cm with average usage more than 4 hours per night  Significant tests/ events  HST 11/2011:  AHI 40/hr with desat to 77% Auto 2013:  Optimal pressure 16cm  Review of Systems Patient denies significant dyspnea,cough, hemoptysis,  chest pain, palpitations, pedal edema, orthopnea, paroxysmal nocturnal dyspnea, lightheadedness, nausea, vomiting, abdominal or  leg pains      Objective:   Physical Exam  Gen. Pleasant, obese, in no distress ENT - Congested turbinates, no post nasal drip Neck: No JVD, no thyromegaly, no carotid bruits Lungs: no use of accessory muscles, no dullness to percussion, decreased without rales or rhonchi  Cardiovascular: Rhythm regular, heart sounds  normal, no murmurs or gallops, no peripheral edema Musculoskeletal: No deformities, no cyanosis or clubbing , no tremors       Assessment & Plan:

## 2016-12-02 ENCOUNTER — Other Ambulatory Visit: Payer: Self-pay | Admitting: Family Medicine

## 2016-12-02 DIAGNOSIS — I10 Essential (primary) hypertension: Secondary | ICD-10-CM

## 2016-12-04 ENCOUNTER — Telehealth: Payer: Self-pay | Admitting: Pulmonary Disease

## 2016-12-04 NOTE — Telephone Encounter (Signed)
Patient stopped by office today to drop off a copy of his CPAP download. Since Dr. Elsworth Soho is not in the office this week, I will place download in his look-at folder for him to review when he returns.

## 2016-12-10 NOTE — Telephone Encounter (Signed)
CPAP 16 cm seems to control events very well and is effective He needs to be more consistent about using 4-6 hours every night

## 2016-12-10 NOTE — Telephone Encounter (Signed)
Left message for patient to call back  

## 2016-12-11 NOTE — Telephone Encounter (Signed)
Patient is returning phone call.  °

## 2016-12-11 NOTE — Telephone Encounter (Signed)
Left another message for patient.

## 2016-12-11 NOTE — Telephone Encounter (Signed)
Spoke with patient regarding download feedback. Pt verbalized understanding. Nothing further needed.

## 2016-12-21 ENCOUNTER — Encounter: Payer: Self-pay | Admitting: Family Medicine

## 2016-12-21 ENCOUNTER — Ambulatory Visit (INDEPENDENT_AMBULATORY_CARE_PROVIDER_SITE_OTHER): Payer: BC Managed Care – PPO | Admitting: Family Medicine

## 2016-12-21 VITALS — BP 130/88 | HR 60 | Temp 98.5°F | Resp 16 | Ht 73.0 in | Wt 236.6 lb

## 2016-12-21 DIAGNOSIS — I10 Essential (primary) hypertension: Secondary | ICD-10-CM | POA: Diagnosis not present

## 2016-12-21 DIAGNOSIS — Z0000 Encounter for general adult medical examination without abnormal findings: Secondary | ICD-10-CM | POA: Diagnosis not present

## 2016-12-21 DIAGNOSIS — E785 Hyperlipidemia, unspecified: Secondary | ICD-10-CM | POA: Diagnosis not present

## 2016-12-21 DIAGNOSIS — E049 Nontoxic goiter, unspecified: Secondary | ICD-10-CM

## 2016-12-21 DIAGNOSIS — Z23 Encounter for immunization: Secondary | ICD-10-CM | POA: Diagnosis not present

## 2016-12-21 LAB — CBC WITH DIFFERENTIAL/PLATELET
BASOS ABS: 41 {cells}/uL (ref 0–200)
Basophils Relative: 1 %
EOS ABS: 123 {cells}/uL (ref 15–500)
Eosinophils Relative: 3 %
HEMATOCRIT: 38.8 % (ref 38.5–50.0)
HEMOGLOBIN: 13.2 g/dL (ref 13.2–17.1)
LYMPHS ABS: 1968 {cells}/uL (ref 850–3900)
Lymphocytes Relative: 48 %
MCH: 29.5 pg (ref 27.0–33.0)
MCHC: 34 g/dL (ref 32.0–36.0)
MCV: 86.6 fL (ref 80.0–100.0)
MONO ABS: 369 {cells}/uL (ref 200–950)
MPV: 9.1 fL (ref 7.5–12.5)
Monocytes Relative: 9 %
NEUTROS PCT: 39 %
Neutro Abs: 1599 cells/uL (ref 1500–7800)
Platelets: 262 10*3/uL (ref 140–400)
RBC: 4.48 MIL/uL (ref 4.20–5.80)
RDW: 14 % (ref 11.0–15.0)
WBC: 4.1 10*3/uL (ref 3.8–10.8)

## 2016-12-21 LAB — COMPREHENSIVE METABOLIC PANEL
ALBUMIN: 4.3 g/dL (ref 3.6–5.1)
ALK PHOS: 80 U/L (ref 40–115)
ALT: 25 U/L (ref 9–46)
AST: 25 U/L (ref 10–35)
BILIRUBIN TOTAL: 0.6 mg/dL (ref 0.2–1.2)
BUN: 15 mg/dL (ref 7–25)
CALCIUM: 9.4 mg/dL (ref 8.6–10.3)
CO2: 28 mmol/L (ref 20–31)
Chloride: 103 mmol/L (ref 98–110)
Creat: 1.05 mg/dL (ref 0.70–1.33)
Glucose, Bld: 88 mg/dL (ref 65–99)
POTASSIUM: 3.5 mmol/L (ref 3.5–5.3)
Sodium: 140 mmol/L (ref 135–146)
Total Protein: 8 g/dL (ref 6.1–8.1)

## 2016-12-21 LAB — LIPID PANEL
CHOLESTEROL: 147 mg/dL (ref <200)
HDL: 52 mg/dL (ref >40)
LDL Cholesterol: 79 mg/dL (ref <100)
TRIGLYCERIDES: 78 mg/dL (ref <150)
Total CHOL/HDL Ratio: 2.8 Ratio (ref <5.0)
VLDL: 16 mg/dL (ref <30)

## 2016-12-21 MED ORDER — CARVEDILOL PHOSPHATE ER 20 MG PO CP24: 20.0000 mg | ORAL_CAPSULE | Freq: Every day | ORAL | 1 refills | Status: DC

## 2016-12-21 MED ORDER — CARVEDILOL PHOSPHATE ER 10 MG PO CP24: ORAL_CAPSULE | ORAL | 1 refills | Status: DC

## 2016-12-21 NOTE — Progress Notes (Signed)
Pre visit review using our clinic review tool, if applicable. No additional management support is needed unless otherwise documented below in the visit note. 

## 2016-12-21 NOTE — Progress Notes (Signed)
Patient ID: Bobby Neer., male   DOB: Mar 09, 1966, 51 y.o.   MRN: [id number]     Subjective:  I acted as a Education administrator for Dr. Carollee Herter.  Guerry Bruin, [place]   Patient ID: Bobby Neer., male    DOB: 25-Oct-1965, 51 y.o.   MRN: [id number]  Chief Complaint  Patient presents with  . Annual Exam    HPI  Patient is in today for annual exam.  He has no other complaints.  Patient Care Team: Ann Held, DO as PCP - [place], MD as Consulting Physician (Nephrology)   Past Medical History:  Diagnosis Date  . Hyperlipidemia   . Hypertension     Past Surgical History:  Procedure Laterality Date  . ADENOIDECTOMY  08/2011   Gore--- gso ent  . CLOSED REDUCTION HAND FRACTURE  12/2010   Right hand  . KNEE SURGERY     bilat    Family History  Problem Relation Age of Onset  . Hypertension Mother   . Cancer Mother 38    breast  . Hypertension Father   . Hyperlipidemia Father   . Stroke Father   . Cancer Father     prostate  . Colon cancer Father   . Hypertension Sister   . Cancer Maternal Aunt 38    breast  . Heart disease Maternal Uncle     mi  . Hypertension Maternal Uncle   . Hypertension Maternal Grandmother   . Alzheimer's disease Maternal Grandmother   . Heart disease Maternal Grandfather     mi  . Hypertension Maternal Grandfather   . Hyperlipidemia Maternal Grandfather   . Stroke Maternal Grandfather   . Stroke Maternal Uncle   . Hypertension Maternal Uncle   . Hypertension      Social History   Social History  . Marital status: Married    Spouse name: N/A  . Number of children: Y  . Years of education: N/A   Occupational History  . [employer]   .  [employer]   . Control and instrumentation engineer    Social History Main Topics  . Smoking status: Never Smoker  . Smokeless tobacco: Never Used  . Alcohol use No  . Drug use: No  . Sexual activity: Yes    Partners: Male   Other Topics Concern  . Not on file   Social  History Narrative   Regular Exercise- qd     Outpatient Medications Prior to Visit  Medication Sig Dispense Refill  . amLODipine (NORVASC) 10 MG tablet TAKE 1 TABLET (10 MG TOTAL) BY MOUTH DAILY. 90 tablet 1  . atorvastatin (LIPITOR) 20 MG tablet TAKE 1 TABLET (20 MG TOTAL) BY MOUTH AT BEDTIME. 90 tablet 1  . carvedilol (COREG CR) 10 MG 24 hr capsule TAKE 1 TABLET BY MOUTH EVERY DAY 30 capsule 0   No facility-administered medications prior to visit.     Allergies  Allergen Reactions  . Ace Inhibitors Other (See Comments)    angioedema    Review of Systems  Constitutional: Negative for fever and malaise/fatigue.  HENT: Negative for congestion.   Eyes: Negative for blurred vision.  Respiratory: Negative for cough and shortness of breath.   Cardiovascular: Negative for chest pain, palpitations and leg swelling.  Gastrointestinal: Negative for vomiting.  Musculoskeletal: Negative for back pain.  Skin: Negative for rash.  Neurological: Negative for loss of consciousness and headaches.       Objective:    Physical Exam  Constitutional: He  appears well-developed and well-nourished. No distress.  HENT:  Head: Normocephalic and atraumatic.  Eyes: Conjunctivae are normal.  Neck: Normal range of motion. No thyromegaly present.  Cardiovascular: Normal rate and regular rhythm.   Pulmonary/Chest: Effort normal. He has no wheezes.  Abdominal: Soft. Bowel sounds are normal. There is no tenderness.  Musculoskeletal: Normal range of motion. He exhibits no edema or deformity.  Neurological: He is alert.  Right side facial droop  Skin: Skin is warm and dry. He is not diaphoretic.  Psychiatric: He has a normal mood and affect.    BP 130/88   Pulse 60   Temp 98.5 F (36.9 C) (Oral)   Resp 16   Ht 6\' 1"  (1.854 m)   Wt 236 lb 9.6 oz (107.3 kg)   SpO2 97%   BMI 31.22 kg/m  Wt Readings from Last 3 Encounters:  12/21/16 236 lb 9.6 oz (107.3 kg)  11/28/16 232 lb (105.2 kg)    07/02/16 223 lb (101.2 kg)   BP Readings from Last 3 Encounters:  12/21/16 130/88  11/28/16 124/82  07/02/16 (!) 145/91     Immunization History  Administered Date(s) Administered  . Influenza Split 10/22/2011  . Influenza Whole 07/05/2008  . Influenza,inj,Quad PF,36+ Mos 07/19/2015, 12/21/2016  . Td 10/27/2004  . Tdap 07/19/2015    Health Maintenance  Topic Date Due  . COLONOSCOPY  03/18/2017  . TETANUS/TDAP  07/18/2025  . INFLUENZA VACCINE  Completed  . HIV Screening  Completed    Lab Results  Component Value Date   WBC 4.1 12/21/2016   HGB 13.2 12/21/2016   HCT 38.8 12/21/2016   PLT 262 12/21/2016   GLUCOSE 88 12/21/2016   CHOL 147 12/21/2016   TRIG 78 12/21/2016   HDL 52 12/21/2016   LDLCALC 79 12/21/2016   ALT 25 12/21/2016   AST 25 12/21/2016   NA 140 12/21/2016   K 3.5 12/21/2016   CL 103 12/21/2016   CREATININE 1.05 12/21/2016   BUN 15 12/21/2016   CO2 28 12/21/2016   TSH 1.83 12/21/2016   PSA 2.26 07/19/2015   MICROALBUR 2.2 (H) 02/17/2013    Lab Results  Component Value Date   TSH 1.83 12/21/2016   Lab Results  Component Value Date   WBC 4.1 12/21/2016   HGB 13.2 12/21/2016   HCT 38.8 12/21/2016   MCV 86.6 12/21/2016   PLT 262 12/21/2016   Lab Results  Component Value Date   NA 140 12/21/2016   K 3.5 12/21/2016   CO2 28 12/21/2016   GLUCOSE 88 12/21/2016   BUN 15 12/21/2016   CREATININE 1.05 12/21/2016   BILITOT 0.6 12/21/2016   ALKPHOS 80 12/21/2016   AST 25 12/21/2016   ALT 25 12/21/2016   PROT 8.0 12/21/2016   ALBUMIN 4.3 12/21/2016   CALCIUM 9.4 12/21/2016   ANIONGAP 13 09/04/2014   GFR 74.79 02/09/2016   Lab Results  Component Value Date   CHOL 147 12/21/2016   Lab Results  Component Value Date   HDL 52 12/21/2016   Lab Results  Component Value Date   LDLCALC 79 12/21/2016   Lab Results  Component Value Date   TRIG 78 12/21/2016   Lab Results  Component Value Date   CHOLHDL 2.8 12/21/2016   No results  found for: HGBA1C       Assessment & Plan:   Problem List Items Addressed This Visit      Unprioritized   Enlarged thyroid   Relevant Medications  carvedilol (COREG CR) 20 MG 24 hr capsule   Other Relevant Orders   TSH (Completed)   Hyperlipidemia   Relevant Medications   carvedilol (COREG CR) 20 MG 24 hr capsule   amLODipine (NORVASC) 10 MG tablet   atorvastatin (LIPITOR) 20 MG tablet   Other Relevant Orders   Lipid panel (Completed)   Essential hypertension   Relevant Medications   carvedilol (COREG CR) 20 MG 24 hr capsule   amLODipine (NORVASC) 10 MG tablet   atorvastatin (LIPITOR) 20 MG tablet   Other Relevant Orders   Comprehensive metabolic panel (Completed)   CBC with Differential/Platelet (Completed)   Preventative health care - Primary    ghm utd Check labs See AVS Flu shot given       Other Visit Diagnoses    Influenza vaccine administered       Relevant Orders   Flu Vaccine QUAD 36+ mos IM (Fluarix & Fluzone Quad PF (Completed)      I am having Mr. Samford maintain his carvedilol, amLODipine, and atorvastatin.  Meds ordered this encounter  Medications  . DISCONTD: carvedilol (COREG CR) 10 MG 24 hr capsule    Sig: TAKE 1 TABLET BY MOUTH EVERY DAY    Dispense:  90 capsule    Refill:  1  . DISCONTD: carvedilol (COREG CR) 20 MG 24 hr capsule    Sig: Take 1 capsule (20 mg total) by mouth daily.    Dispense:  90 capsule    Refill:  1  . carvedilol (COREG CR) 20 MG 24 hr capsule    Sig: Take 1 capsule (20 mg total) by mouth daily.    Dispense:  90 capsule    Refill:  1  . amLODipine (NORVASC) 10 MG tablet    Sig: TAKE 1 TABLET (10 MG TOTAL) BY MOUTH DAILY.    Dispense:  90 tablet    Refill:  1  . atorvastatin (LIPITOR) 20 MG tablet    Sig: TAKE 1 TABLET (20 MG TOTAL) BY MOUTH AT BEDTIME.    Dispense:  90 tablet    Refill:  1    CMA served as Education administrator during this visit. History, Physical and Plan performed by medical provider. Documentation and  orders reviewed and attested to.  Ann Held, DO

## 2016-12-21 NOTE — Patient Instructions (Signed)
Do not forget to call about the shingles vaccine.  We should have it in April.   Preventive Care 40-64 Years, Male Preventive care refers to lifestyle choices and visits with your health care provider that can promote health and wellness. What does preventive care include?  A yearly physical exam. This is also called an annual well check.  Dental exams once or twice a year.  Routine eye exams. Ask your health care provider how often you should have your eyes checked.  Personal lifestyle choices, including:  Daily care of your teeth and gums.  Regular physical activity.  Eating a healthy diet.  Avoiding tobacco and drug use.  Limiting alcohol use.  Practicing safe sex.  Taking low-dose aspirin every day starting at age 52. What happens during an annual well check? The services and screenings done by your health care provider during your annual well check will depend on your age, overall health, lifestyle risk factors, and family history of disease. Counseling  Your health care provider may ask you questions about your:  Alcohol use.  Tobacco use.  Drug use.  Emotional well-being.  Home and relationship well-being.  Sexual activity.  Eating habits.  Work and work environment. Screening  You may have the following tests or measurements:  Height, weight, and BMI.  Blood pressure.  Lipid and cholesterol levels. These may be checked every 5 years, or more frequently if you are over 50 years old.  Skin check.  Lung cancer screening. You may have this screening every year starting at age 55 if you have a 30-pack-year history of smoking and currently smoke or have quit within the past 15 years.  Fecal occult blood test (FOBT) of the stool. You may have this test every year starting at age 52.  Flexible sigmoidoscopy or colonoscopy. You may have a sigmoidoscopy every 5 years or a colonoscopy every 10 years starting at age 52.  Prostate cancer screening.  Recommendations will vary depending on your family history and other risks.  Hepatitis C blood test.  Hepatitis B blood test.  Sexually transmitted disease (STD) testing.  Diabetes screening. This is done by checking your blood sugar (glucose) after you have not eaten for a while (fasting). You may have this done every 1-3 years. Discuss your test results, treatment options, and if necessary, the need for more tests with your health care provider. Vaccines  Your health care provider may recommend certain vaccines, such as:  Influenza vaccine. This is recommended every year.  Tetanus, diphtheria, and acellular pertussis (Tdap, Td) vaccine. You may need a Td booster every 10 years.  Varicella vaccine. You may need this if you have not been vaccinated.  Zoster vaccine. You may need this after age 60.  Measles, mumps, and rubella (MMR) vaccine. You may need at least one dose of MMR if you were born in 1957 or later. You may also need a second dose.  Pneumococcal 13-valent conjugate (PCV13) vaccine. You may need this if you have certain conditions and have not been vaccinated.  Pneumococcal polysaccharide (PPSV23) vaccine. You may need one or two doses if you smoke cigarettes or if you have certain conditions.  Meningococcal vaccine. You may need this if you have certain conditions.  Hepatitis A vaccine. You may need this if you have certain conditions or if you travel or work in places where you may be exposed to hepatitis A.  Hepatitis B vaccine. You may need this if you have certain conditions or if you travel   or work in places where you may be exposed to hepatitis B.  Haemophilus influenzae type b (Hib) vaccine. You may need this if you have certain risk factors. Talk to your health care provider about which screenings and vaccines you need and how often you need them. This information is not intended to replace advice given to you by your health care provider. Make sure you discuss  any questions you have with your health care provider. Document Released: 10/21/2015 Document Revised: 06/13/2016 Document Reviewed: 07/26/2015 Elsevier Interactive Patient Education  2017 [place].    Influenza (Flu) Vaccine (Inactivated or Recombinant): What You Need to Know 1. Why get vaccinated? Influenza ("flu") is a contagious disease that spreads around the [place] every year, usually between October and May. Flu is caused by influenza viruses, and is spread mainly by coughing, sneezing, and close contact. Anyone can get flu. Flu strikes suddenly and can last several days. Symptoms vary by age, but can include:  fever/chills  sore throat  muscle aches  fatigue  cough  headache  runny or stuffy nose Flu can also lead to pneumonia and blood infections, and cause diarrhea and seizures in children. If you have a medical condition, such as heart or lung disease, flu can make it worse. Flu is more dangerous for some people. Infants and young children, people 52 years of age and older, pregnant women, and people with certain health conditions or a weakened immune system are at greatest risk. Each year thousands of people in the [place] die from flu, and many more are hospitalized. Flu vaccine can:   keep you from getting flu,  make flu less severe if you do get it, and  keep you from spreading flu to your family and other people. 2. Inactivated and recombinant flu vaccines A dose of flu vaccine is recommended every flu season. Children 6 months through 5 years of age may need two doses during the same flu season. Everyone else needs only one dose each flu season. Some inactivated flu vaccines contain a very small amount of a mercury-based preservative called thimerosal. Studies have not shown thimerosal in vaccines to be harmful, but flu vaccines that do not contain thimerosal are available. There is no live flu virus in flu shots. They cannot cause the  flu. There are many flu viruses, and they are always changing. Each year a new flu vaccine is made to protect against three or four viruses that are likely to cause disease in the upcoming flu season. But even when the vaccine doesn't exactly match these viruses, it may still provide some protection. Flu vaccine cannot prevent:  flu that is caused by a virus not covered by the vaccine, or  illnesses that look like flu but are not. It takes about 2 weeks for protection to develop after vaccination, and protection lasts through the flu season. 3. Some people should not get this vaccine Tell the person who is giving you the vaccine:  If you have any severe, life-threatening allergies. If you ever had a life-threatening allergic reaction after a dose of flu vaccine, or have a severe allergy to any part of this vaccine, you may be advised not to get vaccinated. Most, but not all, types of flu vaccine contain a small amount of egg protein.  If you ever had Guillain-Barr Syndrome (also called GBS). Some people with a history of GBS should not get this vaccine. This should be discussed with your doctor.  If you are not  feeling well. It is usually okay to get flu vaccine when you have a mild illness, but you might be asked to come back when you feel better. 4. Risks of a vaccine reaction With any medicine, including vaccines, there is a chance of reactions. These are usually mild and go away on their own, but serious reactions are also possible. Most people who get a flu shot do not have any problems with it. Minor problems following a flu shot include:  soreness, redness, or swelling where the shot was given  hoarseness  sore, red or itchy eyes  cough  fever  aches  headache  itching  fatigue If these problems occur, they usually begin soon after the shot and last 1 or 2 days. More serious problems following a flu shot can include the following:  There may be a small increased risk  of Guillain-Barre Syndrome (GBS) after inactivated flu vaccine. This risk has been estimated at 1 or 2 additional cases per million people vaccinated. This is much lower than the risk of severe complications from flu, which can be prevented by flu vaccine.  Young children who get the flu shot along with pneumococcal vaccine (PCV13) and/or DTaP vaccine at the same time might be slightly more likely to have a seizure caused by fever. Ask your doctor for more information. Tell your doctor if a child who is getting flu vaccine has ever had a seizure. Problems that could happen after any injected vaccine:   People sometimes faint after a medical procedure, including vaccination. Sitting or lying down for about 15 minutes can help prevent fainting, and injuries caused by a fall. Tell your doctor if you feel dizzy, or have vision changes or ringing in the ears.  Some people get severe pain in the shoulder and have difficulty moving the arm where a shot was given. This happens very rarely.  Any medication can cause a severe allergic reaction. Such reactions from a vaccine are very rare, estimated at about 1 in a million doses, and would happen within a few minutes to a few hours after the vaccination. As with any medicine, there is a very remote chance of a vaccine causing a serious injury or death. The safety of vaccines is always being monitored. For more information, visit: www.cdc.gov/vaccinesafety/ 5. What if there is a serious reaction? What should I look for?  Look for anything that concerns you, such as signs of a severe allergic reaction, very high fever, or unusual behavior. Signs of a severe allergic reaction can include hives, swelling of the face and throat, difficulty breathing, a fast heartbeat, dizziness, and weakness. These would start a few minutes to a few hours after the vaccination. What should I do?   If you think it is a severe allergic reaction or other emergency that can't wait,  call 9-1-1 and get the person to the nearest hospital. Otherwise, call your doctor.  Reactions should be reported to the Vaccine Adverse Event Reporting System (VAERS). Your doctor should file this report, or you can do it yourself through the VAERS web site at www.vaers.hhs.gov, or by calling 1-800-822-7967.  VAERS does not give medical advice. 6. The National Vaccine Injury Compensation Program The National Vaccine Injury Compensation Program (VICP) is a federal program that was created to compensate people who may have been injured by certain vaccines. Persons who believe they may have been injured by a vaccine can learn about the program and about filing a claim by calling 1-800-338-2382 or visiting   the VICP website at www.hrsa.gov/vaccinecompensation. There is a time limit to file a claim for compensation. 7. How can I learn more?  Ask your healthcare provider. He or she can give you the vaccine package insert or suggest other sources of information.  Call your local or state health department.  Contact the Centers for Disease Control and Prevention (CDC):  Call 1-800-232-4636 (1-800-CDC-INFO) or  Visit CDC's website at www.cdc.gov/flu Vaccine Information Statement, Inactivated Influenza Vaccine (05/14/2014) This information is not intended to replace advice given to you by your health care provider. Make sure you discuss any questions you have with your health care provider. Document Released: 07/19/2006 Document Revised: 06/14/2016 Document Reviewed: 06/14/2016 Elsevier Interactive Patient Education  2017 [employer] Inc[employer]  

## 2016-12-22 LAB — TSH: TSH: 1.83 mIU/L (ref 0.40–4.50)

## 2016-12-23 DIAGNOSIS — Z0000 Encounter for general adult medical examination without abnormal findings: Secondary | ICD-10-CM | POA: Insufficient documentation

## 2016-12-23 MED ORDER — ATORVASTATIN CALCIUM 20 MG PO TABS: ORAL_TABLET | ORAL | 1 refills | Status: DC

## 2016-12-23 MED ORDER — AMLODIPINE BESYLATE 10 MG PO TABS: ORAL_TABLET | ORAL | 1 refills | Status: DC

## 2016-12-23 NOTE — Assessment & Plan Note (Signed)
ghm utd Check labs See AVS Flu shot given

## 2017-01-03 ENCOUNTER — Other Ambulatory Visit: Payer: Self-pay | Admitting: Family Medicine

## 2017-01-03 DIAGNOSIS — I10 Essential (primary) hypertension: Secondary | ICD-10-CM

## 2017-01-07 ENCOUNTER — Encounter: Payer: Self-pay | Admitting: Internal Medicine

## 2017-01-21 ENCOUNTER — Encounter: Payer: Self-pay | Admitting: Internal Medicine

## 2017-02-05 MED FILL — ATORVASTATIN 20 MG TABLET: 20 | 90 days supply | Qty: 90 | Fill #1

## 2017-02-05 MED FILL — AMLODIPINE BESYLATE 10 MG T: 10 | 90 days supply | Qty: 90 | Fill #1

## 2017-03-14 ENCOUNTER — Ambulatory Visit: Payer: BC Managed Care – PPO | Admitting: *Deleted

## 2017-03-14 VITALS — Ht 73.0 in | Wt 234.2 lb

## 2017-03-14 DIAGNOSIS — Z86010 Personal history of colonic polyps: Secondary | ICD-10-CM

## 2017-03-14 MED ORDER — SUPREP BOWEL PREP KIT 17.5-3.13-1.6 GM/177ML PO SOLN: 1.0000 | Freq: Once | ORAL | 0 refills | Status: AC

## 2017-03-14 NOTE — Progress Notes (Signed)
Patient denies any allergies to egg or soy products. Patient denies complications with anesthesia/sedation.  Patient denies oxygen use at home and denies diet medications. t.

## 2017-03-15 ENCOUNTER — Encounter: Payer: Self-pay | Admitting: Internal Medicine

## 2017-03-28 ENCOUNTER — Ambulatory Visit (AMBULATORY_SURGERY_CENTER): Payer: BC Managed Care – PPO | Admitting: Internal Medicine

## 2017-03-28 ENCOUNTER — Encounter: Payer: Self-pay | Admitting: Internal Medicine

## 2017-03-28 VITALS — BP 139/83 | HR 52 | Temp 98.2°F | Resp 13 | Ht 73.0 in | Wt 234.0 lb

## 2017-03-28 DIAGNOSIS — Z86010 Personal history of colonic polyps: Secondary | ICD-10-CM

## 2017-03-28 DIAGNOSIS — K6389 Other specified diseases of intestine: Secondary | ICD-10-CM

## 2017-03-28 DIAGNOSIS — D123 Benign neoplasm of transverse colon: Secondary | ICD-10-CM

## 2017-03-28 MED ORDER — SODIUM CHLORIDE 0.9 % IV SOLN: 500.0000 mL | INTRAVENOUS | Status: AC

## 2017-03-28 NOTE — Op Note (Signed)
[facility] Patient Name: Bobby Castaneda Procedure Date: 03/28/2017 10:33 AM MRN: [id number] Endoscopist: Docia Chuck. Henrene Pastor , MD Age: 51 Referring MD:  Date of Birth: 1966-02-03 Gender: Male Account #: [id number] Procedure:                Colonoscopy, with cold snare polypectomy X 1 Indications:              High risk colon cancer surveillance: Personal                            history of non-advanced adenoma, High risk colon                            cancer surveillance: Personal history of sessile                            serrated colon polyp (less than 10 mm in size) with                            no dysplasia. Previous examination June 2015 with 2                            tubular adenomas and one SSP. Father with colon                            cancer around age 43 Medicines:                Monitored Anesthesia Care Procedure:                Pre-Anesthesia Assessment:                           - Prior to the procedure, a History and Physical                            was performed, and patient medications and                            allergies were reviewed. The patient's tolerance of                            previous anesthesia was also reviewed. The risks                            and benefits of the procedure and the sedation                            options and risks were discussed with the patient.                            All questions were answered, and informed consent                            was obtained. Prior Anticoagulants: The patient has  taken no previous anticoagulant or antiplatelet                            agents. ASA Grade Assessment: II - A patient with                            mild systemic disease. After reviewing the risks                            and benefits, the patient was deemed in                            satisfactory condition to undergo the procedure.                           After  obtaining informed consent, the colonoscope                            was passed under direct vision. Throughout the                            procedure, the patient's blood pressure, pulse, and                            oxygen saturations were monitored continuously. The                            Model PCF-H190DL [id number]) scope was introduced                            through the anus and advanced to the the cecum,                            identified by appendiceal orifice and ileocecal                            valve. The ileocecal valve, appendiceal orifice,                            and rectum were photographed. The quality of the                            bowel preparation was poor. The colonoscopy was                            performed without difficulty. The patient tolerated                            the procedure well. The bowel preparation used was                            SUPREP. Scope In: 10:44:10 AM Scope Out: 10:59:07 AM Scope Withdrawal Time: 0 hours 4 minutes 58 seconds  Total Procedure Duration: 0 hours  14 minutes 57 seconds  Findings:                 A 3 mm polyp was found in the distal transverse                            colon. The polyp was removed with a cold snare.                            Resection and retrieval were complete.                           The exam was otherwise without abnormality on                            direct and retroflexion views. Complications:            No immediate complications. Estimated blood loss:                            None. Estimated Blood Loss:     Estimated blood loss: none. Impression:               - Preparation of the colon was poor.                           - One 3 mm polyp in the distal transverse colon,                            removed with a cold snare. Resected and retrieved.                           - The examination was otherwise normal on direct                            and retroflexion  views. Recommendation:           - Repeat colonoscopy in 1 year for surveillance.                            Will need more vigorous preparation with 2 days of                            clears, magnesium citrate and Su prep day prior.                           - Patient has a contact number available for                            emergencies. The signs and symptoms of potential                            delayed complications were discussed with the                            patient. Return to  normal activities tomorrow.                            Written discharge instructions were provided to the                            patient.                           - Resume previous diet.                           - Continue present medications.                           - Await pathology results. Docia Chuck. Henrene Pastor, MD 03/28/2017 11:08:09 AM This report has been signed electronically.

## 2017-03-28 NOTE — Progress Notes (Signed)
Report to PACU, RN, vss, BBS= Clear.  

## 2017-03-28 NOTE — Patient Instructions (Signed)
YOU HAD AN ENDOSCOPIC PROCEDURE TODAY AT THE [facility]:   Refer to the procedure report that was given to you for any specific questions about what was found during the examination.  If the procedure report does not answer your questions, please call your gastroenterologist to clarify.  If you requested that your care partner not be given the details of your procedure findings, then the procedure report has been included in a sealed envelope for you to review at your convenience later.  YOU SHOULD EXPECT: Some feelings of bloating in the abdomen. Passage of more gas than usual.  Walking can help get rid of the air that was put into your GI tract during the procedure and reduce the bloating. If you had a lower endoscopy (such as a colonoscopy or flexible sigmoidoscopy) you may notice spotting of blood in your stool or on the toilet paper. If you underwent a bowel prep for your procedure, you may not have a normal bowel movement for a few days.  Please Note:  You might notice some irritation and congestion in your nose or some drainage.  This is from the oxygen used during your procedure.  There is no need for concern and it should clear up in a day or so.  SYMPTOMS TO REPORT IMMEDIATELY:   Following lower endoscopy (colonoscopy or flexible sigmoidoscopy):  Excessive amounts of blood in the stool  Significant tenderness or worsening of abdominal pains  Swelling of the abdomen that is new, acute  Fever of 100F or higher    For urgent or emergent issues, a gastroenterologist can be reached at any hour by calling [phone].   DIET:  We do recommend a small meal at first, but then you may proceed to your regular diet.  Drink plenty of fluids but you should avoid alcoholic beverages for 24 hours.  ACTIVITY:  You should plan to take it easy for the rest of today and you should NOT DRIVE or use heavy machinery until tomorrow (because of the sedation medicines used during the test).     FOLLOW UP: Our staff will call the number listed on your records the next business day following your procedure to check on you and address any questions or concerns that you may have regarding the information given to you following your procedure. If we do not reach you, we will leave a message.  However, if you are feeling well and you are not experiencing any problems, there is no need to return our call.  We will assume that you have returned to your regular daily activities without incident.  If any biopsies were taken you will be contacted by phone or by letter within the next 1-3 weeks.  Please call us at [phone] if you have not heard about the biopsies in 3 weeks.    SIGNATURES/CONFIDENTIALITY: You and/or your care partner have signed paperwork which will be entered into your electronic medical record.  These signatures attest to the fact that that the information above on your After Visit Summary has been reviewed and is understood.  Full responsibility of the confidentiality of this discharge information lies with you and/or your care-partner.   Resume medications. Information given on polyps. 

## 2017-03-28 NOTE — Progress Notes (Signed)
Pt's states no medical or surgical changes since previsit or office visit. 

## 2017-03-28 NOTE — Progress Notes (Signed)
Called to room to assist during endoscopic procedure.  Patient ID and intended procedure confirmed with present staff. Received instructions for my participation in the procedure from the performing physician.  

## 2017-03-29 ENCOUNTER — Telehealth: Payer: Self-pay | Admitting: *Deleted

## 2017-03-29 NOTE — Telephone Encounter (Signed)
  Follow up Call-  Call back number 03/28/2017  Post procedure Call Back phone  # [phone]  Permission to leave phone message Yes  Some recent data might be hidden     Patient questions:  Do you have a fever, pain , or abdominal swelling? No. Pain Score  0 *  Have you tolerated food without any problems? Yes.    Have you been able to return to your normal activities? Yes.    Do you have any questions about your discharge instructions: Diet   No. Medications  No. Follow up visit  No.  Do you have questions or concerns about your Care? No.  Actions: * If pain score is 4 or above: No action needed, pain <4.

## 2017-04-03 ENCOUNTER — Encounter: Payer: Self-pay | Admitting: Internal Medicine

## 2017-04-04 IMAGING — CR DG FEMUR 2+V*L*
4 series · 4 of 4 positions shown · non-contrast
Comparison: None.

CLINICAL DATA: Pain throughout the left femur following playing
basketball 1 week ago

EXAM:
LEFT FEMUR 2 VIEWS

[t femur with hip  ap left]
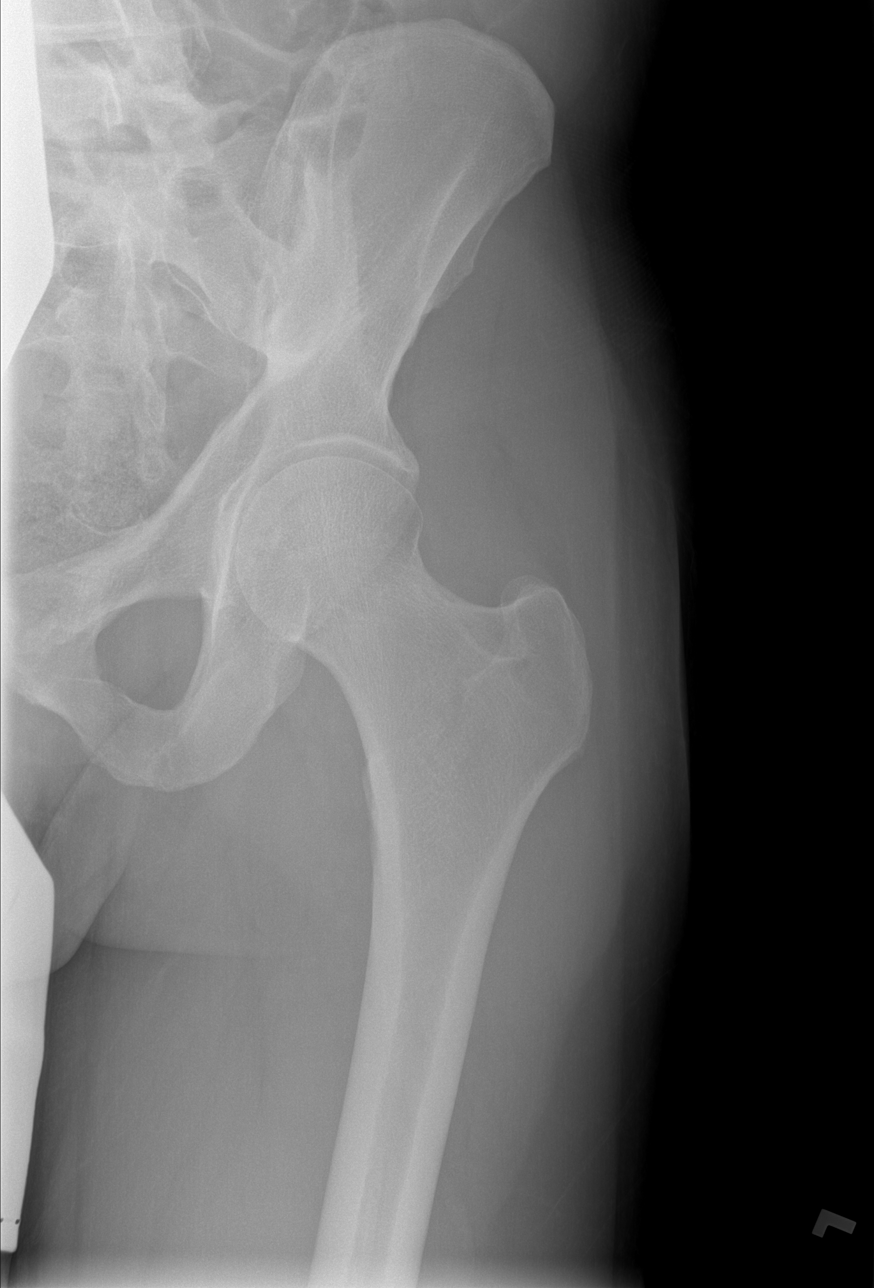

[t femur with knee ap left]
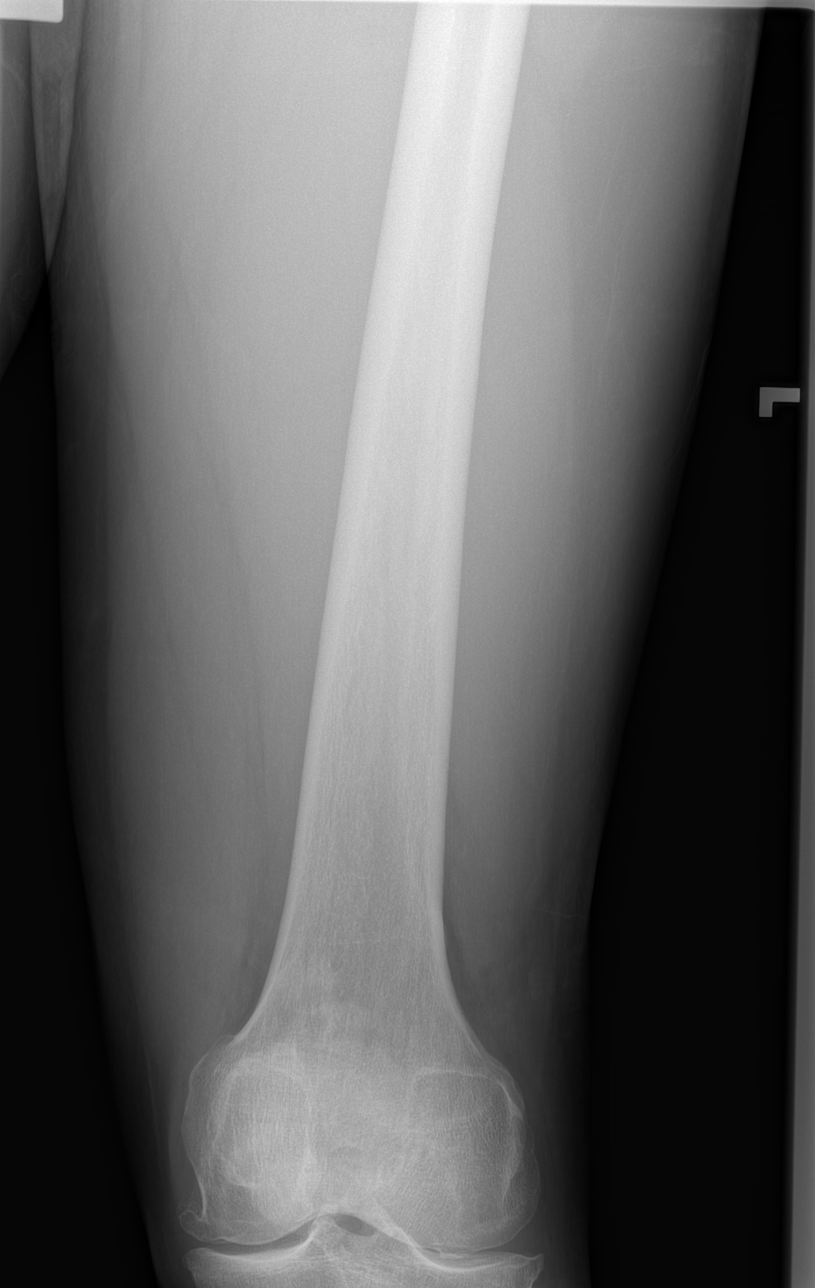

[t femur with hip lat left]
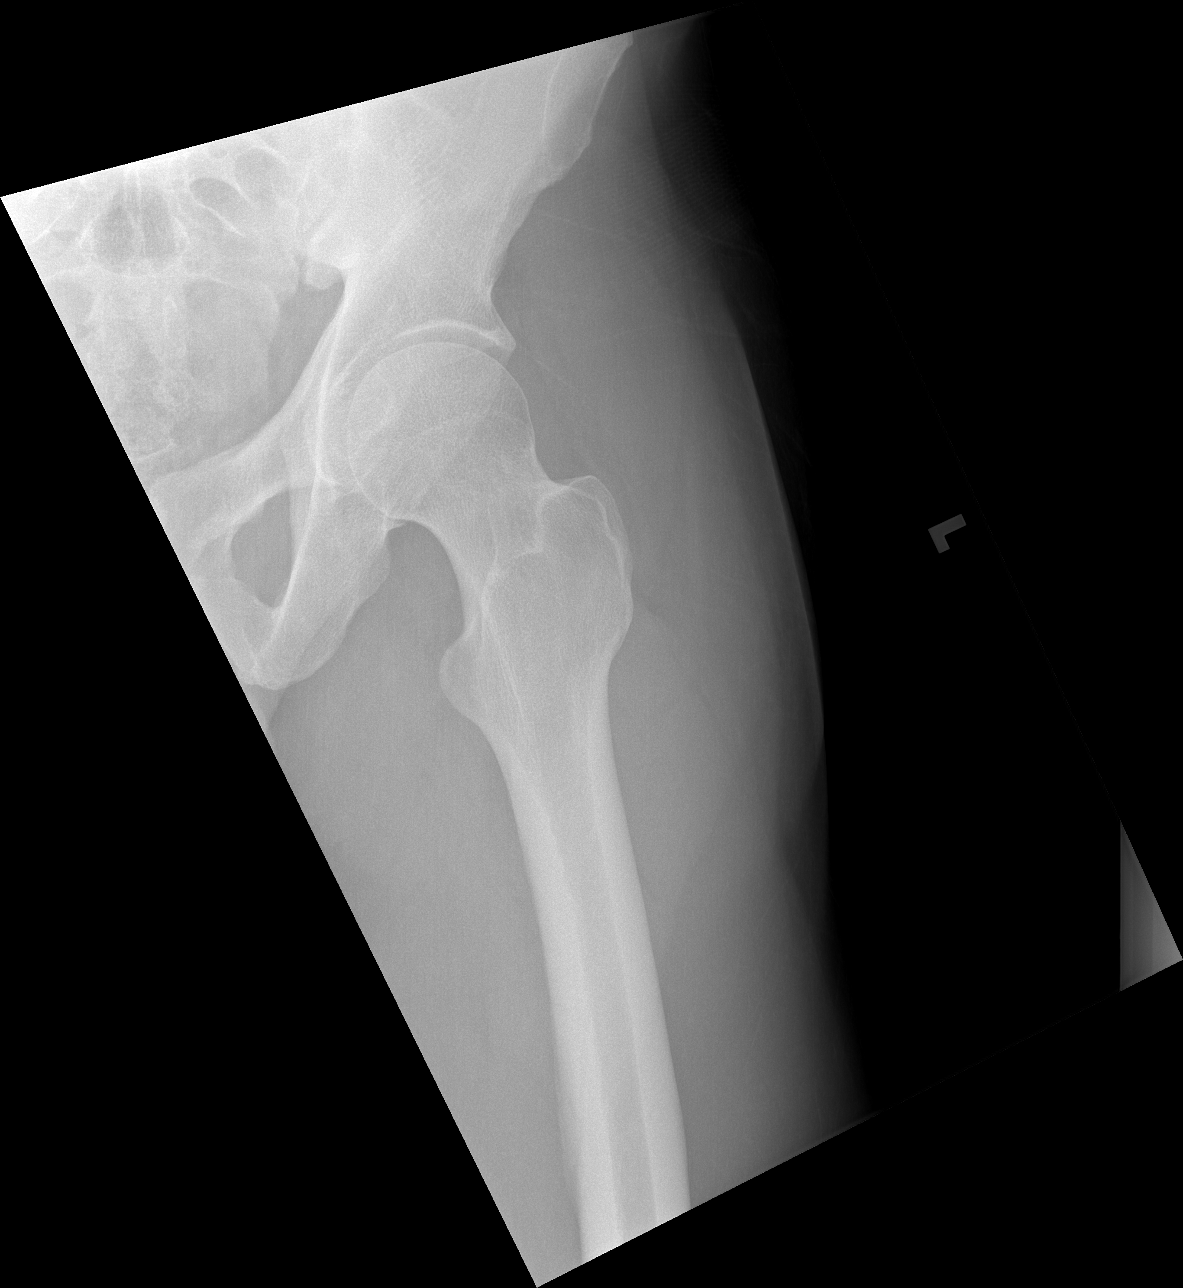

[t femur with knee lat left]
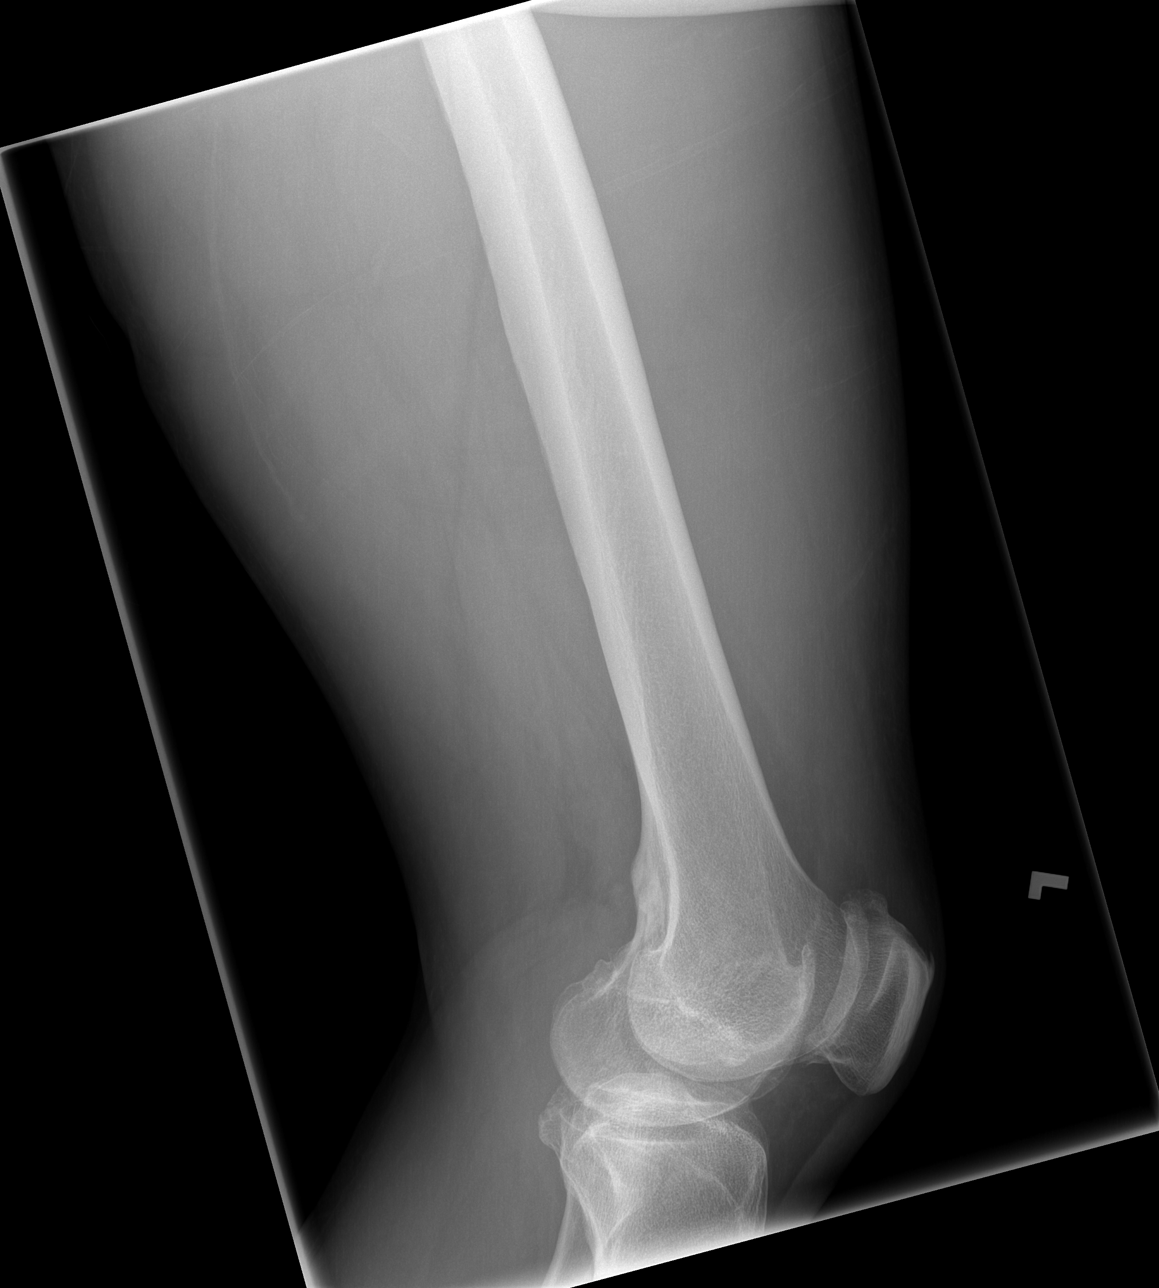

[4 of 4 positions shown; findings below may reference images not displayed]

FINDINGS: The hip joint space is preserved. The acetabulum femoral head
remains smoothly rounded. The femoral head, neck, intertrochanteric
regions femoral shaft are unremarkable. Limited visualization of the
components of the knee reveal mild osteoarthritic changes of all 3
joint compartments. There is no joint effusion. The soft tissues of
the thigh are unremarkable.
IMPRESSION: There are degenerative changes of the left knee. There is no acute
fracture or dislocation of the left femur.

## 2017-04-17 ENCOUNTER — Telehealth: Payer: Self-pay | Admitting: Internal Medicine

## 2017-04-17 NOTE — Telephone Encounter (Signed)
Pt states that he is having issues with loose stools and some incontinence. Reports he may cough and pass some stool. Discussed with pt that he could try taking Imodium to see if it helped. Pt scheduled to see Ellouise Newer PA 04/26/17@ 8:45am. Pt aware of appt.

## 2017-04-26 ENCOUNTER — Ambulatory Visit: Payer: BC Managed Care – PPO | Admitting: Physician Assistant

## 2017-05-07 ENCOUNTER — Ambulatory Visit (INDEPENDENT_AMBULATORY_CARE_PROVIDER_SITE_OTHER): Payer: BC Managed Care – PPO | Admitting: Family Medicine

## 2017-05-07 ENCOUNTER — Encounter: Payer: Self-pay | Admitting: Family Medicine

## 2017-05-07 VITALS — BP 135/80 | HR 52 | Temp 98.7°F

## 2017-05-07 DIAGNOSIS — S96911A Strain of unspecified muscle and tendon at ankle and foot level, right foot, initial encounter: Secondary | ICD-10-CM | POA: Diagnosis not present

## 2017-05-07 NOTE — Patient Instructions (Signed)
WE NOW OFFER   [facility] FAST TR[facility]!!!  SAME DAY Appointments for ACUTE CARE  Such as: Sprains, Injuries, cuts, abrasions, rashes, muscle pain, joint pain, back pain Colds, flu, sore throats, headache, allergies, cough, fever  Ear pain, sinus and eye infections Abdominal pain, nausea, vomiting, diarrhea, upset stomach Animal/insect bites  3 Easy Ways to Schedule: Walk-In Scheduling Call in scheduling Mychart Sign-up: [email or web address]         

## 2017-05-07 NOTE — Progress Notes (Signed)
   Subjective:    Patient ID: [name], male    DOB: 11/19/1965, 51 y.o.   MRN: [id number]  HPI Here for the onset this morning of pain in the bottom of the right foot. No recent trauma but he did work out in the gym last night, and he was doing some leg presses and calf lifts that he usually does not do. He took some Ibuprofen which helped a little.    Review of Systems  Constitutional: Negative.   Respiratory: Negative.   Cardiovascular: Negative.   Musculoskeletal: Positive for gait problem and myalgias.       Objective:   Physical Exam  Constitutional:  Limping   Cardiovascular: Normal rate, regular rhythm, normal heart sounds and intact distal pulses.   Pulmonary/Chest: Effort normal and breath sounds normal.  Musculoskeletal:  The arch of the right foot is extremely tender, no swelling or warmth or erythema          Assessment & Plan:  Arch strain. Rest, warm soaks with Epsom salts. Try 800 mg of Ibuprofen 3-4 times daily. Wear gel arch supports inside the shoes. No running or jumping or leg lifts for several weeks.  Alysia Penna, MD

## 2017-05-08 ENCOUNTER — Telehealth: Payer: Self-pay | Admitting: Family Medicine

## 2017-05-08 NOTE — Telephone Encounter (Signed)
Pt states that his copay is too high to come back in for another visit. Pt was unsure of the directions he was given from his last visit. Pull up last office note and gave pt instructions per office visit from 05/07/17. Advise pt to call back to schedule appt if sx's don't improve. Pt verbalized understanding.

## 2017-05-08 NOTE — Telephone Encounter (Signed)
Relation to DA:PTCK Call back number:[phone] Pharmacy: [id number] - [place], [place]. [phone] (Phone) [phone] (Fax)   D.O.D Dr. Lorelei Pont  Reason for call:  Patient was seen by Dr. Sarajane Jews for pain in the bottom of the right foot, patient states RX was not prescribed and unclear exactly what to do. Patient states ibuprofen 800MG  is not working,please advise

## 2017-05-08 NOTE — Telephone Encounter (Signed)
Please give him a call back- I am sorry that his foot is still bothering him.   We are glad to see him to take a look at his foot for him at his convenience, we could also do an x-ray for him if he wants to be seen. Without seeing his foot myself I am not sure how best to proceed

## 2017-05-08 NOTE — Telephone Encounter (Signed)
Patient checking on the status of message below, did inform patient D.O.D currently in clinic and nurse will follow up

## 2017-05-21 ENCOUNTER — Other Ambulatory Visit: Payer: Self-pay | Admitting: Family Medicine

## 2017-05-21 MED FILL — AMLODIPINE BESYLATE 10 MG T: 10 | 90 days supply | Qty: 90 | Fill #0

## 2017-05-21 MED FILL — ATORVASTATIN 20 MG TABLET: 20 | 90 days supply | Qty: 90 | Fill #0

## 2017-06-24 ENCOUNTER — Ambulatory Visit (INDEPENDENT_AMBULATORY_CARE_PROVIDER_SITE_OTHER): Payer: BC Managed Care – PPO | Admitting: Family Medicine

## 2017-06-24 ENCOUNTER — Encounter: Payer: Self-pay | Admitting: Family Medicine

## 2017-06-24 VITALS — BP 126/74 | HR 61 | Temp 97.5°F | Ht 73.0 in | Wt 235.4 lb

## 2017-06-24 DIAGNOSIS — E785 Hyperlipidemia, unspecified: Secondary | ICD-10-CM | POA: Diagnosis not present

## 2017-06-24 DIAGNOSIS — I10 Essential (primary) hypertension: Secondary | ICD-10-CM | POA: Diagnosis not present

## 2017-06-24 DIAGNOSIS — Z23 Encounter for immunization: Secondary | ICD-10-CM | POA: Diagnosis not present

## 2017-06-24 NOTE — Assessment & Plan Note (Signed)
Tolerating statin, encouraged heart healthy diet, avoid trans fats, minimize simple carbs and saturated fats. Increase exercise as tolerated 

## 2017-06-24 NOTE — Patient Instructions (Signed)

## 2017-06-24 NOTE — Assessment & Plan Note (Signed)
Well controlled, no changes to meds. Encouraged heart healthy diet such as the DASH diet and exercise as tolerated.  °

## 2017-06-24 NOTE — Progress Notes (Signed)
Patient ID: Bobby Castaneda., male    DOB: 1966/02/07  Age: 51 y.o. MRN: [id number]    Subjective:  Subjective  HPI Bobby Castaneda. presents for f/u bp and cholesterol.  He would also like a flu shot.  No complaints.    Review of Systems  Constitutional: Negative.   HENT: Negative for congestion, ear pain, hearing loss, nosebleeds, postnasal drip, rhinorrhea, sinus pressure, sneezing and tinnitus.   Eyes: Negative for photophobia, discharge, itching and visual disturbance.  Respiratory: Negative.   Cardiovascular: Negative.   Gastrointestinal: Negative for abdominal distention, abdominal pain, anal bleeding, blood in stool and constipation.  Endocrine: Negative.   Genitourinary: Negative.   Musculoskeletal: Negative.   Skin: Negative.   Allergic/Immunologic: Negative.   Neurological: Negative for dizziness, weakness, light-headedness, numbness and headaches.  Psychiatric/Behavioral: Negative for agitation, confusion, decreased concentration, dysphoric mood, sleep disturbance and suicidal ideas. The patient is not nervous/anxious.     History Past Medical History:  Diagnosis Date  . Bell's palsy    History  . Hyperlipidemia   . Hypertension   . Sleep apnea    uses CPAP nightly    He has a past surgical history that includes Knee surgery; Adenoidectomy (08/2011); and Closed reduction hand fracture (12/2010).   His family history includes Alzheimer's disease in his maternal grandmother; Cancer in his father; Cancer (age of onset: 29) in his maternal aunt; Cancer (age of onset: 63) in his mother; Colon cancer in his father; Heart disease in his maternal grandfather and maternal uncle; Hyperlipidemia in his father and maternal grandfather; Hypertension in his father, maternal grandfather, maternal grandmother, maternal uncle, maternal uncle, mother, sister, and unknown relative; Stroke in his father, maternal grandfather, and maternal uncle.He reports that he has never smoked. He  has never used smokeless tobacco. He reports that he does not drink alcohol or use drugs.  Current Outpatient Prescriptions on File Prior to Visit  Medication Sig Dispense Refill  . amLODipine (NORVASC) 10 MG tablet TAKE 1 TABLET (10 MG TOTAL) BY MOUTH DAILY. (Patient taking differently: at bedtime. TAKE 1 TABLET (10 MG TOTAL) BY MOUTH DAILY.) 90 tablet 1  . atorvastatin (LIPITOR) 20 MG tablet TAKE 1 TABLET (20 MG TOTAL) BY MOUTH AT BEDTIME. 90 tablet 1  . carvedilol (COREG CR) 20 MG 24 hr capsule Take 1 capsule (20 mg total) by mouth daily. 90 capsule 1   Current Facility-Administered Medications on File Prior to Visit  Medication Dose Route Frequency Provider Last Rate Last Dose  . 0.9 %  sodium chloride infusion  500 mL Intravenous Continuous Irene Shipper, MD         Objective:  Objective  Physical Exam  Constitutional: He is oriented to person, place, and time. Vital signs are normal. He appears well-developed and well-nourished. He is sleeping.  HENT:  Head: Normocephalic and atraumatic.  Mouth/Throat: Oropharynx is clear and moist.  Eyes: Pupils are equal, round, and reactive to light. EOM are normal.  Neck: Normal range of motion. Neck supple. No thyromegaly present.  Cardiovascular: Normal rate and regular rhythm.   No murmur heard. Pulmonary/Chest: Effort normal and breath sounds normal. No respiratory distress. He has no wheezes. He has no rales. He exhibits no tenderness.  Musculoskeletal: He exhibits no edema or tenderness.  Neurological: He is alert and oriented to person, place, and time.  Skin: Skin is warm and dry.  Psychiatric: He has a normal mood and affect. His behavior is normal. Judgment and thought content normal.  Nursing  note and vitals reviewed.  BP 126/74 (BP Location: Right Arm, Patient Position: Sitting, Cuff Size: Normal)   Pulse 61   Temp (!) 97.5 F (36.4 C) (Oral)   Ht 6\' 1"  (1.854 m)   Wt 235 lb 6.4 oz (106.8 kg)   SpO2 98%   BMI 31.06 kg/m    Wt Readings from Last 3 Encounters:  06/24/17 235 lb 6.4 oz (106.8 kg)  03/28/17 234 lb (106.1 kg)  03/14/17 234 lb 3.2 oz (106.2 kg)     Lab Results  Component Value Date   WBC 4.1 12/21/2016   HGB 13.2 12/21/2016   HCT 38.8 12/21/2016   PLT 262 12/21/2016   GLUCOSE 88 12/21/2016   CHOL 147 12/21/2016   TRIG 78 12/21/2016   HDL 52 12/21/2016   LDLCALC 79 12/21/2016   ALT 25 12/21/2016   AST 25 12/21/2016   NA 140 12/21/2016   K 3.5 12/21/2016   CL 103 12/21/2016   CREATININE 1.05 12/21/2016   BUN 15 12/21/2016   CO2 28 12/21/2016   TSH 1.83 12/21/2016   PSA 2.26 07/19/2015   MICROALBUR 2.2 (H) 02/17/2013    Dg Elbow Complete Left  Result Date: 07/04/2015 CLINICAL DATA:  Left elbow pain since falling on the elbow 8 weeks ago playing basketball. EXAM: LEFT ELBOW - COMPLETE 3+ VIEW COMPARISON:  None. FINDINGS: Moderate posterior olecranon spur formation and triceps tendon calcification. Mild anterior olecranon spur formation. Mild anterior coracohumeral spur formation. No fracture, dislocation or effusion. IMPRESSION: No fracture.  Degenerative changes. Electronically Signed   By: Claudie Revering M.D.   On: 07/04/2015 16:52     Assessment & Plan:  Plan  I am having [name] maintain his carvedilol, amLODipine, and atorvastatin. We will continue to administer sodium chloride.  No orders of the defined types were placed in this encounter.   Problem List Items Addressed This Visit      Unprioritized   Essential hypertension    .Well controlled, no changes to meds. Encouraged heart healthy diet such as the DASH diet and exercise as tolerated.       Relevant Orders   Lipid panel   Comprehensive metabolic panel   Hyperlipidemia    Tolerating statin, encouraged heart healthy diet, avoid trans fats, minimize simple carbs and saturated fats. Increase exercise as tolerated       Other Visit Diagnoses    Hyperlipidemia LDL goal <100    -  Primary   Relevant Orders    Lipid panel   Comprehensive metabolic panel   Need for prophylactic vaccination and inoculation against influenza       Relevant Orders   Flu Vaccine QUAD 36+ mos IM (Fluarix & Fluzone Quad PF (Completed)    flu shot given  Follow-up: Return in about 6 months (around 12/22/2017) for annual exam, fasting.  Ann Held, DO

## 2017-06-25 LAB — COMPREHENSIVE METABOLIC PANEL
ALT: 19 U/L (ref 0–53)
AST: 20 U/L (ref 0–37)
Albumin: 4.1 g/dL (ref 3.5–5.2)
Alkaline Phosphatase: 78 U/L (ref 39–117)
BUN: 14 mg/dL (ref 6–23)
CALCIUM: 9.4 mg/dL (ref 8.4–10.5)
CHLORIDE: 103 meq/L (ref 96–112)
CO2: 30 mEq/L (ref 19–32)
Creatinine, Ser: 1.26 mg/dL (ref 0.40–1.50)
GFR: 77.12 mL/min (ref >60.00)
Glucose, Bld: 84 mg/dL (ref 70–99)
POTASSIUM: 3.3 meq/L — AB (ref 3.5–5.1)
Sodium: 139 mEq/L (ref 135–145)
Total Bilirubin: 0.4 mg/dL (ref 0.2–1.2)
Total Protein: 7.8 g/dL (ref 6.0–8.3)

## 2017-06-25 LAB — LIPID PANEL
CHOLESTEROL: 143 mg/dL (ref 0–200)
HDL: 49.2 mg/dL (ref >39.00)
LDL CALC: 67 mg/dL (ref 0–99)
NonHDL: 94.18
TRIGLYCERIDES: 134 mg/dL (ref 0.0–149.0)
Total CHOL/HDL Ratio: 3
VLDL: 26.8 mg/dL (ref 0.0–40.0)

## 2017-06-29 ENCOUNTER — Other Ambulatory Visit: Payer: Self-pay | Admitting: Family Medicine

## 2017-06-29 DIAGNOSIS — I10 Essential (primary) hypertension: Secondary | ICD-10-CM

## 2017-07-08 ENCOUNTER — Encounter: Payer: Self-pay | Admitting: Family Medicine

## 2017-07-08 ENCOUNTER — Telehealth: Payer: Self-pay | Admitting: Family Medicine

## 2017-07-08 MED ORDER — LABETALOL HCL 100 MG PO TABS: 100.0000 mg | ORAL_TABLET | Freq: Two times a day (BID) | ORAL | 5 refills | Status: DC

## 2017-07-08 NOTE — Telephone Encounter (Signed)
Caller name: Gilles Relation to pt: self  Call back number: [phone] Pharmacy:[id number] - [place], [place] [address]  Reason for call: Pt states his insurance has changed to tier 1 and will not cover his rx for carvedilol (COREG CR) 20 MG 24 hr capsule and pt would like to know if he can have prescription changed to labetalol 20 mg which was recommended by pharmacist. Please advise.

## 2017-08-30 MED FILL — ATORVASTATIN 20 MG TABLET: 20 | 90 days supply | Qty: 90 | Fill #1

## 2017-08-30 MED FILL — AMLODIPINE BESYLATE 10 MG T: 10 | 90 days supply | Qty: 90 | Fill #1

## 2017-10-17 ENCOUNTER — Ambulatory Visit: Payer: Self-pay | Admitting: *Deleted

## 2017-10-17 MED ORDER — METOPROLOL SUCCINATE ER 25 MG PO TB24: 25.0000 mg | ORAL_TABLET | Freq: Every day | ORAL | 3 refills | Status: DC

## 2017-10-17 NOTE — Telephone Encounter (Signed)
Patient has questions about Labetalol 100 mg bid. Started 07/2017. p pec Uses:[facility]  Answer Assessment - Initial Assessment Questions 1. SYMPTOMS: "Do you have any symptoms?"     Patient has had increased GI symptoms on the new medication- gas and bloating. He also states he has had decreased energy as well. He states his BP has been normal and the range is 140/80 when he checks it. He is completely out now and wants to know if he can get a different medication.  2. SEVERITY: If symptoms are present, ask "Are they mild, moderate or severe?"     Moderate  Protocols used: MEDICATION QUESTION CALL-A-AH

## 2017-10-17 NOTE — Telephone Encounter (Signed)
Rx sent in as advised Pt notified

## 2017-10-17 NOTE — Telephone Encounter (Signed)
toprol xl 25 mg #30  1 po qd, 2 refill s

## 2017-11-28 ENCOUNTER — Other Ambulatory Visit: Payer: Self-pay | Admitting: Family Medicine

## 2017-11-29 ENCOUNTER — Encounter: Payer: Self-pay | Admitting: Adult Health

## 2017-11-29 ENCOUNTER — Ambulatory Visit (INDEPENDENT_AMBULATORY_CARE_PROVIDER_SITE_OTHER): Payer: BC Managed Care – PPO | Admitting: Adult Health

## 2017-11-29 VITALS — BP 120/70 | HR 53 | Ht 73.0 in | Wt 229.0 lb

## 2017-11-29 DIAGNOSIS — E669 Obesity, unspecified: Secondary | ICD-10-CM

## 2017-11-29 DIAGNOSIS — G4733 Obstructive sleep apnea (adult) (pediatric): Secondary | ICD-10-CM

## 2017-11-29 MED FILL — AMLODIPINE BESYLATE 10 MG T: 10 | 90 days supply | Qty: 90 | Fill #0

## 2017-11-29 MED FILL — ATORVASTATIN 20 MG TABLET: 20 | 90 days supply | Qty: 90 | Fill #0

## 2017-11-29 NOTE — Assessment & Plan Note (Signed)
Controlled on CPAP  Plan  Patient Instructions  Change Auto set CPAP to 10 to 16 cm  Order for new machine  Download in 1 month .  Wear CPAP each night  Work on healthy weight  Do not drive if sleepy  follow up in 1 year with Dr. Elsworth Soho  And As needed

## 2017-11-29 NOTE — Progress Notes (Signed)
@Patient  ID: Bobby Castaneda., male    DOB: [birth year], 52 y.o.   MRN: [id number]  Chief Complaint  Patient presents with  . Follow-up    OSA     Referring provider: Ann Held, *  HPI: 52 year old male followed for severe sleep apnea  Significant tests/ events  HST 11/2011: AHI 40/hr with desat to 77% Auto 2013: Optimal pressure 16cm  11/29/2017 Followup ; OSA  Presents for a one year follow-up for sleep apnea.  Patient says he is doing well on CPAP.  He feels rested with no significant daytime sleepiness.  Download shows excellent compliance with average usage at 4 hours.  Patient is on CPAP 16 Summers H2O.  AHI is 5.6. Says machine is getting very loud . Feels pressure is too high at times.  Wants to get a new machine .     Allergies  Allergen Reactions  . Ace Inhibitors Other (See Comments)    angioedema    Immunization History  Administered Date(s) Administered  . Influenza Split 10/22/2011  . Influenza Whole 07/05/2008  . Influenza,inj,Quad PF,6+ Mos 07/19/2015, 12/21/2016, 06/24/2017  . Td 10/27/2004  . Tdap 07/19/2015    Past Medical History:  Diagnosis Date  . Bell's palsy    History  . Hyperlipidemia   . Hypertension   . Sleep apnea    uses CPAP nightly    Tobacco History: Social History   Tobacco Use  Smoking Status Never Smoker  Smokeless Tobacco Never Used   Counseling given: Not Answered   Outpatient Encounter Medications as of 11/29/2017  Medication Sig  . amLODipine (NORVASC) 10 MG tablet TAKE 1 TABLET (10 MG TOTAL) BY MOUTH DAILY. (Patient taking differently: at bedtime. TAKE 1 TABLET (10 MG TOTAL) BY MOUTH DAILY.)  . atorvastatin (LIPITOR) 20 MG tablet TAKE 1 TABLET (20 MG TOTAL) BY MOUTH AT BEDTIME.  . metoprolol succinate (TOPROL-XL) 25 MG 24 hr tablet Take 1 tablet (25 mg total) by mouth daily.  . [DISCONTINUED] labetalol (NORMODYNE) 100 MG tablet Take 1 tablet (100 mg total) by mouth 2 (two) times daily.    Facility-Administered Encounter Medications as of 11/29/2017  Medication  . 0.9 %  sodium chloride infusion     Review of Systems  Constitutional:   No  weight loss, night sweats,  Fevers, chills, fatigue, or  lassitude.  HEENT:   No headaches,  Difficulty swallowing,  Tooth/dental problems, or  Sore throat,                No sneezing, itching, ear ache, nasal congestion, post nasal drip,   CV:  No chest pain,  Orthopnea, PND, swelling in lower extremities, anasarca, dizziness, palpitations, syncope.   GI  No heartburn, indigestion, abdominal pain, nausea, vomiting, diarrhea, change in bowel habits, loss of appetite, bloody stools.   Resp: No shortness of breath with exertion or at rest.  No excess mucus, no productive cough,  No non-productive cough,  No coughing up of blood.  No change in color of mucus.  No wheezing.  No chest wall deformity  Skin: no rash or lesions.  GU: no dysuria, change in color of urine, no urgency or frequency.  No flank pain, no hematuria   MS:  No joint pain or swelling.  No decreased range of motion.  No back pain.    Physical Exam  BP 120/70 (BP Location: Left Arm, Cuff Size: Normal)   Pulse (!) 53   Ht 6\' 1"  (1.854 m)  Wt 229 lb (103.9 kg)   SpO2 100%   BMI 30.21 kg/m   GEN: A/Ox3; pleasant , NAD, obese    HEENT:  /AT,  EACs-clear, TMs-wnl, NOSE-clear, THROAT-clear, no lesions, no postnasal drip or exudate noted. Class 2-3 MP airway   NECK:  Supple w/ fair ROM; no JVD; normal carotid impulses w/o bruits; no thyromegaly or nodules palpated; no lymphadenopathy.    RESP  Clear  P & A; w/o, wheezes/ rales/ or rhonchi. no accessory muscle use, no dullness to percussion  CARD:  RRR, no m/r/g, no peripheral edema, pulses intact, no cyanosis or clubbing.  GI:   Soft & nt; nml bowel sounds; no organomegaly or masses detected.   Musco: Warm bil, no deformities or joint swelling noted.   Neuro: alert, no focal deficits noted.    Skin:  Warm, no lesions or rashes    Lab Results:   BMET  BNP No results found for: BNP  ProBNP No results found for: PROBNP  Imaging: No results found.   Assessment & Plan:   OSA (obstructive sleep apnea) Controlled on CPAP  Plan  Patient Instructions  Change Auto set CPAP to 10 to 16 cm  Order for new machine  Download in 1 month .  Wear CPAP each night  Work on healthy weight  Do not drive if sleepy  follow up in 1 year with Dr. Elsworth Soho  And As needed         Obesity (BMI 30-39.9) Wt loss .      Rexene Edison, NP 11/29/2017

## 2017-11-29 NOTE — Assessment & Plan Note (Signed)
Wt loss  

## 2017-11-29 NOTE — Patient Instructions (Addendum)
Change Auto set CPAP to 10 to 16 cm  Order for new machine  Download in 1 month .  Wear CPAP each night  Work on healthy weight  Do not drive if sleepy  follow up in 1 year with Dr. Elsworth Soho  And As needed

## 2017-12-11 ENCOUNTER — Telehealth: Payer: Self-pay | Admitting: Pulmonary Disease

## 2017-12-11 NOTE — Telephone Encounter (Signed)
[facility] please advise on status of new cpap order.  Order was placed on 2/22.  Thanks!

## 2017-12-11 NOTE — Telephone Encounter (Signed)
Called and spoke to Shelby she stated she called the patient on the 02/26 and stated this is a repap set up so goes thru a different dept and they are working on it. I called pt and made him aware he was satisfied with the call

## 2017-12-11 NOTE — Telephone Encounter (Signed)
Called and spoke with patient, he states that the order for the new CPAP machine was sent back in February and he is wanting to check the status on that. Advise patient I would call [facility].   Called [facility] and was on hold for 10 minutes, will call back.

## 2018-02-24 ENCOUNTER — Encounter: Payer: Self-pay | Admitting: Internal Medicine

## 2018-03-06 MED FILL — AMLODIPINE BESYLATE 10 MG T: 10 | 90 days supply | Qty: 90 | Fill #1

## 2018-03-06 MED FILL — ATORVASTATIN 20 MG TABLET: 20 | 90 days supply | Qty: 90 | Fill #1

## 2018-03-08 ENCOUNTER — Other Ambulatory Visit: Payer: Self-pay

## 2018-03-08 ENCOUNTER — Emergency Department (HOSPITAL_COMMUNITY)
Admission: EM | Admit: 2018-03-08 | Discharge: 2018-03-08 | Disposition: A | Discharge status: Home / Self Care | Payer: BC Managed Care – PPO | Attending: Emergency Medicine | Admitting: Emergency Medicine

## 2018-03-08 ENCOUNTER — Ambulatory Visit (INDEPENDENT_AMBULATORY_CARE_PROVIDER_SITE_OTHER): Payer: BC Managed Care – PPO | Admitting: Family Medicine

## 2018-03-08 ENCOUNTER — Encounter: Payer: Self-pay | Admitting: Family Medicine

## 2018-03-08 ENCOUNTER — Encounter (HOSPITAL_COMMUNITY): Payer: Self-pay

## 2018-03-08 ENCOUNTER — Emergency Department (HOSPITAL_COMMUNITY): Payer: BC Managed Care – PPO

## 2018-03-08 VITALS — BP 150/98 | HR 64 | Temp 98.2°F | Ht 73.0 in | Wt 229.2 lb

## 2018-03-08 DIAGNOSIS — Y929 Unspecified place or not applicable: Secondary | ICD-10-CM | POA: Diagnosis not present

## 2018-03-08 DIAGNOSIS — Y9367 Activity, basketball: Secondary | ICD-10-CM | POA: Insufficient documentation

## 2018-03-08 DIAGNOSIS — S6992XA Unspecified injury of left wrist, hand and finger(s), initial encounter: Secondary | ICD-10-CM

## 2018-03-08 DIAGNOSIS — Y999 Unspecified external cause status: Secondary | ICD-10-CM | POA: Diagnosis not present

## 2018-03-08 DIAGNOSIS — N181 Chronic kidney disease, stage 1: Secondary | ICD-10-CM | POA: Insufficient documentation

## 2018-03-08 DIAGNOSIS — S60932A Unspecified superficial injury of left thumb, initial encounter: Secondary | ICD-10-CM | POA: Insufficient documentation

## 2018-03-08 DIAGNOSIS — Z79899 Other long term (current) drug therapy: Secondary | ICD-10-CM | POA: Insufficient documentation

## 2018-03-08 DIAGNOSIS — I129 Hypertensive chronic kidney disease with stage 1 through stage 4 chronic kidney disease, or unspecified chronic kidney disease: Secondary | ICD-10-CM | POA: Diagnosis not present

## 2018-03-08 DIAGNOSIS — S60012A Contusion of left thumb without damage to nail, initial encounter: Secondary | ICD-10-CM | POA: Diagnosis not present

## 2018-03-08 DIAGNOSIS — W51XXXA Accidental striking against or bumped into by another person, initial encounter: Secondary | ICD-10-CM | POA: Diagnosis not present

## 2018-03-08 MED ORDER — NAPROXEN 250 MG PO TABS
500.0000 mg | ORAL_TABLET | Freq: Once | ORAL | Status: AC
  Administered 2018-03-08: 500 mg via ORAL
  Filled 2018-03-08: qty 2

## 2018-03-08 MED ORDER — NAPROXEN 500 MG PO TABS: 500.0000 mg | ORAL_TABLET | Freq: Two times a day (BID) | ORAL | 0 refills | Status: DC

## 2018-03-08 NOTE — ED Notes (Signed)
Pt verbalized understanding discharge instructions and denies any further needs or questions at this time. VS stable, ambulatory and steady gait.   

## 2018-03-08 NOTE — ED Provider Notes (Signed)
[facility] EMERGENCY DEPARTMENT Provider Note   CSN: [id number] Arrival date & time: 03/08/18  1109     History   Chief Complaint Chief Complaint  Patient presents with  . Finger Injury    HPI Bobby Castaneda. is a 52 y.o. male with a hx of HTN, hyperlipidemia, and sleep apnea who presents to the ED for left thumb injury that occurred this AM.  Patient states that he had the ball in his hands after coming down from a rebound when another player struck his left proximal phalanx region pressing it between the other player's hand and the ball.  She states he has had constant pain since the injury.  Rates his current pain a 3 out of 10 in severity, has not had medication prior to arrival, states it is worse with movement of the thumb however he is able to move it.  No specific alleviating factors. Patient seen by Dr. Sarajane Jews and [facility]- was sent to [facility] orthopedic walk in clinic to evaluate for possible fracture. Patient unable to afford co-pay at orthopedic clinic prompting ER visit.  Denies numbness, weakness, or any other areas of injury.  Patient is right-hand dominant.  Patient did not take his blood pressure medication last night.  HPI  Past Medical History:  Diagnosis Date  . Bell's palsy    History  . Hyperlipidemia   . Hypertension   . Sleep apnea    uses CPAP nightly    Patient Active Problem List   Diagnosis Date Noted  . Preventative health care 12/23/2016  . Angioedema 07/02/2016  . Chronic kidney disease (CKD), stage I 02/08/2016  . Injury of left elbow 07/06/2015  . Allergic rhinitis 03/10/2015  . Left thigh pain 02/10/2015  . Obesity (BMI 30-39.9) 02/23/2014  . 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 Bell's palsy 05/19/2013  . OSA (obstructive sleep apnea) 11/13/2011  . Enlarged thyroid 12/29/2010  . FRACTURE, RIGHT HAND 12/15/2010  . SINUSITIS - ACUTE-NOS 09/04/2010  . KNEE PAIN, RIGHT 12/05/2009  . SHOULDER PAIN, RIGHT  10/25/2009  . CELLULITIS AND ABSCESS OF TRUNK 06/22/2008  . Hyperlipidemia 03/22/2008  . ABSCESS, SKIN 06/18/2007  . Essential hypertension 02/25/2007    Past Surgical History:  Procedure Laterality Date  . ADENOIDECTOMY  08/2011   [place]--- gso ent  . CLOSED REDUCTION HAND FRACTURE  12/2010   Right hand  . KNEE SURGERY     bilat scops        Home Medications    Prior to Admission medications   Medication Sig Start Date End Date Taking? Authorizing Provider  amLODipine (NORVASC) 10 MG tablet TAKE 1 TABLET (10 MG TOTAL) BY MOUTH DAILY. 11/29/17   Roma Schanz R, DO  atorvastatin (LIPITOR) 20 MG tablet TAKE 1 TABLET (20 MG TOTAL) BY MOUTH AT BEDTIME. 12/23/16   Carollee Herter, Alferd Apa, DO  metoprolol succinate (TOPROL-XL) 25 MG 24 hr tablet Take 1 tablet (25 mg total) by mouth daily. 10/17/17   Ann Held, DO    Family History Family History  Problem Relation Age of Onset  . Hypertension Mother   . Cancer Mother 8       breast  . Hypertension Father   . Hyperlipidemia Father   . Stroke Father   . Cancer Father        prostate  . Colon cancer Father   . Hypertension Sister   . Cancer Maternal Aunt 37       breast  .  Heart disease Maternal Uncle        mi  . Hypertension Maternal Uncle   . Hypertension Maternal Grandmother   . Alzheimer's disease Maternal Grandmother   . Heart disease Maternal Grandfather        mi  . Hypertension Maternal Grandfather   . Hyperlipidemia Maternal Grandfather   . Stroke Maternal Grandfather   . Stroke Maternal Uncle   . Hypertension Maternal Uncle   . Hypertension Unknown   . Colon polyps Neg Hx   . Rectal cancer Neg Hx   . Stomach cancer Neg Hx     Social History Social History   Tobacco Use  . Smoking status: Never Smoker  . Smokeless tobacco: Never Used  Substance Use Topics  . Alcohol use: No    Alcohol/week: 0.0 oz  . Drug use: No     Allergies   Ace inhibitors   Review of Systems Review of  Systems  Constitutional: Negative for chills and fever.  Musculoskeletal:       Positive for R thumb pain  Neurological: Negative for weakness and numbness.     Physical Exam Updated Vital Signs BP (!) 166/102 (BP Location: Right Arm)   Pulse 61   Temp 98.1 F (36.7 C) (Oral)   Resp 16   Ht 6\' 1"  (1.854 m)   Wt 103.9 kg (229 lb)   SpO2 100%   BMI 30.21 kg/m   Physical Exam  Constitutional: He appears well-developed and well-nourished. No distress.  HENT:  Head: Normocephalic and atraumatic.  Eyes: Conjunctivae are normal. Right eye exhibits no discharge. Left eye exhibits no discharge.  Cardiovascular:  2+ symmetric radial pulses.  Musculoskeletal:  No obvious deformity, appreciable swelling, erythema, ecchymosis, or open wounds.  Upper extremities: Patient has normal range of motion at the elbows and wrists bilaterally.  He has normal range of motion to all digits (MCP/PIP/DIP) with the exception of the left thumb.  Patient is able to perform normal range of motion at the MCP joint, there is limitation in flexion and extension at the interphalangeal joint.  Patient is able to perform each of these motions somewhat.  Patient is tender to palpation over the left proximal phalanx and interphalangeal joint, otherwise nontender.  No snuffbox tenderness.  NVI distally.  Neurological: He is alert.  Clear speech.   Psychiatric: He has a normal mood and affect. His behavior is normal. Thought content normal.  Nursing note and vitals reviewed.   ED Treatments / Results  Labs (all labs ordered are listed, but only abnormal results are displayed) Labs Reviewed - No data to display  EKG None  Radiology Dg Hand Complete Left  Result Date: 03/08/2018 CLINICAL DATA:  Left thumb pain since an injury playing basketball this morning. Initial encounter. EXAM: LEFT HAND - COMPLETE 3+ VIEW COMPARISON:  None. FINDINGS: A tiny well corticated fragment is seen along the radial aspect of the  first MCP joint. No donor site is identified. No fracture or dislocation is seen. Soft tissues are unremarkable. IMPRESSION: Tiny well corticated bone fragment along the radial margin of the first MCP joint has a chronic appearance may be due to old injury. No acute abnormality is seen Electronically Signed   By: Inge Rise M.D.   On: 03/08/2018 13:46    Procedures Procedures (including critical care time)  Medications Ordered in ED Medications - No data to display   Initial Impression / Assessment and Plan / ED Course  I have reviewed the triage  vital signs and the nursing notes.  Pertinent labs & imaging results that were available during my care of the patient were reviewed by me and considered in my medical decision making (see chart for details).   Patient presents s/p L thumb injury complaining of discomfort.  Patient nontoxic-appearing, no apparent distress, vitals WNL with the exception of elevated blood pressure, patient did not take his blood pressure medication last night, doubt hypertensive emergency, patient aware of need for medication compliance and recheck.  On exam patient is tender to palpation over the proximal phalanx and interphalangeal joint of the left thumb.  Otherwise nontender.  No snuffbox tenderness.  He is able to somewhat flex and extend at the interphalangeal joint, therefore doubt tendon avulsion.  NVI distally.  X-ray obtained and negative for acute abnormality- no acute fractures/dislocations.  Will place patient in splint and provide naproxen for pain and swelling.  Recommended PRICE. I discussed results, treatment plan, need for follow-up, and return precautions with the patient. Provided opportunity for questions, patient confirmed understanding and is in agreement with plan.   Final Clinical Impressions(s) / ED Diagnoses   Final diagnoses:  Injury of left thumb, initial encounter    ED Discharge Orders        Ordered    naproxen (NAPROSYN) 500 MG  tablet  2 times daily     03/08/18 [address] [place], PA-C 03/08/18 1548    Isla Pence, MD 03/09/18 [phone]

## 2018-03-08 NOTE — Progress Notes (Signed)
Orthopedic Tech Progress Note Patient Details:  Bobby Castaneda. 1966/04/18 [id number]  Ortho Devices Type of Ortho Device: Thumb spica splint Ortho Device/Splint Interventions: Application   Post Interventions Instructions Provided: Care of device   [name] 03/08/2018, 2:42 PM

## 2018-03-08 NOTE — Discharge Instructions (Signed)
Please read and follow all provided instructions.  You have been seen today for an injury to your left thumb.   Tests performed today include: An x-ray of the affected area - does NOT show any broken bones or dislocations.  Vital signs. See below for your results today.   Home care instructions: -- *PRICE in the first 24-48 hours after injury: Protect (with brace, splint, sling), if given by your provider Rest Ice- Do not apply ice pack directly to your skin, place towel or similar between your skin and ice/ice pack. Apply ice for 20 min, then remove for 40 min while awake Compression- Wear brace, elastic bandage, splint as directed by your provider Elevate affected extremity above the level of your heart when not walking around for the first 24-48 hours   Naproxen is a nonsteroidal anti-inflammatory medication that will help with pain and swelling. Be sure to take this medication as prescribed with food, 1 pill every 12 hours,  It should be taken with food, as it can cause stomach upset, and more seriously, stomach bleeding. Do not take other nonsteroidal anti-inflammatory medications with this such as Advil, Motrin, or Aleve. We have prescribed you new medication(s) today. Discuss the medications prescribed today with your pharmacist as they can have adverse effects and interactions with your other medicines including over the counter and prescribed medications. Seek medical evaluation if you start to experience new or abnormal symptoms after taking one of these medicines, seek care immediately if you start to experience difficulty breathing, feeling of your throat closing, facial swelling, or rash as these could be indications of a more serious allergic reaction  Follow-up instructions: Please follow-up with your primary care provider or the provided orthopedic physician (bone specialist) if you continue to have significant pain in 1 week. In this case you may have a more severe injury that  requires further care.   Return instructions:  Please return if your fingers or hand are numb or tingling, appear gray or blue, or you have severe pain (also elevate the hand and loosen splint or wrap if you were given one) Please return to the Emergency Department if you experience worsening symptoms.  Please return if you have any other emergent concerns. Additional Information:  Your vital signs today were: BP (!) 166/102 (BP Location: Right Arm)    Pulse 61    Temp 98.1 F (36.7 C) (Oral)    Resp 16    Ht 6\' 1"  (1.854 m)    Wt 103.9 kg (229 lb)    SpO2 100%    BMI 30.21 kg/m  If your blood pressure (BP) was elevated above 135/85 this visit, please have this repeated by your doctor within one month. ---------------

## 2018-03-08 NOTE — ED Triage Notes (Signed)
Pt states that he was playing basketball today and broke his finger states that he was seen at urgent care and they confirmed that it was broken but that he was unable to pay the $80 co pay at [facility] so he came here to have it fixed. Also reports that his BP is a little high because he has been out of his BP medications.

## 2018-03-08 NOTE — Progress Notes (Signed)
   Subjective:    Patient ID: Bobby Neer., male    DOB: 02-Sep-1966, 52 y.o.   MRN: [id number]  HPI Here for an injury to the left thumb that occurred about an hour ago. He was playing basketball and another player struck his land with their hand. He felt and heard a "pop" and he has had a fair amount of pain since then.   Review of Systems  Constitutional: Negative.   Respiratory: Negative.   Cardiovascular: Negative.   Musculoskeletal: Positive for arthralgias.  Neurological: Negative.        Objective:   Physical Exam  Constitutional: He appears well-developed and well-nourished.  Cardiovascular: Normal rate, regular rhythm, normal heart sounds and intact distal pulses.  Pulmonary/Chest: Effort normal and breath sounds normal.  Musculoskeletal:  The left thumb is extremely tender along the proximal phalanx. No swelling or crepitus. ROM is very limited due to pain          Assessment & Plan:  Possible thumb fracture. We will send him directly over to the [facility] orthopedic walk-in clinic.  Alysia Penna, MD

## 2018-06-16 ENCOUNTER — Other Ambulatory Visit: Payer: Self-pay | Admitting: Family Medicine

## 2018-06-18 MED FILL — AMLODIPINE BESYLATE 10 MG T: 10 | 90 days supply | Qty: 90 | Fill #0

## 2018-06-18 MED FILL — ATORVASTATIN CALCIUM 20 MG: 20 | 90 days supply | Qty: 90 | Fill #0

## 2018-06-19 ENCOUNTER — Encounter (HOSPITAL_COMMUNITY): Payer: Self-pay | Admitting: Student

## 2018-06-19 ENCOUNTER — Emergency Department (HOSPITAL_COMMUNITY)
Admission: EM | Admit: 2018-06-19 | Discharge: 2018-06-19 | Disposition: A | Discharge status: Home / Self Care | Payer: BC Managed Care – PPO | Attending: Emergency Medicine | Admitting: Emergency Medicine

## 2018-06-19 DIAGNOSIS — I129 Hypertensive chronic kidney disease with stage 1 through stage 4 chronic kidney disease, or unspecified chronic kidney disease: Secondary | ICD-10-CM | POA: Insufficient documentation

## 2018-06-19 DIAGNOSIS — M542 Cervicalgia: Secondary | ICD-10-CM | POA: Insufficient documentation

## 2018-06-19 DIAGNOSIS — N181 Chronic kidney disease, stage 1: Secondary | ICD-10-CM | POA: Diagnosis not present

## 2018-06-19 DIAGNOSIS — Z79899 Other long term (current) drug therapy: Secondary | ICD-10-CM | POA: Insufficient documentation

## 2018-06-19 DIAGNOSIS — M549 Dorsalgia, unspecified: Secondary | ICD-10-CM | POA: Diagnosis not present

## 2018-06-19 MED ORDER — METHOCARBAMOL 500 MG PO TABS: 500.0000 mg | ORAL_TABLET | Freq: Three times a day (TID) | ORAL | 0 refills | Status: DC | PRN

## 2018-06-19 MED ORDER — NAPROXEN 500 MG PO TABS: 500.0000 mg | ORAL_TABLET | Freq: Two times a day (BID) | ORAL | 0 refills | Status: DC
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
## 2018-06-27 MED FILL — HYDROCHLOROTHIAZIDE 12.5 MG: 12.5 | 30 days supply | Qty: 30 | Fill #0

## 2018-07-01 ENCOUNTER — Encounter: Payer: Self-pay | Admitting: Family Medicine

## 2018-07-01 ENCOUNTER — Ambulatory Visit (INDEPENDENT_AMBULATORY_CARE_PROVIDER_SITE_OTHER): Payer: Self-pay | Admitting: Family Medicine

## 2018-07-01 VITALS — BP 132/80 | HR 63 | Temp 98.5°F | Resp 16 | Ht 73.0 in | Wt 230.4 lb

## 2018-07-01 DIAGNOSIS — J069 Acute upper respiratory infection, unspecified: Secondary | ICD-10-CM

## 2018-07-01 DIAGNOSIS — Z23 Encounter for immunization: Secondary | ICD-10-CM

## 2018-07-01 DIAGNOSIS — M549 Dorsalgia, unspecified: Secondary | ICD-10-CM

## 2018-07-01 MED ORDER — LORATADINE 10 MG PO TABS: 10.0000 mg | ORAL_TABLET | Freq: Every day | ORAL | 11 refills | Status: DC

## 2018-07-01 MED ORDER — METHOCARBAMOL 500 MG PO TABS: 500.0000 mg | ORAL_TABLET | Freq: Three times a day (TID) | ORAL | 0 refills | Status: DC | PRN

## 2018-07-01 MED ORDER — FLUTICASONE PROPIONATE 50 MCG/ACT NA SUSP: 2.0000 | Freq: Every day | NASAL | 6 refills | Status: DC

## 2018-07-01 MED FILL — FLUTICASONE PROP 50 MCG SPR: 50 | 30 days supply | Qty: 16 | Fill #0

## 2018-07-01 MED FILL — METHOCARBAMOL 500 MG TABLET: 500 | 10 days supply | Qty: 30 | Fill #0

## 2018-07-01 MED FILL — LORATADINE 10 MG TABLET: 10 | 100 days supply | Qty: 100 | Fill #0

## 2018-07-01 NOTE — Patient Instructions (Signed)

## 2018-07-01 NOTE — Progress Notes (Signed)
Patient ID: Bobby Neer., male    DOB: 17-Jan-1966  Age: 52 y.o. MRN: [id number]    Subjective:  Subjective  HPI Bobby Edelen. presents for f/u from [facility] about 11 days.   Review of Systems  Constitutional: Negative for appetite change, diaphoresis, fatigue and unexpected weight change.  Eyes: Negative for pain, redness and visual disturbance.  Respiratory: Negative for cough, chest tightness, shortness of breath and wheezing.   Cardiovascular: Negative for chest pain, palpitations and leg swelling.  Endocrine: Negative for cold intolerance, heat intolerance, polydipsia, polyphagia and polyuria.  Genitourinary: Negative for difficulty urinating, dysuria and frequency.  Musculoskeletal: Positive for back pain. Negative for gait problem, joint swelling and myalgias.  Neurological: Negative for dizziness, light-headedness, numbness and headaches.    History Past Medical History:  Diagnosis Date  . Bell's palsy    History  . Hyperlipidemia   . Hypertension   . Sleep apnea    uses CPAP nightly    He has a past surgical history that includes Knee surgery; Adenoidectomy (08/2011); and Closed reduction hand fracture (12/2010).   His family history includes Alzheimer's disease in his maternal grandmother; Cancer in his father; Cancer (age of onset: 28) in his maternal aunt; Cancer (age of onset: 7) in his mother; Colon cancer in his father; Heart disease in his maternal grandfather and maternal uncle; Hyperlipidemia in his father and maternal grandfather; Hypertension in his father, maternal grandfather, maternal grandmother, maternal uncle, maternal uncle, mother, sister, and unknown relative; Stroke in his father, maternal grandfather, and maternal uncle.He reports that he has never smoked. He has never used smokeless tobacco. He reports that he does not drink alcohol or use drugs.  Current Outpatient Medications on File Prior to Visit  Medication Sig Dispense Refill  . amLODipine  (NORVASC) 10 MG tablet Take 1 tablet (10 mg total) by mouth daily. Needs ov 90 tablet 0  . atorvastatin (LIPITOR) 20 MG tablet Take 1 tablet (20 mg total) by mouth at bedtime. Needs ov 90 tablet 0  . metoprolol succinate (TOPROL-XL) 25 MG 24 hr tablet Take 1 tablet (25 mg total) by mouth daily. 90 tablet 3   Current Facility-Administered Medications on File Prior to Visit  Medication Dose Route Frequency Provider Last Rate Last Dose  . 0.9 %  sodium chloride infusion  500 mL Intravenous Continuous Irene Shipper, MD         Objective:  Objective  Physical Exam  Constitutional: He is oriented to person, place, and time. Vital signs are normal. He appears well-developed and well-nourished. He is sleeping.  HENT:  Head: Normocephalic and atraumatic.  Mouth/Throat: Oropharynx is clear and moist.  Eyes: Pupils are equal, round, and reactive to light. EOM are normal.  Neck: Normal range of motion. Neck supple. No thyromegaly present.  Cardiovascular: Normal rate and regular rhythm.  No murmur heard. Pulmonary/Chest: Effort normal and breath sounds normal. No respiratory distress. He has no wheezes. He has no rales. He exhibits no tenderness.  Musculoskeletal: He exhibits tenderness. He exhibits no edema.       Thoracic back: He exhibits tenderness, pain and spasm.  Neurological: He is alert and oriented to person, place, and time.  Skin: Skin is warm and dry.  Psychiatric: He has a normal mood and affect. His behavior is normal. Judgment and thought content normal.  Nursing note and vitals reviewed.  BP 132/80 (BP Location: Right Arm, Cuff Size: Large)   Pulse 63   Temp 98.5 F (36.9 C) (  Oral)   Resp 16   Ht 6\' 1"  (1.854 m)   Wt 230 lb 6.4 oz (104.5 kg)   SpO2 97%   BMI 30.40 kg/m  Wt Readings from Last 3 Encounters:  07/01/18 230 lb 6.4 oz (104.5 kg)  03/08/18 229 lb (103.9 kg)  03/08/18 229 lb 4 oz (104 kg)     Lab Results  Component Value Date   WBC 4.1 12/21/2016   HGB  13.2 12/21/2016   HCT 38.8 12/21/2016   PLT 262 12/21/2016   GLUCOSE 84 06/24/2017   CHOL 143 06/24/2017   TRIG 134.0 06/24/2017   HDL 49.20 06/24/2017   LDLCALC 67 06/24/2017   ALT 19 06/24/2017   AST 20 06/24/2017   NA 139 06/24/2017   K 3.3 (L) 06/24/2017   CL 103 06/24/2017   CREATININE 1.26 06/24/2017   BUN 14 06/24/2017   CO2 30 06/24/2017   TSH 1.83 12/21/2016   PSA 2.26 07/19/2015   MICROALBUR 2.2 (H) 02/17/2013    No results found.   Assessment & Plan:  Plan  I have discontinued Bobby Hammock Jr.'s naproxen. I am also having him start on loratadine and fluticasone. Additionally, I am having him maintain his metoprolol succinate, amLODipine, atorvastatin, and methocarbamol. We will continue to administer sodium chloride.  Meds ordered this encounter  Medications  . loratadine (CLARITIN) 10 MG tablet    Sig: Take 1 tablet (10 mg total) by mouth daily.    Dispense:  30 tablet    Refill:  11  . fluticasone (FLONASE) 50 MCG/ACT nasal spray    Sig: Place 2 sprays into both nostrils daily.    Dispense:  16 g    Refill:  6  . DISCONTD: methocarbamol (ROBAXIN) 500 MG tablet    Sig: Take 1 tablet (500 mg total) by mouth every 8 (eight) hours as needed.    Dispense:  30 tablet    Refill:  0  . methocarbamol (ROBAXIN) 500 MG tablet    Sig: Take 1 tablet (500 mg total) by mouth every 8 (eight) hours as needed.    Dispense:  30 tablet    Refill:  0    Problem List Items Addressed This Visit    None    Visit Diagnoses    Viral upper respiratory tract infection    -  Primary   Relevant Medications   loratadine (CLARITIN) 10 MG tablet   fluticasone (FLONASE) 50 MCG/ACT nasal spray   Mid back pain       Relevant Medications   methocarbamol (ROBAXIN) 500 MG tablet    heat , rest , muscle relaxor per orders   Follow-up: Return if symptoms worsen or fail to improve.  Ann Held, DO

## 2018-07-02 NOTE — Addendum Note (Signed)
Addended by: Kem Boroughs D on: 07/02/2018 08:31 AM   Modules accepted: Orders

## 2018-07-08 ENCOUNTER — Ambulatory Visit (INDEPENDENT_AMBULATORY_CARE_PROVIDER_SITE_OTHER): Payer: BC Managed Care – PPO | Admitting: Family Medicine

## 2018-07-08 ENCOUNTER — Encounter: Payer: Self-pay | Admitting: Family Medicine

## 2018-07-08 VITALS — BP 136/86 | HR 66 | Temp 98.7°F | Resp 16 | Ht 72.4 in | Wt 230.0 lb

## 2018-07-08 DIAGNOSIS — K635 Polyp of colon: Secondary | ICD-10-CM

## 2018-07-08 DIAGNOSIS — E785 Hyperlipidemia, unspecified: Secondary | ICD-10-CM | POA: Diagnosis not present

## 2018-07-08 DIAGNOSIS — Z1211 Encounter for screening for malignant neoplasm of colon: Secondary | ICD-10-CM | POA: Diagnosis not present

## 2018-07-08 DIAGNOSIS — Z0000 Encounter for general adult medical examination without abnormal findings: Secondary | ICD-10-CM

## 2018-07-08 DIAGNOSIS — I10 Essential (primary) hypertension: Secondary | ICD-10-CM | POA: Diagnosis not present

## 2018-07-08 LAB — POC HEMOCCULT BLD/STL (OFFICE/1-CARD/DIAGNOSTIC)
Card #1 Date: 10012019
Fecal Occult Blood, POC: NEGATIVE

## 2018-07-08 NOTE — Patient Instructions (Signed)

## 2018-07-08 NOTE — Assessment & Plan Note (Signed)
Encouraged heart healthy diet, increase exercise, avoid trans fats, consider a krill oil cap daily 

## 2018-07-08 NOTE — Assessment & Plan Note (Signed)
ghm utd Check labs See AVS 

## 2018-07-08 NOTE — Assessment & Plan Note (Signed)
Well controlled, no changes to meds. Encouraged heart healthy diet such as the DASH diet and exercise as tolerated.  °

## 2018-07-08 NOTE — Progress Notes (Signed)
Patient ID: Bobby Neer., male    DOB: May 15, 1966  Age: 52 y.o. MRN: [id number]    Subjective:  Subjective  HPI Bobby Castaneda. presents for cpe -- no new complaints   Review of Systems  Constitutional: Negative for chills and fever.  HENT: Negative for congestion and hearing loss.   Eyes: Negative for discharge.  Respiratory: Negative for cough and shortness of breath.   Cardiovascular: Negative for chest pain, palpitations and leg swelling.  Gastrointestinal: Negative for abdominal pain, blood in stool, constipation, diarrhea, nausea and vomiting.  Genitourinary: Negative for dysuria, frequency, hematuria and urgency.  Musculoskeletal: Negative for back pain and myalgias.  Skin: Negative for rash.  Allergic/Immunologic: Negative for environmental allergies.  Neurological: Negative for dizziness, weakness and headaches.  Hematological: Does not bruise/bleed easily.  Psychiatric/Behavioral: Negative for suicidal ideas. The patient is not nervous/anxious.     History Past Medical History:  Diagnosis Date  . Bell's palsy    History  . Hyperlipidemia   . Hypertension   . Sleep apnea    uses CPAP nightly    He has a past surgical history that includes Knee surgery; Adenoidectomy (08/2011); and Closed reduction hand fracture (12/2010).   His family history includes Alzheimer's disease in his maternal grandmother; Cancer in his father; Cancer (age of onset: 34) in his maternal aunt; Cancer (age of onset: 18) in his mother; Colon cancer in his father; Heart disease in his maternal grandfather and maternal uncle; Hyperlipidemia in his father and maternal grandfather; Hypertension in his father, maternal grandfather, maternal grandmother, maternal uncle, maternal uncle, mother, sister, and unknown relative; Stroke in his father, maternal grandfather, and maternal uncle.He reports that he has never smoked. He has never used smokeless tobacco. He reports that he does not drink  alcohol or use drugs.  Current Outpatient Medications on File Prior to Visit  Medication Sig Dispense Refill  . amLODipine (NORVASC) 10 MG tablet Take 1 tablet (10 mg total) by mouth daily. Needs ov 90 tablet 0  . atorvastatin (LIPITOR) 20 MG tablet Take 1 tablet (20 mg total) by mouth at bedtime. Needs ov 90 tablet 0  . fluticasone (FLONASE) 50 MCG/ACT nasal spray Place 2 sprays into both nostrils daily. 16 g 6  . loratadine (CLARITIN) 10 MG tablet Take 1 tablet (10 mg total) by mouth daily. 30 tablet 11  . methocarbamol (ROBAXIN) 500 MG tablet Take 1 tablet (500 mg total) by mouth every 8 (eight) hours as needed. 30 tablet 0  . metoprolol succinate (TOPROL-XL) 25 MG 24 hr tablet Take 1 tablet (25 mg total) by mouth daily. 90 tablet 3   Current Facility-Administered Medications on File Prior to Visit  Medication Dose Route Frequency Provider Last Rate Last Dose  . 0.9 %  sodium chloride infusion  500 mL Intravenous Continuous Irene Shipper, MD         Objective:  Objective  Physical Exam  Constitutional: He is oriented to person, place, and time. Vital signs are normal. He appears well-developed and well-nourished. He is sleeping.  HENT:  Head: Normocephalic and atraumatic.  Mouth/Throat: Oropharynx is clear and moist.  Eyes: Pupils are equal, round, and reactive to light. EOM are normal.  Neck: Normal range of motion. Neck supple. No thyromegaly present.  Cardiovascular: Normal rate and regular rhythm.  No murmur heard. Pulmonary/Chest: Effort normal and breath sounds normal. No respiratory distress. He has no wheezes. He has no rales. He exhibits no tenderness.  Musculoskeletal: He exhibits no edema  or tenderness.  Neurological: He is alert and oriented to person, place, and time.  Skin: Skin is warm and dry.  Psychiatric: He has a normal mood and affect. His behavior is normal. Judgment and thought content normal.  Nursing note and vitals reviewed.  BP 136/86 (BP Location: Left  Arm, Cuff Size: Large)   Pulse 66   Temp 98.7 F (37.1 C) (Oral)   Resp 16   Ht 6' 0.4" (1.839 m)   Wt 230 lb (104.3 kg)   SpO2 97%   BMI 30.85 kg/m  Wt Readings from Last 3 Encounters:  07/08/18 230 lb (104.3 kg)  07/01/18 230 lb 6.4 oz (104.5 kg)  03/08/18 229 lb (103.9 kg)     Lab Results  Component Value Date   WBC 4.1 12/21/2016   HGB 13.2 12/21/2016   HCT 38.8 12/21/2016   PLT 262 12/21/2016   GLUCOSE 84 06/24/2017   CHOL 143 06/24/2017   TRIG 134.0 06/24/2017   HDL 49.20 06/24/2017   LDLCALC 67 06/24/2017   ALT 19 06/24/2017   AST 20 06/24/2017   NA 139 06/24/2017   K 3.3 (L) 06/24/2017   CL 103 06/24/2017   CREATININE 1.26 06/24/2017   BUN 14 06/24/2017   CO2 30 06/24/2017   TSH 1.83 12/21/2016   PSA 2.26 07/19/2015   MICROALBUR 2.2 (H) 02/17/2013    No results found.   Assessment & Plan:  Plan  I am having Bobby Neer. maintain his metoprolol succinate, amLODipine, atorvastatin, loratadine, fluticasone, and methocarbamol. We will continue to administer sodium chloride.  No orders of the defined types were placed in this encounter.   Problem List Items Addressed This Visit      Unprioritized   Essential hypertension    Well controlled, no changes to meds. Encouraged heart healthy diet such as the DASH diet and exercise as tolerated.       Relevant Orders   CBC with Differential/Platelet   Comprehensive metabolic panel   Lipid panel   TSH   PSA   Hyperlipidemia    Encouraged heart healthy diet, increase exercise, avoid trans fats, consider a krill oil cap daily      Relevant Orders   CBC with Differential/Platelet   Comprehensive metabolic panel   Lipid panel   TSH   PSA   Preventative health care - Primary    ghm utd Check labs See AVS      Relevant Orders   CBC with Differential/Platelet   Comprehensive metabolic panel   Lipid panel   TSH   PSA    Other Visit Diagnoses    Polyp of colon, unspecified part of colon,  unspecified type       Relevant Orders   Ambulatory referral to Gastroenterology   Colon cancer screening       Relevant Orders   POC Hemoccult Bld/Stl (1-Cd Office Dx) (Completed)      Follow-up: Return in about 6 months (around 01/07/2019), or if symptoms worsen or fail to improve, for hypertension, hyperlipidemia.  Ann Held, DO

## 2018-07-09 LAB — CBC WITH DIFFERENTIAL/PLATELET
BASOS ABS: 0 10*3/uL (ref 0.0–0.1)
Basophils Relative: 1.1 % (ref 0.0–3.0)
EOS ABS: 0.2 10*3/uL (ref 0.0–0.7)
Eosinophils Relative: 4.2 % (ref 0.0–5.0)
HCT: 38 % — ABNORMAL LOW (ref 39.0–52.0)
Hemoglobin: 12.8 g/dL — ABNORMAL LOW (ref 13.0–17.0)
Lymphocytes Relative: 55.9 % — ABNORMAL HIGH (ref 12.0–46.0)
Lymphs Abs: 2.2 10*3/uL (ref 0.7–4.0)
MCHC: 33.7 g/dL (ref 30.0–36.0)
MCV: 86.1 fl (ref 78.0–100.0)
MONO ABS: 0.3 10*3/uL (ref 0.1–1.0)
Monocytes Relative: 8.2 % (ref 3.0–12.0)
Neutro Abs: 1.2 10*3/uL — ABNORMAL LOW (ref 1.4–7.7)
Neutrophils Relative %: 30.6 % — ABNORMAL LOW (ref 43.0–77.0)
Platelets: 229 10*3/uL (ref 150.0–400.0)
RBC: 4.41 Mil/uL (ref 4.22–5.81)
RDW: 13.3 % (ref 11.5–15.5)
WBC: 4 10*3/uL (ref 4.0–10.5)

## 2018-07-09 LAB — COMPREHENSIVE METABOLIC PANEL
ALBUMIN: 4.2 g/dL (ref 3.5–5.2)
ALT: 16 U/L (ref 0–53)
AST: 24 U/L (ref 0–37)
Alkaline Phosphatase: 78 U/L (ref 39–117)
BUN: 16 mg/dL (ref 6–23)
CHLORIDE: 101 meq/L (ref 96–112)
CO2: 33 mEq/L — ABNORMAL HIGH (ref 19–32)
Calcium: 9.3 mg/dL (ref 8.4–10.5)
Creatinine, Ser: 1.48 mg/dL (ref 0.40–1.50)
GFR: 63.8 mL/min (ref >60.00)
Glucose, Bld: 91 mg/dL (ref 70–99)
Potassium: 3.1 mEq/L — ABNORMAL LOW (ref 3.5–5.1)
SODIUM: 141 meq/L (ref 135–145)
TOTAL PROTEIN: 7.7 g/dL (ref 6.0–8.3)
Total Bilirubin: 0.4 mg/dL (ref 0.2–1.2)

## 2018-07-09 LAB — TSH: TSH: 1.56 u[IU]/mL (ref 0.35–4.50)

## 2018-07-09 LAB — LIPID PANEL
CHOL/HDL RATIO: 4
Cholesterol: 137 mg/dL (ref 0–200)
HDL: 37.5 mg/dL — AB (ref >39.00)
LDL CALC: 78 mg/dL (ref 0–99)
NonHDL: 99.03
TRIGLYCERIDES: 107 mg/dL (ref 0.0–149.0)
VLDL: 21.4 mg/dL (ref 0.0–40.0)

## 2018-07-09 LAB — PSA: PSA: 2.28 ng/mL (ref 0.10–4.00)

## 2018-07-17 ENCOUNTER — Other Ambulatory Visit: Payer: Self-pay | Admitting: *Deleted

## 2018-07-17 ENCOUNTER — Encounter: Payer: Self-pay | Admitting: *Deleted

## 2018-07-17 DIAGNOSIS — E876 Hypokalemia: Secondary | ICD-10-CM

## 2018-07-17 MED FILL — POTASSIUM CL ER 20 MEQ TAB: 20 | 30 days supply | Qty: 30 | Fill #0

## 2018-07-25 MED FILL — HYDROCHLOROTHIAZIDE 12.5 MG: 12.5 | 30 days supply | Qty: 30 | Fill #1

## 2018-08-18 MED FILL — POTASSIUM CL ER 20 MEQ TAB: 20 | 30 days supply | Qty: 30 | Fill #1

## 2018-08-28 MED FILL — HYDROCHLOROTHIAZIDE 12.5 MG: 12.5 | 30 days supply | Qty: 30 | Fill #2

## 2018-09-18 ENCOUNTER — Other Ambulatory Visit: Payer: Self-pay | Admitting: Family Medicine

## 2018-09-19 MED FILL — POTASSIUM CHLORIDE CRYS ER: 20 | 30 days supply | Qty: 30 | Fill #2

## 2018-09-23 ENCOUNTER — Other Ambulatory Visit: Payer: Self-pay

## 2018-09-23 MED ORDER — ATORVASTATIN CALCIUM 20 MG PO TABS: 20.0000 mg | ORAL_TABLET | Freq: Every day | ORAL | 0 refills | Status: DC

## 2018-09-23 MED ORDER — AMLODIPINE BESYLATE 10 MG PO TABS: 10.0000 mg | ORAL_TABLET | Freq: Every day | ORAL | 0 refills | Status: DC

## 2018-09-23 MED FILL — ATORVASTATIN CALCIUM 20 MG: 20 | 90 days supply | Qty: 90 | Fill #0

## 2018-09-23 MED FILL — AMLODIPINE BESYLATE 10 MG T: 10 | 90 days supply | Qty: 90 | Fill #0

## 2018-10-06 MED FILL — HYDROCHLOROTHIAZIDE 12.5 MG: 12.5 | 30 days supply | Qty: 30 | Fill #3

## 2018-11-20 MED FILL — HYDROCHLOROTHIAZIDE 12.5 MG: 12.5 | 30 days supply | Qty: 30 | Fill #4

## 2018-12-25 ENCOUNTER — Other Ambulatory Visit: Payer: Self-pay | Admitting: Family Medicine

## 2018-12-25 MED FILL — HYDROCHLOROTHIAZIDE 12.5 MG: 12.5 | 30 days supply | Qty: 30 | Fill #5 | Status: TO

## 2018-12-26 MED FILL — AMLODIPINE BESYLATE 10 MG T: 10 | 90 days supply | Qty: 90 | Fill #0

## 2018-12-26 MED FILL — ATORVASTATIN 20 MG TABLET: 20 | 90 days supply | Qty: 90 | Fill #0

## 2019-01-05 ENCOUNTER — Encounter: Payer: Self-pay | Admitting: *Deleted

## 2019-01-08 ENCOUNTER — Ambulatory Visit: Payer: BC Managed Care – PPO | Admitting: Family Medicine

## 2019-01-08 ENCOUNTER — Encounter: Payer: Self-pay | Admitting: Family Medicine

## 2019-01-08 ENCOUNTER — Other Ambulatory Visit: Payer: Self-pay

## 2019-01-08 ENCOUNTER — Ambulatory Visit (INDEPENDENT_AMBULATORY_CARE_PROVIDER_SITE_OTHER): Payer: BC Managed Care – PPO | Admitting: Family Medicine

## 2019-01-08 DIAGNOSIS — I10 Essential (primary) hypertension: Secondary | ICD-10-CM | POA: Diagnosis not present

## 2019-01-08 DIAGNOSIS — E785 Hyperlipidemia, unspecified: Secondary | ICD-10-CM

## 2019-01-08 NOTE — Progress Notes (Signed)
Virtual Visit via Video Note  I connected with Bobby Castaneda. on 01/08/19 at  2:00 PM EDT by a video enabled telemedicine application and verified that I am speaking with the correct person using two identifiers.   I discussed the limitations of evaluation and management by telemedicine and the availability of in person appointments. The patient expressed understanding and agreed to proceed.  History of Present Illness:   Pt is home requesting f/u bp and cholesterol  Pt has no complaints and refills have already been sent in for him   Observations/Objective: 130/82  Afebrile  Norm rr   Assessment and Plan: 1. Essential hypertension Well controlled, no changes to meds. Encouraged heart healthy diet such as the DASH diet and exercise as tolerated.  - Lipid panel; Future - Comprehensive metabolic panel; Future  2. Hyperlipidemia, unspecified hyperlipidemia type Tolerating statin, encouraged heart healthy diet, avoid trans fats, minimize simple carbs and saturated fats. Increase exercise as tolerated - Lipid panel; Future - Comprehensive metabolic panel; Future Follow Up Instructions:    I discussed the assessment and treatment plan with the patient. The patient was provided an opportunity to ask questions and all were answered. The patient agreed with the plan and demonstrated an understanding of the instructions.   The patient was advised to call back or seek an in-person evaluation if the symptoms worsen or if the condition fails to improve as anticipated.  I provided 15 minutes of non-face-to-face time during this encounter.   Ann Held, DO

## 2019-01-09 ENCOUNTER — Other Ambulatory Visit (INDEPENDENT_AMBULATORY_CARE_PROVIDER_SITE_OTHER): Payer: BC Managed Care – PPO

## 2019-01-09 ENCOUNTER — Other Ambulatory Visit: Payer: Self-pay

## 2019-01-09 DIAGNOSIS — E785 Hyperlipidemia, unspecified: Secondary | ICD-10-CM

## 2019-01-09 DIAGNOSIS — I10 Essential (primary) hypertension: Secondary | ICD-10-CM | POA: Diagnosis not present

## 2019-01-09 NOTE — Addendum Note (Signed)
Addended by: Caffie Pinto on: 01/09/2019 12:00 PM   Modules accepted: Orders

## 2019-01-09 NOTE — Telephone Encounter (Signed)
Pt schedule for lab appt today 01-09-2019 at 3:00pm. Done

## 2019-01-09 NOTE — Telephone Encounter (Signed)
-----   Message from Doylene Canning, [place] sent at 01/08/2019  2:55 PM EDT ----- Needs lab only appointment as soon as he can get in. ----- Message ----- From: Ann Held, DO Sent: 01/08/2019   2:11 PM EDT To: Doylene Canning, CMA  Needs lab app

## 2019-01-10 LAB — COMPREHENSIVE METABOLIC PANEL
AG Ratio: 1.3 (calc) (ref 1.0–2.5)
ALT: 19 U/L (ref 9–46)
AST: 24 U/L (ref 10–35)
Albumin: 4.3 g/dL (ref 3.6–5.1)
Alkaline phosphatase (APISO): 77 U/L (ref 35–144)
BUN/Creatinine Ratio: 13 (calc) (ref 6–22)
BUN: 18 mg/dL (ref 7–25)
CO2: 28 mmol/L (ref 20–32)
Calcium: 9.4 mg/dL (ref 8.6–10.3)
Chloride: 102 mmol/L (ref 98–110)
Creat: 1.42 mg/dL — ABNORMAL HIGH (ref 0.70–1.33)
Globulin: 3.3 g/dL (calc) (ref 1.9–3.7)
Glucose, Bld: 128 mg/dL — ABNORMAL HIGH (ref 65–99)
Potassium: 3.3 mmol/L — ABNORMAL LOW (ref 3.5–5.3)
Sodium: 143 mmol/L (ref 135–146)
Total Bilirubin: 0.5 mg/dL (ref 0.2–1.2)
Total Protein: 7.6 g/dL (ref 6.1–8.1)

## 2019-01-10 LAB — LIPID PANEL
Cholesterol: 152 mg/dL (ref <200)
HDL: 45 mg/dL (ref >=40)
LDL Cholesterol (Calc): 82 mg/dL (calc)
Non-HDL Cholesterol (Calc): 107 mg/dL (calc) (ref <130)
Total CHOL/HDL Ratio: 3.4 (calc) (ref <5.0)
Triglycerides: 153 mg/dL — ABNORMAL HIGH (ref <150)

## 2019-01-14 ENCOUNTER — Encounter: Payer: Self-pay | Admitting: *Deleted

## 2019-01-29 MED FILL — HYDROCHLOROTHIAZIDE 12.5 MG: 12.5 | 30 days supply | Qty: 30 | Fill #0

## 2019-01-29 MED FILL — POTASSIUM CHLORIDE CRYS ER: 20 | 30 days supply | Qty: 30 | Fill #0

## 2019-02-25 ENCOUNTER — Telehealth: Payer: Self-pay | Admitting: Pulmonary Disease

## 2019-02-25 DIAGNOSIS — G4733 Obstructive sleep apnea (adult) (pediatric): Secondary | ICD-10-CM

## 2019-02-25 NOTE — Telephone Encounter (Signed)
Per 12/11/17- cpap was ordered through [facility].  No [facility] DME order. Patient last OV was with TP, 11/29/2017, new machine ordered.  Patient stated he used [facility]) for cpap.  Patient stated his cpap is working, but feels that settings are not correct.  Feels that he slept better when settings were increased.   Last cpap order settings auto 10-16cm  11/29/17 OV - Instructions      Return in about 1 year (around 11/29/2018).  Change Auto set CPAP to 10 to 16 cm  Order for new machine  Download in 1 month .  Wear CPAP each night  Work on healthy weight  Do not drive if sleepy  follow up in 1 year with Dr. Elsworth Soho  And As needed       Message routed to [facility], NP to advise 02/26/19

## 2019-02-27 NOTE — Telephone Encounter (Signed)
Why did he never receive his new CPAP that was sent to [place] in 11/2017? Can we get a current download for Tammy to review when she returns to the office? Is the patient aware that she is not back in the office until Tuesday 5.26.2020?

## 2019-02-27 NOTE — Telephone Encounter (Signed)
Checked [facility] to see what the compliance report showed and the report from [place] for the 30-day report is from 10/30/17-11/28/17.  Called and spoke with pt to confirm that he never received a new cpap machine and pt stated that was correct as at the time when he was going to be receiving a new machine, the price they were stating to him he could not afford at that time due to his job. Pt stated to me that he is not with [facility] that he used to be with [facility] which is now [place]. Pt is aware that TP is not back in the office until 03/03/2019.

## 2019-03-03 NOTE — Telephone Encounter (Signed)
Patient is checking the status of his new CPAP machine.  Patient phone number is [phone].

## 2019-03-03 NOTE — Telephone Encounter (Signed)
Pt is returning phone call [phone] [facility]

## 2019-03-03 NOTE — Telephone Encounter (Signed)
Call returned to patient, inquiring about status of his cpap machine. I informed him we are having trouble obtaining a compliance report. I explained that even if he is eligible for a new machine if he is not complaint insurance can refuse. He explained that the pressures are too high, he states that tries to wear it despite his discomfort and he did confirm that he is using it every night. Will await call back from [place] with [facility] in regards to the compliance report.

## 2019-03-03 NOTE — Telephone Encounter (Signed)
Call returned to [facility] with [facility], she states he did not get his current cpap machine from [facility]. They are unable to add him to [facility] because they did not set him up. He is now in collections with [facility] which he will have to make arrangements for.   Call made to patient, made him aware of  the above

## 2019-03-03 NOTE — Telephone Encounter (Signed)
He states he received the original unit from [place]. Made him aware because it is 530 we will have to address in the AM. Will call [facility] in the AM for compliance report and see if he is eligible for new cpap machine. Voiced understanding.

## 2019-03-03 NOTE — Telephone Encounter (Signed)
When [place] call backs please find out if patient is eligible for a new cpap machine.

## 2019-03-03 NOTE — Telephone Encounter (Signed)
We still need a download - pt does have an account in [place] but the last monitoring date is Feb 2019 even though it says there is no monitoring end date [facility] and spoke with Dimas Chyle to inquire about this and see if a download can be obtained - she will call me back.

## 2019-03-04 NOTE — Telephone Encounter (Signed)
Spoke with patient. He is aware that the order has been placed. Order has been placed.   Nothing further needed at time of call.

## 2019-03-04 NOTE — Telephone Encounter (Signed)
Called and spoke with Patient, who stated he got his cpap from [facility].  Called and spoke with Bethanne Ginger, at [facility].  Bethanne Ginger stated order was received for new cpap 02/2019, but Patient stated it was to expensive, and he turned down his new cpap.  Bethanne Ginger stated he is using the original cpap machine.  Gilda uploaded him to air view and stated nothing was showing, like he wasn't using it. Called Patient to make sure he is using his cpap. Patient stated he using his cpap machine nightly, but he feels like he the settings need to be increased.  Message routed to [place], NP to advise on cpap settings

## 2019-03-04 NOTE — Telephone Encounter (Signed)
Per TP: okay to try getting him a new machine even though we don't have a download.  Settings auto 5-15cm, download sent in 4 weeks please.  Thanks.

## 2019-03-04 NOTE — Telephone Encounter (Signed)
ATC [facility], they are not open yet. Will route message to triage for follow up.

## 2019-03-05 ENCOUNTER — Ambulatory Visit (INDEPENDENT_AMBULATORY_CARE_PROVIDER_SITE_OTHER): Payer: BC Managed Care – PPO | Admitting: Pulmonary Disease

## 2019-03-05 ENCOUNTER — Telehealth: Payer: Self-pay | Admitting: Pulmonary Disease

## 2019-03-05 ENCOUNTER — Encounter: Payer: Self-pay | Admitting: Adult Health

## 2019-03-05 ENCOUNTER — Other Ambulatory Visit: Payer: Self-pay

## 2019-03-05 VITALS — BP 124/74 | HR 70 | Temp 98.6°F | Ht 73.0 in | Wt 233.2 lb

## 2019-03-05 DIAGNOSIS — G4733 Obstructive sleep apnea (adult) (pediatric): Secondary | ICD-10-CM

## 2019-03-05 NOTE — Telephone Encounter (Signed)
Returned call to patient and notified order was sent to [facility]. He would be contacted by [facility] to discuss financial portion of cpap after insurance has approved equipment. Nothing further needed.

## 2019-03-05 NOTE — Progress Notes (Signed)
@Patient  ID: Bobby Neer., male    DOB: Mar 29, 1966, 53 y.o.   MRN: [id number]  Chief Complaint  Patient presents with  . Follow-up    OSA    Referring provider: Ann Held, *  HPI:  53 year old never smoker followed in our office for severe osa   Smoker/ Smoking History: Never Smoker  Maintenance:  none Pt of: Dr. Elsworth Soho  03/05/2019  - Visit   53 year old male never smoker presenting to our office today for a follow-up to review his obstructive sleep apnea.  Patient has a machine that is 54 years old and he is looking to get a new CPAP today.  His DME company is requiring that he complete a face-to-face office visit in order to get a new CPAP.  CPAP compliance report today shows excellent compliance.  See compliance report listed below:  02/03/2019-03/04/2019 20-30 in the last 30 days use, 25 those days greater than 4 hours, average usage 4 hours and 33 minutes, CPAP set pressure of 16, AHI 3    Tests:   11/29/2011 - HST -AHI 40.1 an hour, SaO2 low 77%  FENO:  No results found for: NITRICOXIDE  PFT: No flowsheet data found.  Imaging: No results found.    Specialty Problems      Pulmonary Problems   SINUSITIS - ACUTE-NOS    Qualifier: Diagnosis of  By: Jerold Coombe        OSA (obstructive sleep apnea)    HST 11/2011:  AHI 40/hr with desat to 77% Auto 2013:  Optimal pressure 16cm      Allergic rhinitis      Allergies  Allergen Reactions  . Ace Inhibitors Other (See Comments)    angioedema    Immunization History  Administered Date(s) Administered  . Influenza Split 10/22/2011  . Influenza Whole 07/05/2008  . Influenza,inj,Quad PF,6+ Mos 07/19/2015, 12/21/2016, 06/24/2017, 07/01/2018  . Td 10/27/2004  . Tdap 07/19/2015    Past Medical History:  Diagnosis Date  . Bell's palsy    History  . Hyperlipidemia   . Hypertension   . Sleep apnea    uses CPAP nightly    Tobacco History: Social History   Tobacco Use  Smoking  Status Never Smoker  Smokeless Tobacco Never Used   Counseling given: Not Answered   Continue to not smoke  Outpatient Encounter Medications as of 03/05/2019  Medication Sig  . amLODipine (NORVASC) 10 MG tablet TAKE 1 TABLET (10 MG TOTAL) BY MOUTH DAILY.  Marland Kitchen atorvastatin (LIPITOR) 20 MG tablet TAKE 1 TABLET (20 MG TOTAL) BY MOUTH AT BEDTIME.  . fluticasone (FLONASE) 50 MCG/ACT nasal spray Place 2 sprays into both nostrils daily.  Marland Kitchen loratadine (CLARITIN) 10 MG tablet Take 1 tablet (10 mg total) by mouth daily.  . metoprolol succinate (TOPROL-XL) 25 MG 24 hr tablet Take 1 tablet (25 mg total) by mouth daily.  . methocarbamol (ROBAXIN) 500 MG tablet Take 1 tablet (500 mg total) by mouth every 8 (eight) hours as needed. (Patient not taking: Reported on 03/05/2019)   Facility-Administered Encounter Medications as of 03/05/2019  Medication  . 0.9 %  sodium chloride infusion     Review of Systems  Review of Systems  Constitutional: Negative for activity change, chills, fatigue, fever and unexpected weight change.  HENT: Negative for postnasal drip, rhinorrhea, sneezing and sore throat.   Eyes: Negative.   Respiratory: Negative for cough, shortness of breath and wheezing.   Cardiovascular: Negative for  chest pain and palpitations.  Gastrointestinal: Negative for diarrhea, nausea and vomiting.  Endocrine: Negative.   Musculoskeletal: Negative.   Skin: Negative.   Neurological: Negative for dizziness and headaches.  Psychiatric/Behavioral: Negative.  Negative for dysphoric mood. The patient is not nervous/anxious.   All other systems reviewed and are negative.    Physical Exam  BP 124/74 (BP Location: Left Arm, Cuff Size: Normal)   Pulse 70   Temp 98.6 F (37 C) (Oral)   Ht 6\' 1"  (1.854 m)   Wt 233 lb 3.2 oz (105.8 kg)   SpO2 99%   BMI 30.77 kg/m   Wt Readings from Last 5 Encounters:  03/05/19 233 lb 3.2 oz (105.8 kg)  07/08/18 230 lb (104.3 kg)  07/01/18 230 lb 6.4 oz (104.5  kg)  03/08/18 229 lb (103.9 kg)  03/08/18 229 lb 4 oz (104 kg)     Physical Exam  Constitutional: He is oriented to person, place, and time and well-developed, well-nourished, and in no distress. No distress.  HENT:  Head: Normocephalic and atraumatic.  Right Ear: Hearing, tympanic membrane, external ear and ear canal normal.  Left Ear: Hearing, tympanic membrane, external ear and ear canal normal.  Mouth/Throat: Uvula is midline and oropharynx is clear and moist. No oropharyngeal exudate.  Mallampati 4  Eyes: Pupils are equal, round, and reactive to light.  Neck: Normal range of motion. Neck supple.  Cardiovascular: Normal rate, regular rhythm and normal heart sounds.  Pulmonary/Chest: Effort normal and breath sounds normal. No accessory muscle usage. No respiratory distress. He has no decreased breath sounds. He has no wheezes. He has no rhonchi. He has no rales.  Abdominal: Soft. Bowel sounds are normal. There is no abdominal tenderness.  Musculoskeletal: Normal range of motion.        General: No edema.  Lymphadenopathy:    He has no cervical adenopathy.  Neurological: He is alert and oriented to person, place, and time. Gait normal.  Skin: Skin is warm and dry. He is not diaphoretic. No erythema.  Psychiatric: Mood, memory, affect and judgment normal.  Nursing note and vitals reviewed.     Lab Results:  CBC    Component Value Date/Time   WBC 4.0 07/08/2018 1636   RBC 4.41 07/08/2018 1636   HGB 12.8 (L) 07/08/2018 1636   HCT 38.0 (L) 07/08/2018 1636   PLT 229.0 07/08/2018 1636   MCV 86.1 07/08/2018 1636   MCH 29.5 12/21/2016 1615   MCHC 33.7 07/08/2018 1636   RDW 13.3 07/08/2018 1636   LYMPHSABS 2.2 07/08/2018 1636   MONOABS 0.3 07/08/2018 1636   EOSABS 0.2 07/08/2018 1636   BASOSABS 0.0 07/08/2018 1636    BMET    Component Value Date/Time   NA 143 01/09/2019 1509   K 3.3 (L) 01/09/2019 1509   CL 102 01/09/2019 1509   CO2 28 01/09/2019 1509   GLUCOSE 128  (H) 01/09/2019 1509   GLUCOSE 90 08/15/2006 1121   BUN 18 01/09/2019 1509   CREATININE 1.42 (H) 01/09/2019 1509   CALCIUM 9.4 01/09/2019 1509   GFRNONAA 72 (L) 09/04/2014 1121   GFRAA 84 (L) 09/04/2014 1121    BNP No results found for: BNP  ProBNP No results found for: PROBNP    Assessment & Plan:   OSA (obstructive sleep apnea) Assessment: Severe obstructive sleep apnea based off of home sleep study in 2013 CPAP compliance report today shows excellent compliance Patient would like new CPAP  Plan: New CPAP order today Same pressure  settings 59-month compliance check Follow-up in 1 year    Return in about 1 year (around 03/04/2020), or if symptoms worsen or fail to improve, for Follow up with Dr. Elsworth Soho, Follow up with Wyn Quaker FNP-C.   Lauraine Rinne, NP 03/05/2019   This appointment was 16 minutes long with over 50% of the time in direct face-to-face patient care, assessment, plan of care, and follow-up.

## 2019-03-05 NOTE — Assessment & Plan Note (Signed)
Assessment: Severe obstructive sleep apnea based off of home sleep study in 2013 CPAP compliance report today shows excellent compliance Patient would like new CPAP  Plan: New CPAP order today Same pressure settings 96-month compliance check Follow-up in 1 year

## 2019-03-05 NOTE — Telephone Encounter (Signed)
Spoke with patient. He has been scheduled for an OV today at 4pm.

## 2019-03-05 NOTE — Patient Instructions (Addendum)
New CPAP today  DME: [employer]  Mask of choice  Supplies for year  2 month download    We recommend that you continue using your CPAP daily >>>Keep up the hard work using your device >>> Goal should be wearing this for the entire night that you are sleeping, at least 4 to 6 hours  Remember:  . Do not drive or operate heavy machinery if tired or drowsy.  . Please notify the supply company and office if you are unable to use your device regularly due to missing supplies or machine being broken.  . Work on maintaining a healthy weight and following your recommended nutrition plan  . Maintain proper daily exercise and movement  . Maintaining proper use of your device can also help improve management of other chronic illnesses such as: Blood pressure, blood sugars, and weight management.   BiPAP/ CPAP Cleaning:  >>>Clean weekly, with Dawn soap, and bottle brush.  Set up to air dry.   Return in about 1 year (around 03/04/2020), or if symptoms worsen or fail to improve, for Follow up with Dr. Elsworth Soho, Follow up with Wyn Quaker FNP-C.   Coronavirus (COVID-19) Are you at risk?  Are you at risk for the Coronavirus (COVID-19)?  To be considered HIGH RISK for Coronavirus (COVID-19), you have to meet the following criteria:  . Traveled to [place], [place], [place], [place] or [place]; or in the [place] to [place], [place], [place], or [place]; and have fever, cough, and shortness of breath within the last 2 weeks of travel OR . Been in close contact with a person diagnosed with COVID-19 within the last 2 weeks and have fever, cough, and shortness of breath . IF YOU DO NOT MEET THESE CRITERIA, YOU ARE CONSIDERED LOW RISK FOR COVID-19.  What to do if you are HIGH RISK for COVID-19?  Marland Kitchen If you are having a medical emergency, call 911. . Seek medical care right away. Before you go to a doctor's office, urgent care or emergency department, call ahead and tell them about your recent travel,  contact with someone diagnosed with COVID-19, and your symptoms. You should receive instructions from your physician's office regarding next steps of care.  . When you arrive at healthcare provider, tell the healthcare staff immediately you have returned from visiting [place], [place], [place], [place] or [place]; or traveled in the [place] to [place], [place], [place], or [place]; in the last two weeks or you have been in close contact with a person diagnosed with COVID-19 in the last 2 weeks.   . Tell the health care staff about your symptoms: fever, cough and shortness of breath. . After you have been seen by a medical provider, you will be either: o Tested for (COVID-19) and discharged home on quarantine except to seek medical care if symptoms worsen, and asked to  - Stay home and avoid contact with others until you get your results (4-5 days)  - Avoid travel on public transportation if possible (such as bus, train, or airplane) or o Sent to the Emergency Department by EMS for evaluation, COVID-19 testing, and possible admission depending on your condition and test results.  What to do if you are LOW RISK for COVID-19?  Reduce your risk of any infection by using the same precautions used for avoiding the common cold or flu:  Marland Kitchen Wash your hands often with soap and warm water for at least 20 seconds.  If soap and  water are not readily available, use an alcohol-based hand sanitizer with at least 60% alcohol.  . If coughing or sneezing, cover your mouth and nose by coughing or sneezing into the elbow areas of your shirt or coat, into a tissue or into your sleeve (not your hands). . Avoid shaking hands with others and consider head nods or verbal greetings only. . Avoid touching your eyes, nose, or mouth with unwashed hands.  . Avoid close contact with people who are sick. . Avoid places or events with large numbers of people in one location, like concerts or sporting events. . Carefully  consider travel plans you have or are making. . If you are planning any travel outside or inside the [place], visit the CDC's Travelers' Health webpage for the latest health notices. . If you have some symptoms but not all symptoms, continue to monitor at home and seek medical attention if your symptoms worsen. . If you are having a medical emergency, call 911.   [place] / e-Visit: [email or web address]         [facility]: [facility]: [phone]                   [facility]: [phone]           It is flu season:   >>> Best ways to protect herself from the flu: Receive the yearly flu vaccine, practice good hand hygiene washing with soap and also using hand sanitizer when available, eat a nutritious meals, get adequate rest, hydrate appropriately   Please contact the office if your symptoms worsen or you have concerns that you are not improving.   Thank you for choosing [facility] for your healthcare, and for allowing [place] to partner with you on your healthcare journey. I am thankful to be able to provide care to you today.   Wyn Quaker FNP-C    Living With Sleep Apnea Sleep apnea is a condition in which breathing pauses or becomes shallow during sleep. Sleep apnea is most commonly caused by a collapsed or blocked airway. People with sleep apnea snore loudly and have times when they gasp and stop breathing for 10 seconds or more during sleep. This happens over and over during the night. This disrupts your sleep and keeps your body from getting the rest that it needs, which can cause tiredness and lack of energy (fatigue) during the day. The breaks in breathing also interrupt the deep sleep that you need to feel rested. Even if you do not completely wake up from the gaps in breathing, your sleep may not be restful. You  may also have a headache in the morning and low energy during the day, and you may feel anxious or depressed. How can sleep apnea affect me? Sleep apnea increases your chances of extreme tiredness during the day (daytime fatigue). It can also increase your risk for health conditions, such as:  Heart attack.  Stroke.  Diabetes.  Heart failure.  Irregular heartbeat.  High blood pressure. If you have daytime fatigue as a result of sleep apnea, you may be more likely to:  Perform poorly at school or work.  Fall asleep while driving.  Have difficulty with attention.  Develop depression or anxiety.  Become severely overweight (obese).  Have sexual dysfunction. What actions can I take to manage sleep apnea? Sleep apnea treatment   If you were given a device  to open your airway while you sleep, use it only as told by your health care provider. You may be given: ? An oral appliance. This is a custom-made mouthpiece that shifts your lower jaw forward. ? A continuous positive airway pressure (CPAP) device. This device blows air through a mask when you breathe out (exhale). ? A nasal expiratory positive airway pressure (EPAP) device. This device has valves that you put into each nostril. ? A bi-level positive airway pressure (BPAP) device. This device blows air through a mask when you breathe in (inhale) and breathe out (exhale).  You may need surgery if other treatments do not work for you. Sleep habits  Go to sleep and wake up at the same time every day. This helps set your internal clock (circadian rhythm) for sleeping. ? If you stay up later than usual, such as on weekends, try to get up in the morning within 2 hours of your normal wake time.  Try to get at least 7-9 hours of sleep each night.  Stop computer, tablet, and mobile phone use a few hours before bedtime.  Do not take long naps during the day. If you nap, limit it to 30 minutes.  Have a relaxing bedtime routine.  Reading or listening to music may relax you and help you sleep.  Use your bedroom only for sleep. ? Keep your television and computer out of your bedroom. ? Keep your bedroom cool, dark, and quiet. ? Use a supportive mattress and pillows.  Follow your health care provider's instructions for other changes to sleep habits. Nutrition  Do not eat heavy meals in the evening.  Do not have caffeine in the later part of the day. The effects of caffeine can last for more than 5 hours.  Follow your health care provider's or dietitian's instructions for any diet changes. Lifestyle      Do not drink alcohol before bedtime. Alcohol can cause you to fall asleep at first, but then it can cause you to wake up in the middle of the night and have trouble getting back to sleep.  Do not use any products that contain nicotine or tobacco, such as cigarettes and e-cigarettes. If you need help quitting, ask your health care provider. Medicines  Take over-the-counter and prescription medicines only as told by your health care provider.  Do not use over-the-counter sleep medicine. You can become dependent on this medicine, and it can make sleep apnea worse.  Do not use medicines, such as sedatives and narcotics, unless told by your health care provider. Activity  Exercise on most days, but avoid exercising in the evening. Exercising near bedtime can interfere with sleeping.  If possible, spend time outside every day. Natural light helps regulate your circadian rhythm. General information  Lose weight if you need to, and maintain a healthy weight.  Keep all follow-up visits as told by your health care provider. This is important.  If you are having surgery, make sure to tell your health care provider that you have sleep apnea. You may need to bring your device with you. Where to find more information Learn more about sleep apnea and daytime fatigue from:  American Sleep Association:  sleepassociation.[place]: sleepfoundation.org  National Heart, Lung, and Blood Institute: https://www.hartman-hill.biz/ Summary  Sleep apnea can cause daytime fatigue and other serious health conditions.  Both sleep apnea and daytime fatigue can be bad for your health and well-being.  You may need to wear a device while sleeping  to help keep your airway open.  If you are having surgery, make sure to tell your health care provider that you have sleep apnea. You may need to bring your device with you.  Making changes to sleep habits, diet, lifestyle, and activity can help you manage sleep apnea. This information is not intended to replace advice given to you by your health care provider. Make sure you discuss any questions you have with your health care provider. Document Released: 12/19/2017 Document Revised: 05/27/2018 Document Reviewed: 12/19/2017 Elsevier Interactive Patient Education  [employer].

## 2019-03-18 MED FILL — HYDROCHLOROTHIAZIDE 12.5 MG: 12.5 | 30 days supply | Qty: 30 | Fill #0

## 2019-03-25 ENCOUNTER — Encounter: Payer: Self-pay | Admitting: Internal Medicine

## 2019-03-31 ENCOUNTER — Other Ambulatory Visit: Payer: Self-pay | Admitting: Family Medicine

## 2019-04-01 MED FILL — ATORVASTATIN 20 MG TABLET: 20 | 90 days supply | Qty: 90 | Fill #0

## 2019-04-01 MED FILL — AMLODIPINE BESYLATE 10 MG T: 10 | 90 days supply | Qty: 90 | Fill #0

## 2019-04-14 ENCOUNTER — Ambulatory Visit: Payer: BC Managed Care – PPO | Admitting: Family Medicine

## 2019-04-16 ENCOUNTER — Encounter: Payer: BC Managed Care – PPO | Admitting: Internal Medicine

## 2019-04-20 ENCOUNTER — Encounter: Payer: Self-pay | Admitting: Family Medicine

## 2019-04-20 ENCOUNTER — Ambulatory Visit (INDEPENDENT_AMBULATORY_CARE_PROVIDER_SITE_OTHER): Payer: BC Managed Care – PPO | Admitting: Family Medicine

## 2019-04-20 ENCOUNTER — Other Ambulatory Visit: Payer: Self-pay

## 2019-04-20 DIAGNOSIS — E785 Hyperlipidemia, unspecified: Secondary | ICD-10-CM

## 2019-04-20 DIAGNOSIS — I10 Essential (primary) hypertension: Secondary | ICD-10-CM

## 2019-04-20 MED ORDER — METOPROLOL SUCCINATE ER 25 MG PO TB24: 25.0000 mg | ORAL_TABLET | Freq: Every day | ORAL | 3 refills | Status: DC

## 2019-04-20 MED FILL — METOPROLOL SUCCINATE ER 25: 25 | 90 days supply | Qty: 90 | Fill #0

## 2019-04-20 NOTE — Assessment & Plan Note (Signed)
Well controlled, no changes to meds. Encouraged heart healthy diet such as the DASH diet and exercise as tolerated.  °

## 2019-04-20 NOTE — Assessment & Plan Note (Signed)
Tolerating statin, encouraged heart healthy diet, avoid trans fats, minimize simple carbs and saturated fats. Increase exercise as tolerated 

## 2019-04-20 NOTE — Progress Notes (Deleted)
Patient ID: Bobby Neer., male    DOB: 03-16-1966  Age: 53 y.o. MRN: [id number]    Subjective:  Subjective  HPI Bobby Jesus. presents for f/u bp and cholesterol   No complaints   Review of Systems  Constitutional: Negative for chills and fever.  HENT: Negative for congestion and hearing loss.   Eyes: Negative for discharge.  Respiratory: Negative for cough and shortness of breath.   Cardiovascular: Negative for chest pain, palpitations and leg swelling.  Gastrointestinal: Negative for abdominal pain, blood in stool, constipation, diarrhea, nausea and vomiting.  Genitourinary: Negative for dysuria, frequency, hematuria and urgency.  Musculoskeletal: Negative for back pain and myalgias.  Skin: Negative for rash.  Allergic/Immunologic: Negative for environmental allergies.  Neurological: Negative for dizziness, weakness and headaches.  Hematological: Does not bruise/bleed easily.  Psychiatric/Behavioral: Negative for suicidal ideas. The patient is not nervous/anxious.     History Past Medical History:  Diagnosis Date   Bell's palsy    History   Hyperlipidemia    Hypertension    Sleep apnea    uses CPAP nightly    He has a past surgical history that includes Knee surgery; Adenoidectomy (08/2011); and Closed reduction hand fracture (12/2010).   His family history includes Alzheimer's disease in his maternal grandmother; Cancer in his father; Cancer (age of onset: 45) in his maternal aunt; Cancer (age of onset: 67) in his mother; Colon cancer in his father; Heart disease in his maternal grandfather and maternal uncle; Hyperlipidemia in his father and maternal grandfather; Hypertension in his father, maternal grandfather, maternal grandmother, maternal uncle, maternal uncle, mother, sister, and unknown relative; Stroke in his father, maternal grandfather, and maternal uncle.He reports that he has never smoked. He has never used smokeless tobacco. He reports that he does  not drink alcohol or use drugs.  Current Outpatient Medications on File Prior to Visit  Medication Sig Dispense Refill   amLODipine (NORVASC) 10 MG tablet TAKE 1 TABLET (10 MG TOTAL) BY MOUTH DAILY. 90 tablet 0   atorvastatin (LIPITOR) 20 MG tablet TAKE 1 TABLET (20 MG TOTAL) BY MOUTH AT BEDTIME. 90 tablet 0   fluticasone (FLONASE) 50 MCG/ACT nasal spray Place 2 sprays into both nostrils daily. 16 g 6   loratadine (CLARITIN) 10 MG tablet Take 1 tablet (10 mg total) by mouth daily. 30 tablet 11   methocarbamol (ROBAXIN) 500 MG tablet Take 1 tablet (500 mg total) by mouth every 8 (eight) hours as needed. (Patient not taking: Reported on 03/05/2019) 30 tablet 0   Current Facility-Administered Medications on File Prior to Visit  Medication Dose Route Frequency Provider Last Rate Last Dose   0.9 %  sodium chloride infusion  500 mL Intravenous Continuous Irene Shipper, MD         Objective:  Objective  Physical Exam Vitals signs and nursing note reviewed.  Constitutional:      General: He is sleeping.     Appearance: He is well-developed.  HENT:     Head: Normocephalic and atraumatic.  Eyes:     Pupils: Pupils are equal, round, and reactive to light.  Neck:     Musculoskeletal: Normal range of motion and neck supple.     Thyroid: No thyromegaly.  Cardiovascular:     Rate and Rhythm: Normal rate and regular rhythm.     Heart sounds: No murmur.  Pulmonary:     Effort: Pulmonary effort is normal. No respiratory distress.     Breath sounds: Normal breath sounds.  No wheezing or rales.  Chest:     Chest wall: No tenderness.  Musculoskeletal:        General: No tenderness.  Skin:    General: Skin is warm and dry.  Neurological:     Mental Status: He is oriented to person, place, and time.  Psychiatric:        Behavior: Behavior normal.        Thought Content: Thought content normal.        Judgment: Judgment normal.    There were no vitals taken for this visit. Wt Readings  from Last 3 Encounters:  03/05/19 233 lb 3.2 oz (105.8 kg)  07/08/18 230 lb (104.3 kg)  07/01/18 230 lb 6.4 oz (104.5 kg)  138/85   Pt is in NAD    Lab Results  Component Value Date   WBC 4.0 07/08/2018   HGB 12.8 (L) 07/08/2018   HCT 38.0 (L) 07/08/2018   PLT 229.0 07/08/2018   GLUCOSE 128 (H) 01/09/2019   CHOL 152 01/09/2019   TRIG 153 (H) 01/09/2019   HDL 45 01/09/2019   LDLCALC 82 01/09/2019   ALT 19 01/09/2019   AST 24 01/09/2019   NA 143 01/09/2019   K 3.3 (L) 01/09/2019   CL 102 01/09/2019   CREATININE 1.42 (H) 01/09/2019   BUN 18 01/09/2019   CO2 28 01/09/2019   TSH 1.56 07/08/2018   PSA 2.28 07/08/2018   MICROALBUR 2.2 (H) 02/17/2013    No results found.   Assessment & Plan:  Plan  I am having Bobby Neer. maintain his loratadine, fluticasone, methocarbamol, atorvastatin, amLODipine, and metoprolol succinate. We will continue to administer sodium chloride.  Meds ordered this encounter  Medications   metoprolol succinate (TOPROL-XL) 25 MG 24 hr tablet    Sig: Take 1 tablet (25 mg total) by mouth daily.    Dispense:  90 tablet    Refill:  3    Problem List Items Addressed This Visit      Unprioritized   Essential hypertension - Primary   Relevant Medications   metoprolol succinate (TOPROL-XL) 25 MG 24 hr tablet   Other Relevant Orders   Lipid panel   Comprehensive metabolic panel   Hyperlipidemia   Relevant Medications   metoprolol succinate (TOPROL-XL) 25 MG 24 hr tablet   Other Relevant Orders   Lipid panel   Comprehensive metabolic panel      Follow-up: Return in about 6 months (around 10/21/2019), or if symptoms worsen or fail to improve, for annual exam, fasting.  Ann Held, DO

## 2019-04-23 ENCOUNTER — Telehealth: Payer: Self-pay | Admitting: Pulmonary Disease

## 2019-04-23 DIAGNOSIS — G4733 Obstructive sleep apnea (adult) (pediatric): Secondary | ICD-10-CM

## 2019-04-23 NOTE — Telephone Encounter (Signed)
Called and spoke with patient. Let him know of Tonya's recommendations and that orders were being sent in. I told him to try this new pressure out for a week or so then call us and let us know how it was feeling compared to before.   Patient voiced understanding Order placed Nothing further needed at this time.

## 2019-04-23 NOTE — Telephone Encounter (Signed)
Please order heated tubing per patient request. His new machine is set correctly per download, but he feels the pressure is too high we could adjust it. Please order Set pressure at 14 cm H20. Please tell him to give [place] an update on comfort level with new setting. Thanks.

## 2019-04-23 NOTE — Telephone Encounter (Signed)
Called and spoke with pt. Pt stated he received a new cpap machine yesterday, 7/15 and is requesting an Rx for heated tubing to be sent to [facility] for him as he had heated tubing with his old machine and would like to have it with his new machine as well.  Also while speaking with pt, pt stated he believes the pressure is too high. I have printed a download of pt's info we have from his new machine and will give it to [facility] to review. Tonya, please advise on this for pt. Thanks!

## 2019-05-01 ENCOUNTER — Other Ambulatory Visit: Payer: BC Managed Care – PPO

## 2019-05-04 ENCOUNTER — Other Ambulatory Visit: Payer: Self-pay

## 2019-05-04 ENCOUNTER — Other Ambulatory Visit: Payer: Self-pay | Admitting: Family Medicine

## 2019-05-04 ENCOUNTER — Other Ambulatory Visit (INDEPENDENT_AMBULATORY_CARE_PROVIDER_SITE_OTHER): Payer: BC Managed Care – PPO

## 2019-05-04 DIAGNOSIS — I10 Essential (primary) hypertension: Secondary | ICD-10-CM | POA: Diagnosis not present

## 2019-05-04 DIAGNOSIS — E785 Hyperlipidemia, unspecified: Secondary | ICD-10-CM

## 2019-05-04 LAB — COMPREHENSIVE METABOLIC PANEL
ALT: 18 U/L (ref 0–53)
AST: 21 U/L (ref 0–37)
Albumin: 4.3 g/dL (ref 3.5–5.2)
Alkaline Phosphatase: 75 U/L (ref 39–117)
BUN: 16 mg/dL (ref 6–23)
CO2: 27 mEq/L (ref 19–32)
Calcium: 9.7 mg/dL (ref 8.4–10.5)
Chloride: 105 mEq/L (ref 96–112)
Creatinine, Ser: 1.44 mg/dL (ref 0.40–1.50)
GFR: 61.76 mL/min (ref >60.00)
Glucose, Bld: 95 mg/dL (ref 70–99)
Potassium: 3.8 mEq/L (ref 3.5–5.1)
Sodium: 140 mEq/L (ref 135–145)
Total Bilirubin: 0.5 mg/dL (ref 0.2–1.2)
Total Protein: 7.6 g/dL (ref 6.0–8.3)

## 2019-05-04 LAB — LIPID PANEL
Cholesterol: 131 mg/dL (ref 0–200)
HDL: 38.6 mg/dL — ABNORMAL LOW (ref >39.00)
LDL Cholesterol: 77 mg/dL (ref 0–99)
NonHDL: 92.03
Total CHOL/HDL Ratio: 3
Triglycerides: 77 mg/dL (ref 0.0–149.0)
VLDL: 15.4 mg/dL (ref 0.0–40.0)

## 2019-05-05 ENCOUNTER — Other Ambulatory Visit: Payer: Self-pay

## 2019-05-05 ENCOUNTER — Ambulatory Visit: Payer: BC Managed Care – PPO | Admitting: *Deleted

## 2019-05-05 VITALS — Ht 73.0 in | Wt 230.0 lb

## 2019-05-05 DIAGNOSIS — Z800 Family history of malignant neoplasm of digestive organs: Secondary | ICD-10-CM

## 2019-05-05 DIAGNOSIS — Z860100 Personal history of colon polyps, unspecified: Secondary | ICD-10-CM

## 2019-05-05 MED ORDER — SUPREP BOWEL PREP KIT 17.5-3.13-1.6 GM/177ML PO SOLN: 1.0000 | Freq: Once | ORAL | 0 refills | Status: AC

## 2019-05-05 MED FILL — SUPREP BOWEL PREP KIT: 17.5-3.13-1 | 1 days supply | Qty: 354 | Fill #0

## 2019-05-05 NOTE — Progress Notes (Signed)
Pt states he had pain with the last colon in the rectal area x 1 yr-   No egg or soy allergy known to patient  No issues with past sedation with any surgeries  or procedures, no intubation problems  No diet pills per patient No home 02 use per patient  No blood thinners per patient  Pt denies issues with constipation  No A fib or A flutter  EMMI video sent to pt's e mail   Pt verified name, DOB, address and insurance during PV today. Pt mailed instruction packet to included paper to complete and mail back to [facility] with addressed and stamped envelope, Emmi video, copy of consent form to read and not return, and instructions. Suprep $15  coupon mailed in packet. PV completed over the phone. Pt encouraged to call with questions or issues   Pt is aware that care partner will wait in the car during procedure; if they feel like they will be too hot to wait in the car; they may wait in the lobby.  We want them to wear a mask (we do not have any that we can provide them), practice social distancing, and we will check their temperatures when they get here.  I did remind patient that their care partner needs to stay in the parking lot the entire time. Pt will wear mask into building.

## 2019-05-06 NOTE — Progress Notes (Signed)
Virtual Visit via Video Note  I connected with Bobby Castaneda. on 05/06/19 at  4:00 PM EDT by a video enabled telemedicine application and verified that I am speaking with the correct person using two identifiers.  Location: Patient: home  Provider: office    I discussed the limitations of evaluation and management by telemedicine and the availability of in person appointments. The patient expressed understanding and agreed to proceed.  History of Present Illness: Pt is home ---- needs refills and f/u bp, chol   No complaints.     Observations/Objective: 135/85  No other vitals obtained Pt in NAD  Assessment and Plan: 1. Essential hypertension Well controlled, no changes to meds. Encouraged heart healthy diet such as the DASH diet and exercise as tolerated.  - Lipid panel; Future - Comprehensive metabolic panel; Future - metoprolol succinate (TOPROL-XL) 25 MG 24 hr tablet; Take 1 tablet (25 mg total) by mouth daily.  Dispense: 90 tablet; Refill: 3  2. Hyperlipidemia, unspecified hyperlipidemia type Tolerating statin, encouraged heart healthy diet, avoid trans fats, minimize simple carbs and saturated fats. Increase exercise as tolerated - Lipid panel; Future - Comprehensive metabolic panel; Future  Follow Up Instructions:    I discussed the assessment and treatment plan with the patient. The patient was provided an opportunity to ask questions and all were answered. The patient agreed with the plan and demonstrated an understanding of the instructions.   The patient was advised to call back or seek an in-person evaluation if the symptoms worsen or if the condition fails to improve as anticipated.  I provided 15 minutes of non-face-to-face time during this encounter.   Ann Held, DO

## 2019-05-15 ENCOUNTER — Encounter: Payer: Self-pay | Admitting: Internal Medicine

## 2019-05-18 ENCOUNTER — Telehealth: Payer: Self-pay | Admitting: Gastroenterology

## 2019-05-18 NOTE — Telephone Encounter (Signed)
Patient called with multiple questions about his clear liquid diet in preparation for colonoscopy 05/20/19. Please call him tomorrow to confirm his compliance with the recommendations and answer any additional questions. Thank you.

## 2019-05-19 ENCOUNTER — Telehealth: Payer: Self-pay | Admitting: Internal Medicine

## 2019-05-19 NOTE — Telephone Encounter (Signed)
Left message for pt to call back  °

## 2019-05-19 NOTE — Telephone Encounter (Signed)

## 2019-05-19 NOTE — Telephone Encounter (Signed)
Pt returned call and answered “No” to all questions.  °  °Pt made aware of that care partner may come to the lobby during the procedure but will need to provide their own mask. ° ° °

## 2019-05-20 ENCOUNTER — Encounter: Payer: Self-pay | Admitting: Internal Medicine

## 2019-05-20 ENCOUNTER — Ambulatory Visit (AMBULATORY_SURGERY_CENTER): Payer: BC Managed Care – PPO | Admitting: Internal Medicine

## 2019-05-20 ENCOUNTER — Other Ambulatory Visit: Payer: Self-pay

## 2019-05-20 VITALS — BP 134/80 | HR 58 | Temp 98.4°F | Resp 18 | Ht 73.0 in | Wt 230.0 lb

## 2019-05-20 DIAGNOSIS — D120 Benign neoplasm of cecum: Secondary | ICD-10-CM

## 2019-05-20 DIAGNOSIS — Z800 Family history of malignant neoplasm of digestive organs: Secondary | ICD-10-CM | POA: Diagnosis not present

## 2019-05-20 DIAGNOSIS — Z86010 Personal history of colonic polyps: Secondary | ICD-10-CM | POA: Diagnosis not present

## 2019-05-20 MED ORDER — SODIUM CHLORIDE 0.9 % IV SOLN: 500.0000 mL | Freq: Once | INTRAVENOUS | Status: AC

## 2019-05-20 NOTE — Progress Notes (Signed)
Called to room to assist during endoscopic procedure.  Patient ID and intended procedure confirmed with present staff. Received instructions for my participation in the procedure from the performing physician.  

## 2019-05-20 NOTE — Op Note (Signed)
[place] Patient Name: Bobby Castaneda Procedure Date: 05/20/2019 11:01 AM MRN: [id number] Endoscopist: Docia Chuck. Henrene Pastor , MD Age: 53 Referring MD:  Date of Birth: [birth year] Gender: Male Account #: [id number] Procedure:                Colonoscopy with cold snare polypectomy x 2 Indications:              High risk colon cancer surveillance: Personal                            history of adenomas less than 10 mm in size, High                            risk colon cancer surveillance: Personal history of                            sessile serrated colon polyp (less than 10 mm in                            size) with no dysplasia. Also father with colon                            cancer in his 54s. Previous examinations 2015 and                            2018 (compromised by poor prep). Medicines:                Monitored Anesthesia Care Procedure:                Pre-Anesthesia Assessment:                           - Prior to the procedure, a History and Physical                            was performed, and patient medications and                            allergies were reviewed. The patient's tolerance of                            previous anesthesia was also reviewed. The risks                            and benefits of the procedure and the sedation                            options and risks were discussed with the patient.                            All questions were answered, and informed consent                            was obtained. Prior Anticoagulants: The patient has  taken no previous anticoagulant or antiplatelet                            agents. ASA Grade Assessment: II - A patient with                            mild systemic disease. After reviewing the risks                            and benefits, the patient was deemed in                            satisfactory condition to undergo the procedure.  After obtaining informed consent, the colonoscope                            was passed under direct vision. Throughout the                            procedure, the patient's blood pressure, pulse, and                            oxygen saturations were monitored continuously. The                            Colonoscope was introduced through the anus and                            advanced to the the cecum, identified by                            appendiceal orifice and ileocecal valve. The                            ileocecal valve, appendiceal orifice, and rectum                            were photographed. The quality of the bowel                            preparation was adequate (after upgrading with                            vigorous irrigation and suctioning) to identify                            polyps. The colonoscopy was performed without                            difficulty. The patient tolerated the procedure                            well. The EXTENSIVE bowel preparation used was  magnesium citrate followed by SUPREP via split dose                            instruction, in conjunction with 2 days of clear                            liquids. Scope In: 11:12:56 AM Scope Out: 11:33:09 AM Scope Withdrawal Time: 0 hours 13 minutes 17 seconds  Total Procedure Duration: 0 hours 20 minutes 13 seconds  Findings:                 Two polyps were found in the cecum. The polyps were                            4 to 7 mm in size. These polyps were removed with a                            cold snare. Resection and retrieval were complete.                           The colon was redundant. The exam was otherwise                            without abnormality on direct and retroflexion                            views. Complications:            No immediate complications. Estimated blood loss:                            None. Estimated Blood Loss:      Estimated blood loss: none. Impression:               - Two 4 to 7 mm polyps in the cecum, removed with a                            cold snare. Resected and retrieved.                           - The examination was otherwise normal on direct                            and retroflexion views. Redundant colon Recommendation:           - Repeat colonoscopy in 3 years for surveillance                            (EXTENSIVE PREPARATION as today, adult scope).                           - Patient has a contact number available for                            emergencies. The signs and symptoms of potential  delayed complications were discussed with the                            patient. Return to normal activities tomorrow.                            Written discharge instructions were provided to the                            patient.                           - Resume previous diet.                           - Continue present medications.                           - Await pathology results. Docia Chuck. Henrene Pastor, MD 05/20/2019 11:42:26 AM This report has been signed electronically.

## 2019-05-20 NOTE — Patient Instructions (Signed)
Discharge instructions given. Handout on polyps. Resume previous medications. YOU HAD AN ENDOSCOPIC PROCEDURE TODAY AT THE [facility]:   Refer to the procedure report that was given to you for any specific questions about what was found during the examination.  If the procedure report does not answer your questions, please call your gastroenterologist to clarify.  If you requested that your care partner not be given the details of your procedure findings, then the procedure report has been included in a sealed envelope for you to review at your convenience later.  YOU SHOULD EXPECT: Some feelings of bloating in the abdomen. Passage of more gas than usual.  Walking can help get rid of the air that was put into your GI tract during the procedure and reduce the bloating. If you had a lower endoscopy (such as a colonoscopy or flexible sigmoidoscopy) you may notice spotting of blood in your stool or on the toilet paper. If you underwent a bowel prep for your procedure, you may not have a normal bowel movement for a few days.  Please Note:  You might notice some irritation and congestion in your nose or some drainage.  This is from the oxygen used during your procedure.  There is no need for concern and it should clear up in a day or so.  SYMPTOMS TO REPORT IMMEDIATELY:   Following lower endoscopy (colonoscopy or flexible sigmoidoscopy):  Excessive amounts of blood in the stool  Significant tenderness or worsening of abdominal pains  Swelling of the abdomen that is new, acute  Fever of 100F or higher   For urgent or emergent issues, a gastroenterologist can be reached at any hour by calling [phone].   DIET:  We do recommend a small meal at first, but then you may proceed to your regular diet.  Drink plenty of fluids but you should avoid alcoholic beverages for 24 hours.  ACTIVITY:  You should plan to take it easy for the rest of today and you should NOT DRIVE or use heavy  machinery until tomorrow (because of the sedation medicines used during the test).    FOLLOW UP: Our staff will call the number listed on your records 48-72 hours following your procedure to check on you and address any questions or concerns that you may have regarding the information given to you following your procedure. If we do not reach you, we will leave a message.  We will attempt to reach you two times.  During this call, we will ask if you have developed any symptoms of COVID 19. If you develop any symptoms (ie: fever, flu-like symptoms, shortness of breath, cough etc.) before then, please call [phone].  If you test positive for Covid 19 in the 2 weeks post procedure, please call and report this information to us.    If any biopsies were taken you will be contacted by phone or by letter within the next 1-3 weeks.  Please call us at [phone] if you have not heard about the biopsies in 3 weeks.    SIGNATURES/CONFIDENTIALITY: You and/or your care partner have signed paperwork which will be entered into your electronic medical record.  These signatures attest to the fact that that the information above on your After Visit Summary has been reviewed and is understood.  Full responsibility of the confidentiality of this discharge information lies with you and/or your care-partner. 

## 2019-05-20 NOTE — Progress Notes (Signed)
Temperature taken by J.B., CMA, VS taken by C.W., CMA

## 2019-05-20 NOTE — Progress Notes (Signed)
Pt's states no medical or surgical changes since previsit or office visit. 

## 2019-05-20 NOTE — Progress Notes (Signed)
To PACU, VSS. Report to [email or web address].tb 

## 2019-05-21 NOTE — Telephone Encounter (Signed)
Pt did not return the call, his procedure was today.

## 2019-05-22 ENCOUNTER — Encounter: Payer: Self-pay | Admitting: Internal Medicine

## 2019-05-22 ENCOUNTER — Telehealth: Payer: Self-pay

## 2019-05-22 NOTE — Telephone Encounter (Signed)
  Follow up Call-  Call back number 05/20/2019 03/28/2017  Post procedure Call Back phone  # [phone]  Permission to leave phone message Yes Yes  Some recent data might be hidden     Patient questions:  Do you have a fever, pain , or abdominal swelling? No. Pain Score  0 *  Have you tolerated food without any problems? Yes.    Have you been able to return to your normal activities? Yes.    Do you have any questions about your discharge instructions: Diet   No. Medications  No. Follow up visit  No.  Do you have questions or concerns about your Care? No.  Actions: * If pain score is 4 or above: No action needed, pain <4. 1. Have you developed a fever since your procedure? no  2.   Have you had an respiratory symptoms (SOB or cough) since your procedure? no  3.   Have you tested positive for COVID 19 since your procedure no  4.   Have you had any family members/close contacts diagnosed with the COVID 19 since your procedure?  no   If yes to any of these questions please route to Joylene John, RN and Alphonsa Gin, Therapist, sports.

## 2019-05-26 MED FILL — POTASSIUM CHLORIDE CRYS ER: 20 | 30 days supply | Qty: 30 | Fill #0

## 2019-06-02 ENCOUNTER — Other Ambulatory Visit: Payer: Self-pay

## 2019-06-05 ENCOUNTER — Ambulatory Visit: Payer: BC Managed Care – PPO

## 2019-06-08 ENCOUNTER — Ambulatory Visit: Payer: BC Managed Care – PPO | Admitting: Family Medicine

## 2019-06-12 ENCOUNTER — Ambulatory Visit (INDEPENDENT_AMBULATORY_CARE_PROVIDER_SITE_OTHER): Payer: BC Managed Care – PPO

## 2019-06-12 ENCOUNTER — Other Ambulatory Visit: Payer: Self-pay

## 2019-06-12 DIAGNOSIS — Z23 Encounter for immunization: Secondary | ICD-10-CM | POA: Diagnosis not present

## 2019-07-01 MED FILL — POTASSIUM CHLORIDE CRYS ER: 20 | 30 days supply | Qty: 30 | Fill #1

## 2019-07-10 ENCOUNTER — Other Ambulatory Visit: Payer: Self-pay | Admitting: Family Medicine

## 2019-07-10 MED FILL — ATORVASTATIN 20 MG TABLET: 20 | 90 days supply | Qty: 90 | Fill #0

## 2019-07-10 MED FILL — AMLODIPINE BESYLATE 10 MG T: 10 | 90 days supply | Qty: 90 | Fill #0

## 2019-10-19 ENCOUNTER — Other Ambulatory Visit: Payer: Self-pay | Admitting: Family Medicine

## 2019-10-19 MED FILL — AMLODIPINE BESYLATE 10 MG T: 10 | 90 days supply | Qty: 90 | Fill #0

## 2019-10-19 MED FILL — ATORVASTATIN CALCIUM 20 MG: 20 | 90 days supply | Qty: 90 | Fill #0

## 2019-10-22 ENCOUNTER — Other Ambulatory Visit: Payer: Self-pay

## 2019-10-23 ENCOUNTER — Other Ambulatory Visit: Payer: Self-pay

## 2019-10-23 ENCOUNTER — Ambulatory Visit: Payer: BC Managed Care – PPO | Admitting: Family Medicine

## 2019-10-26 ENCOUNTER — Other Ambulatory Visit: Payer: Self-pay

## 2019-10-26 ENCOUNTER — Ambulatory Visit: Payer: BC Managed Care – PPO | Admitting: Family Medicine

## 2019-10-26 ENCOUNTER — Encounter: Payer: Self-pay | Admitting: Family Medicine

## 2019-10-26 VITALS — BP 130/90 | HR 63 | Temp 97.2°F | Resp 18 | Ht 73.0 in | Wt 230.2 lb

## 2019-10-26 DIAGNOSIS — E785 Hyperlipidemia, unspecified: Secondary | ICD-10-CM | POA: Diagnosis not present

## 2019-10-26 DIAGNOSIS — I10 Essential (primary) hypertension: Secondary | ICD-10-CM

## 2019-10-26 DIAGNOSIS — N529 Male erectile dysfunction, unspecified: Secondary | ICD-10-CM | POA: Diagnosis not present

## 2019-10-26 LAB — COMPREHENSIVE METABOLIC PANEL
ALT: 15 U/L (ref 0–53)
AST: 19 U/L (ref 0–37)
Albumin: 4.2 g/dL (ref 3.5–5.2)
Alkaline Phosphatase: 80 U/L (ref 39–117)
BUN: 11 mg/dL (ref 6–23)
CO2: 32 mEq/L (ref 19–32)
Calcium: 9.6 mg/dL (ref 8.4–10.5)
Chloride: 102 mEq/L (ref 96–112)
Creatinine, Ser: 1.34 mg/dL (ref 0.40–1.50)
GFR: 66.99 mL/min (ref >60.00)
Glucose, Bld: 65 mg/dL — ABNORMAL LOW (ref 70–99)
Potassium: 3.8 mEq/L (ref 3.5–5.1)
Sodium: 140 mEq/L (ref 135–145)
Total Bilirubin: 0.8 mg/dL (ref 0.2–1.2)
Total Protein: 7.6 g/dL (ref 6.0–8.3)

## 2019-10-26 LAB — LIPID PANEL
Cholesterol: 115 mg/dL (ref 0–200)
HDL: 39.2 mg/dL (ref >39.00)
LDL Cholesterol: 56 mg/dL (ref 0–99)
NonHDL: 75.99
Total CHOL/HDL Ratio: 3
Triglycerides: 100 mg/dL (ref 0.0–149.0)
VLDL: 20 mg/dL (ref 0.0–40.0)

## 2019-10-26 LAB — PSA: PSA: 2.17 ng/mL (ref 0.10–4.00)

## 2019-10-26 MED ORDER — SILDENAFIL CITRATE 100 MG PO TABS: 50.0000 mg | ORAL_TABLET | Freq: Every day | ORAL | 11 refills | Status: DC | PRN

## 2019-10-26 MED ORDER — METOPROLOL SUCCINATE ER 25 MG PO TB24: 25.0000 mg | ORAL_TABLET | Freq: Every day | ORAL | 3 refills | Status: DC

## 2019-10-26 MED FILL — SILDENAFIL CITRATE 100 MG T: 100 | 10 days supply | Qty: 10 | Fill #0

## 2019-10-26 NOTE — Assessment & Plan Note (Signed)
Tolerating statin, encouraged heart healthy diet, avoid trans fats, minimize simple carbs and saturated fats. Increase exercise as tolerated 

## 2019-10-26 NOTE — Progress Notes (Signed)
+Patient ID: Bobby Neer., male    DOB: 07/28/1966  Age: 54 y.o. MRN: [id number]    Subjective:  Subjective  HPI Bobby Detoro. presents for f/u bp and cholesterol No complaints   Review of Systems  Constitutional: Negative.   HENT: Negative for congestion, ear pain, hearing loss, nosebleeds, postnasal drip, rhinorrhea, sinus pressure, sneezing and tinnitus.   Eyes: Negative for photophobia, discharge, itching and visual disturbance.  Respiratory: Negative.   Cardiovascular: Negative.   Gastrointestinal: Negative for abdominal distention, abdominal pain, anal bleeding, blood in stool and constipation.  Endocrine: Negative.   Genitourinary: Negative.   Musculoskeletal: Negative.   Skin: Negative.   Allergic/Immunologic: Negative.   Neurological: Negative for dizziness, weakness, light-headedness, numbness and headaches.  Psychiatric/Behavioral: Negative for agitation, confusion, decreased concentration, dysphoric mood, sleep disturbance and suicidal ideas. The patient is not nervous/anxious.     History Past Medical History:  Diagnosis Date  . Allergy   . Bell's palsy    History  . Hyperlipidemia   . Hypertension   . Sleep apnea    uses CPAP nightly    He has a past surgical history that includes Knee surgery; Adenoidectomy (08/2011); Closed reduction hand fracture (12/2010); Colonoscopy; and Polypectomy.   His family history includes Alzheimer's disease in his maternal grandmother; Brain cancer in his sister; Breast cancer in his maternal aunt and mother; Cancer in his father; Cancer (age of onset: 66) in his maternal aunt; Cancer (age of onset: 67) in his mother; Colon cancer in his father; Heart disease in his maternal grandfather and maternal uncle; Hyperlipidemia in his father and maternal grandfather; Hypertension in his father, maternal grandfather, maternal grandmother, maternal uncle, maternal uncle, mother, sister, and another family member; Stroke in his  father, maternal grandfather, and maternal uncle.He reports that he has never smoked. He has never used smokeless tobacco. He reports that he does not drink alcohol or use drugs.  Current Outpatient Medications on File Prior to Visit  Medication Sig Dispense Refill  . amLODipine (NORVASC) 10 MG tablet TAKE 1 TABLET (10 MG TOTAL) BY MOUTH DAILY. 90 tablet 0  . atorvastatin (LIPITOR) 20 MG tablet TAKE 1 TABLET (20 MG TOTAL) BY MOUTH AT BEDTIME. 90 tablet 0  . potassium chloride SA (K-DUR) 20 MEQ tablet     . fluticasone (FLONASE) 50 MCG/ACT nasal spray Place 2 sprays into both nostrils daily. (Patient not taking: Reported on 05/05/2019) 16 g 6  . loratadine (CLARITIN) 10 MG tablet Take 1 tablet (10 mg total) by mouth daily. (Patient not taking: Reported on 05/05/2019) 30 tablet 11  . magnesium citrate SOLN Take 1 Bottle by mouth once. 1 bottle for a double prep for Dr Henrene Pastor 8-12    . methocarbamol (ROBAXIN) 500 MG tablet Take 1 tablet (500 mg total) by mouth every 8 (eight) hours as needed. (Patient not taking: Reported on 03/05/2019) 30 tablet 0   Current Facility-Administered Medications on File Prior to Visit  Medication Dose Route Frequency Provider Last Rate Last Admin  . 0.9 %  sodium chloride infusion  500 mL Intravenous Continuous Irene Shipper, MD      . 0.9 %  sodium chloride infusion  500 mL Intravenous Once Irene Shipper, MD         Objective:  Objective  Physical Exam Vitals and nursing note reviewed.  Constitutional:      General: He is sleeping.     Appearance: He is well-developed.  HENT:     Head: Normocephalic  and atraumatic.  Eyes:     Pupils: Pupils are equal, round, and reactive to light.  Neck:     Thyroid: No thyromegaly.  Cardiovascular:     Rate and Rhythm: Normal rate and regular rhythm.     Heart sounds: No murmur.  Pulmonary:     Effort: Pulmonary effort is normal. No respiratory distress.     Breath sounds: Normal breath sounds. No wheezing or rales.   Chest:     Chest wall: No tenderness.  Musculoskeletal:        General: No tenderness.     Cervical back: Normal range of motion and neck supple.  Skin:    General: Skin is warm and dry.  Neurological:     Mental Status: He is oriented to person, place, and time.  Psychiatric:        Behavior: Behavior normal.        Thought Content: Thought content normal.        Judgment: Judgment normal.    BP 130/90 (BP Location: Left Arm, Patient Position: Sitting, Cuff Size: Large)   Pulse 63   Temp (!) 97.2 F (36.2 C) (Temporal)   Resp 18   Ht 6\' 1"  (1.854 m)   Wt 230 lb 3.2 oz (104.4 kg)   SpO2 97%   BMI 30.37 kg/m  Wt Readings from Last 3 Encounters:  10/26/19 230 lb 3.2 oz (104.4 kg)  05/20/19 230 lb (104.3 kg)  05/05/19 230 lb (104.3 kg)     Lab Results  Component Value Date   WBC 4.0 07/08/2018   HGB 12.8 (L) 07/08/2018   HCT 38.0 (L) 07/08/2018   PLT 229.0 07/08/2018   GLUCOSE 95 05/04/2019   CHOL 131 05/04/2019   TRIG 77.0 05/04/2019   HDL 38.60 (L) 05/04/2019   LDLCALC 77 05/04/2019   ALT 18 05/04/2019   AST 21 05/04/2019   NA 140 05/04/2019   K 3.8 05/04/2019   CL 105 05/04/2019   CREATININE 1.44 05/04/2019   BUN 16 05/04/2019   CO2 27 05/04/2019   TSH 1.56 07/08/2018   PSA 2.28 07/08/2018   MICROALBUR 2.2 (H) 02/17/2013    No results found.   Assessment & Plan:  Plan  I am having Bobby Neer. start on sildenafil. I am also having him maintain his loratadine, fluticasone, methocarbamol, potassium chloride SA, magnesium citrate, atorvastatin, amLODipine, and metoprolol succinate. We will continue to administer sodium chloride and sodium chloride.  Meds ordered this encounter  Medications  . metoprolol succinate (TOPROL-XL) 25 MG 24 hr tablet    Sig: Take 1 tablet (25 mg total) by mouth daily.    Dispense:  90 tablet    Refill:  3  . sildenafil (VIAGRA) 100 MG tablet    Sig: Take 0.5-1 tablets (50-100 mg total) by mouth daily as needed for  erectile dysfunction.    Dispense:  10 tablet    Refill:  11    Problem List Items Addressed This Visit      Unprioritized   Erectile dysfunction   Relevant Medications   sildenafil (VIAGRA) 100 MG tablet   Other Relevant Orders   PSA   Essential hypertension - Primary    Poorly controlled will alter medications, encouraged DASH diet, minimize caffeine and obtain adequate sleep. Report concerning symptoms and follow up as directed and as needed      Relevant Medications   metoprolol succinate (TOPROL-XL) 25 MG 24 hr tablet   sildenafil (VIAGRA) 100 MG  tablet   Other Relevant Orders   Lipid panel   Comprehensive metabolic panel   Hyperlipidemia    Tolerating statin, encouraged heart healthy diet, avoid trans fats, minimize simple carbs and saturated fats. Increase exercise as tolerated      Relevant Medications   metoprolol succinate (TOPROL-XL) 25 MG 24 hr tablet   sildenafil (VIAGRA) 100 MG tablet   Other Relevant Orders   Lipid panel   Comprehensive metabolic panel      Follow-up: Return in about 6 months (around 04/24/2020), or if symptoms worsen or fail to improve, for fasting, annual exam.  Ann Held, DO

## 2019-10-26 NOTE — Assessment & Plan Note (Signed)
Poorly controlled will alter medications, encouraged DASH diet, minimize caffeine and obtain adequate sleep. Report concerning symptoms and follow up as directed and as needed 

## 2019-10-26 NOTE — Patient Instructions (Signed)
COVID-19 Vaccine Information can be found at: [email or web address] For questions related to vaccine distribution or appointments, please email [email or web address] or call [phone].    

## 2019-11-19 MED FILL — POTASSIUM CHLORIDE CRYS ER: 20 | 30 days supply | Qty: 30 | Fill #2

## 2019-12-05 ENCOUNTER — Ambulatory Visit: Payer: BC Managed Care – PPO | Attending: Internal Medicine

## 2019-12-05 DIAGNOSIS — Z23 Encounter for immunization: Secondary | ICD-10-CM | POA: Insufficient documentation

## 2019-12-05 NOTE — Progress Notes (Signed)
   [id number]  Name:  Bobby Castaneda.    MRN: [id number] DOB: [birth year]  12/05/2019  Mr. Bobby Castaneda was observed post Covid-19 immunization for 15 minutes without incidence. He was provided with Vaccine Information Sheet and instruction to access the V-Safe system.   Mr. Bobby Castaneda was instructed to call 911 with any severe reactions post vaccine: Marland Kitchen Difficulty breathing  . Swelling of your face and throat  . A fast heartbeat  . A bad rash all over your body  . Dizziness and weakness    Immunizations Administered    Name Date Dose VIS Date Route   Pfizer COVID-19 Vaccine 12/05/2019  5:00 PM 0.3 mL 09/18/2019 Intramuscular   Manufacturer: [place]   Lot: [id number]   [place]: [id number]

## 2019-12-17 MED FILL — SILDENAFIL CITRATE 100 MG T: 100 | 10 days supply | Qty: 10 | Fill #1

## 2019-12-26 ENCOUNTER — Ambulatory Visit: Payer: BC Managed Care – PPO | Attending: Internal Medicine

## 2019-12-26 DIAGNOSIS — Z23 Encounter for immunization: Secondary | ICD-10-CM

## 2019-12-26 NOTE — Progress Notes (Signed)
   [id number] [facility]  Name:  Bobby Castaneda.    MRN: [id number] DOB: [birth year]  12/26/2019  [name] was observed post Covid-19 immunization for 15 minutes without incident. He was provided with Vaccine Information Sheet and instruction to access the V-Safe system.   [name] was instructed to call 911 with any severe reactions post vaccine: Marland Kitchen Difficulty breathing  . Swelling of face and throat  . A fast heartbeat  . A bad rash all over body  . Dizziness and weakness   Immunizations Administered    Name Date Dose VIS Date Route   Pfizer COVID-19 Vaccine 12/26/2019  9:23 AM 0.3 mL 09/18/2019 Intramuscular   Manufacturer: [place]   Lot: [id number]   [place]: [id number]

## 2020-01-20 ENCOUNTER — Other Ambulatory Visit: Payer: Self-pay | Admitting: Family Medicine

## 2020-01-20 MED FILL — AMLODIPINE BESYLATE 10 MG T: 10 | 90 days supply | Qty: 90 | Fill #0

## 2020-01-20 MED FILL — POTASSIUM CHLORIDE CRYS ER: 20 | 30 days supply | Qty: 30 | Fill #2

## 2020-01-20 MED FILL — ATORVASTATIN 20 MG TABLET: 20 | 90 days supply | Qty: 90 | Fill #0

## 2020-02-12 MED FILL — SILDENAFIL CITRATE 100 MG T: 100 | 10 days supply | Qty: 10 | Fill #2

## 2020-04-19 ENCOUNTER — Other Ambulatory Visit: Payer: Self-pay | Admitting: Family Medicine

## 2020-04-19 MED FILL — AMLODIPINE BESYLATE 10 MG T: 10 | 90 days supply | Qty: 90 | Fill #0

## 2020-04-20 ENCOUNTER — Other Ambulatory Visit: Payer: Self-pay | Admitting: Family Medicine

## 2020-04-20 DIAGNOSIS — I10 Essential (primary) hypertension: Secondary | ICD-10-CM

## 2020-04-20 MED FILL — METOPROLOL SUCCINATE ER 25: 25 | 90 days supply | Qty: 90 | Fill #0

## 2020-04-22 ENCOUNTER — Encounter: Payer: BC Managed Care – PPO | Admitting: Family Medicine

## 2020-05-05 ENCOUNTER — Other Ambulatory Visit: Payer: Self-pay

## 2020-05-05 ENCOUNTER — Ambulatory Visit (INDEPENDENT_AMBULATORY_CARE_PROVIDER_SITE_OTHER): Payer: BC Managed Care – PPO | Admitting: Family Medicine

## 2020-05-05 ENCOUNTER — Encounter: Payer: Self-pay | Admitting: Family Medicine

## 2020-05-05 VITALS — BP 122/80 | HR 60 | Resp 16 | Ht 73.0 in | Wt 241.0 lb

## 2020-05-05 DIAGNOSIS — Z0000 Encounter for general adult medical examination without abnormal findings: Secondary | ICD-10-CM | POA: Diagnosis not present

## 2020-05-05 DIAGNOSIS — Z1159 Encounter for screening for other viral diseases: Secondary | ICD-10-CM | POA: Diagnosis not present

## 2020-05-05 DIAGNOSIS — Z125 Encounter for screening for malignant neoplasm of prostate: Secondary | ICD-10-CM

## 2020-05-05 NOTE — Progress Notes (Signed)
Patient ID: Bobby Neer., male    DOB: 07-Apr-1966  Age: 54 y.o. MRN: [id number]    Subjective:  Subjective  HPI Bobby Castaneda. presents for cpe and labs   No complaints  Review of Systems  Constitutional: Negative for appetite change, diaphoresis, fatigue and unexpected weight change.  Eyes: Negative for pain, redness and visual disturbance.  Respiratory: Negative for cough, chest tightness, shortness of breath and wheezing.   Cardiovascular: Negative for chest pain, palpitations and leg swelling.  Endocrine: Negative for cold intolerance, heat intolerance, polydipsia, polyphagia and polyuria.  Genitourinary: Negative for difficulty urinating, dysuria and frequency.  Neurological: Negative for dizziness, light-headedness, numbness and headaches.    History Past Medical History:  Diagnosis Date  . Allergy   . Bell's palsy    History  . Hyperlipidemia   . Hypertension   . Sleep apnea    uses CPAP nightly    He has a past surgical history that includes Knee surgery; Adenoidectomy (08/2011); Closed reduction hand fracture (12/2010); Colonoscopy; and Polypectomy.   His family history includes Alzheimer's disease in his maternal grandmother; Brain cancer in his sister; Breast cancer in his maternal aunt and mother; Cancer in his father; Cancer (age of onset: 39) in his maternal aunt; Cancer (age of onset: 12) in his mother; Colon cancer in his father; Heart disease in his maternal grandfather and maternal uncle; Hyperlipidemia in his father and maternal grandfather; Hypertension in his father, maternal grandfather, maternal grandmother, maternal uncle, maternal uncle, mother, sister, and another family member; Stroke in his father, maternal grandfather, and maternal uncle.He reports that he has never smoked. He has never used smokeless tobacco. He reports that he does not drink alcohol and does not use drugs.  Current Outpatient Medications on File Prior to Visit  Medication Sig  Dispense Refill  . amLODipine (NORVASC) 10 MG tablet TAKE 1 TABLET (10 MG TOTAL) BY MOUTH DAILY. 90 tablet 0  . atorvastatin (LIPITOR) 20 MG tablet TAKE 1 TABLET (20 MG TOTAL) BY MOUTH AT BEDTIME. 90 tablet 0  . metoprolol succinate (TOPROL-XL) 25 MG 24 hr tablet TAKE 1 TABLET (25 MG TOTAL) BY MOUTH DAILY. 90 tablet 1  . sildenafil (VIAGRA) 100 MG tablet Take 0.5-1 tablets (50-100 mg total) by mouth daily as needed for erectile dysfunction. 10 tablet 11   Current Facility-Administered Medications on File Prior to Visit  Medication Dose Route Frequency Provider Last Rate Last Admin  . 0.9 %  sodium chloride infusion  500 mL Intravenous Continuous Irene Shipper, MD      . 0.9 %  sodium chloride infusion  500 mL Intravenous Once Irene Shipper, MD         Objective:  Objective  Physical Exam Vitals and nursing note reviewed.  Constitutional:      General: He is not in acute distress.    Appearance: He is well-developed. He is not diaphoretic.  HENT:     Head: Normocephalic and atraumatic.     Right Ear: External ear normal.     Left Ear: External ear normal.     Nose: Nose normal.     Mouth/Throat:     Pharynx: No oropharyngeal exudate.  Eyes:     General:        Right eye: No discharge.        Left eye: No discharge.     Conjunctiva/sclera: Conjunctivae normal.     Pupils: Pupils are equal, round, and reactive to light.  Neck:  Thyroid: No thyromegaly.     Vascular: No JVD.  Cardiovascular:     Rate and Rhythm: Normal rate and regular rhythm.     Heart sounds: No murmur heard.   Pulmonary:     Effort: Pulmonary effort is normal. No respiratory distress.     Breath sounds: Normal breath sounds. No wheezing or rales.  Chest:     Chest wall: No tenderness.  Abdominal:     General: Bowel sounds are normal. There is no distension.     Palpations: Abdomen is soft. There is no mass.     Tenderness: There is no abdominal tenderness. There is no guarding or rebound.    Genitourinary:    Penis: Normal.      Testes: Normal.     Prostate: Normal.     Rectum: Guaiac result negative.  Musculoskeletal:        General: No tenderness. Normal range of motion.     Cervical back: Normal range of motion and neck supple.  Lymphadenopathy:     Cervical: No cervical adenopathy.  Skin:    General: Skin is warm and dry.     Findings: No erythema or rash.  Neurological:     Mental Status: He is alert and oriented to person, place, and time.     Cranial Nerves: No cranial nerve deficit.     Motor: No abnormal muscle tone.     Deep Tendon Reflexes: Reflexes are normal and symmetric. Reflexes normal.  Psychiatric:        Behavior: Behavior normal.        Thought Content: Thought content normal.        Judgment: Judgment normal.    BP 122/80 (BP Location: Right Arm, Patient Position: Sitting, Cuff Size: Large)   Pulse 60   Resp 16   Ht 6\' 1"  (1.854 m)   Wt (!) 241 lb (109.3 kg)   SpO2 98%   BMI 31.80 kg/m  Wt Readings from Last 3 Encounters:  05/05/20 (!) 241 lb (109.3 kg)  10/26/19 230 lb 3.2 oz (104.4 kg)  05/20/19 230 lb (104.3 kg)     Lab Results  Component Value Date   WBC 4.0 07/08/2018   HGB 12.8 (L) 07/08/2018   HCT 38.0 (L) 07/08/2018   PLT 229.0 07/08/2018   GLUCOSE 65 (L) 10/26/2019   CHOL 115 10/26/2019   TRIG 100.0 10/26/2019   HDL 39.20 10/26/2019   LDLCALC 56 10/26/2019   ALT 15 10/26/2019   AST 19 10/26/2019   NA 140 10/26/2019   K 3.8 10/26/2019   CL 102 10/26/2019   CREATININE 1.34 10/26/2019   BUN 11 10/26/2019   CO2 32 10/26/2019   TSH 1.56 07/08/2018   PSA 2.17 10/26/2019   MICROALBUR 2.2 (H) 02/17/2013   Health Maintenance  Topic Date Due  . Hepatitis C Screening  Never done  . INFLUENZA VACCINE  05/08/2020  . COLONOSCOPY  05/19/2022  . TETANUS/TDAP  07/18/2025  . COVID-19 Vaccine  Completed  . HIV Screening  Completed    No results found.   Assessment & Plan:  Plan  I have discontinued Bobby Castaneda  Jr.'s loratadine, fluticasone, methocarbamol, potassium chloride SA, and magnesium citrate. I am also having him maintain his sildenafil, atorvastatin, amLODipine, and metoprolol succinate. We will continue to administer sodium chloride and sodium chloride.  No orders of the defined types were placed in this encounter.   Problem List Items Addressed This Visit      Unprioritized  Preventative health care   Relevant Orders   Lipid panel   Comprehensive metabolic panel   PSA   TSH   CBC w/Diff    Other Visit Diagnoses    Annual physical exam    -  Primary   Relevant Orders   Lipid panel   Comprehensive metabolic panel   PSA   TSH   CBC w/Diff   Need for hepatitis C screening test       Relevant Orders   Hepatitis C antibody      Follow-up: Return in about 6 months (around 11/05/2020) for hypertension, hyperlipidemia.  Ann Held, DO

## 2020-05-05 NOTE — Patient Instructions (Signed)
Preventive Care 41-55 Years Old, Male Preventive care refers to lifestyle choices and visits with your health care provider that can promote health and wellness. This includes:  A yearly physical exam. This is also called an annual well check.  Regular dental and eye exams.  Immunizations.  Screening for certain conditions.  Healthy lifestyle choices, such as eating a healthy diet, getting regular exercise, not using drugs or products that contain nicotine and tobacco, and limiting alcohol use. What can I expect for my preventive care visit? Physical exam Your health care provider will check:  Height and weight. These may be used to calculate body mass index (BMI), which is a measurement that tells if you are at a healthy weight.  Heart rate and blood pressure.  Your skin for abnormal spots. Counseling Your health care provider may ask you questions about:  Alcohol, tobacco, and drug use.  Emotional well-being.  Home and relationship well-being.  Sexual activity.  Eating habits.  Work and work Statistician. What immunizations do I need?  Influenza (flu) vaccine  This is recommended every year. Tetanus, diphtheria, and pertussis (Tdap) vaccine  You may need a Td booster every 10 years. Varicella (chickenpox) vaccine  You may need this vaccine if you have not already been vaccinated. Zoster (shingles) vaccine  You may need this after age 64. Measles, mumps, and rubella (MMR) vaccine  You may need at least one dose of MMR if you were born in 1957 or later. You may also need a second dose. Pneumococcal conjugate (PCV13) vaccine  You may need this if you have certain conditions and were not previously vaccinated. Pneumococcal polysaccharide (PPSV23) vaccine  You may need one or two doses if you smoke cigarettes or if you have certain conditions. Meningococcal conjugate (MenACWY) vaccine  You may need this if you have certain conditions. Hepatitis A  vaccine  You may need this if you have certain conditions or if you travel or work in places where you may be exposed to hepatitis A. Hepatitis B vaccine  You may need this if you have certain conditions or if you travel or work in places where you may be exposed to hepatitis B. Haemophilus influenzae type b (Hib) vaccine  You may need this if you have certain risk factors. Human papillomavirus (HPV) vaccine  If recommended by your health care provider, you may need three doses over 6 months. You may receive vaccines as individual doses or as more than one vaccine together in one shot (combination vaccines). Talk with your health care provider about the risks and benefits of combination vaccines. What tests do I need? Blood tests  Lipid and cholesterol levels. These may be checked every 5 years, or more frequently if you are over 60 years old.  Hepatitis C test.  Hepatitis B test. Screening  Lung cancer screening. You may have this screening every year starting at age 43 if you have a 30-pack-year history of smoking and currently smoke or have quit within the past 15 years.  Prostate cancer screening. Recommendations will vary depending on your family history and other risks.  Colorectal cancer screening. All adults should have this screening starting at age 72 and continuing until age 2. Your health care provider may recommend screening at age 14 if you are at increased risk. You will have tests every 1-10 years, depending on your results and the type of screening test.  Diabetes screening. This is done by checking your blood sugar (glucose) after you have not eaten  for a while (fasting). You may have this done every 1-3 years.  Sexually transmitted disease (STD) testing. Follow these instructions at home: Eating and drinking  Eat a diet that includes fresh fruits and vegetables, whole grains, lean protein, and low-fat dairy products.  Take vitamin and mineral supplements as  recommended by your health care provider.  Do not drink alcohol if your health care provider tells you not to drink.  If you drink alcohol: ? Limit how much you have to 0-2 drinks a day. ? Be aware of how much alcohol is in your drink. In the [place] one drink equals one 12 oz bottle of beer (355 mL), one 5 oz glass of wine (148 mL), or one 1 oz glass of hard liquor (44 mL). Lifestyle  Take daily care of your teeth and gums.  Stay active. Exercise for at least 30 minutes on 5 or more days each week.  Do not use any products that contain nicotine or tobacco, such as cigarettes, e-cigarettes, and chewing tobacco. If you need help quitting, ask your health care provider.  If you are sexually active, practice safe sex. Use a condom or other form of protection to prevent STIs (sexually transmitted infections).  Talk with your health care provider about taking a low-dose aspirin every day starting at age 53. What's next?  Go to your health care provider once a year for a well check visit.  Ask your health care provider how often you should have your eyes and teeth checked.  Stay up to date on all vaccines. This information is not intended to replace advice given to you by your health care provider. Make sure you discuss any questions you have with your health care provider. Document Revised: 09/18/2018 Document Reviewed: 09/18/2018 Elsevier Patient Education  2020 [employer].

## 2020-05-06 ENCOUNTER — Other Ambulatory Visit: Payer: BC Managed Care – PPO

## 2020-05-06 DIAGNOSIS — Z1159 Encounter for screening for other viral diseases: Secondary | ICD-10-CM

## 2020-05-06 LAB — COMPREHENSIVE METABOLIC PANEL
ALT: 18 U/L (ref 0–53)
AST: 21 U/L (ref 0–37)
Albumin: 4.1 g/dL (ref 3.5–5.2)
Alkaline Phosphatase: 67 U/L (ref 39–117)
BUN: 16 mg/dL (ref 6–23)
CO2: 30 mEq/L (ref 19–32)
Calcium: 9.1 mg/dL (ref 8.4–10.5)
Chloride: 105 mEq/L (ref 96–112)
Creatinine, Ser: 1.43 mg/dL (ref 0.40–1.50)
GFR: 62.03 mL/min (ref >60.00)
Glucose, Bld: 92 mg/dL (ref 70–99)
Potassium: 3.3 mEq/L — ABNORMAL LOW (ref 3.5–5.1)
Sodium: 141 mEq/L (ref 135–145)
Total Bilirubin: 0.4 mg/dL (ref 0.2–1.2)
Total Protein: 7.3 g/dL (ref 6.0–8.3)

## 2020-05-06 LAB — CBC WITH DIFFERENTIAL/PLATELET
Basophils Absolute: 0.1 10*3/uL (ref 0.0–0.1)
Basophils Relative: 1.3 % (ref 0.0–3.0)
Eosinophils Absolute: 0.3 10*3/uL (ref 0.0–0.7)
Eosinophils Relative: 6.4 % — ABNORMAL HIGH (ref 0.0–5.0)
HCT: 38 % — ABNORMAL LOW (ref 39.0–52.0)
Hemoglobin: 12.8 g/dL — ABNORMAL LOW (ref 13.0–17.0)
Lymphocytes Relative: 46 % (ref 12.0–46.0)
Lymphs Abs: 1.9 10*3/uL (ref 0.7–4.0)
MCHC: 33.7 g/dL (ref 30.0–36.0)
MCV: 87 fl (ref 78.0–100.0)
Monocytes Absolute: 0.5 10*3/uL (ref 0.1–1.0)
Monocytes Relative: 11.1 % (ref 3.0–12.0)
Neutro Abs: 1.4 10*3/uL (ref 1.4–7.7)
Neutrophils Relative %: 35.2 % — ABNORMAL LOW (ref 43.0–77.0)
Platelets: 229 10*3/uL (ref 150.0–400.0)
RBC: 4.36 Mil/uL (ref 4.22–5.81)
RDW: 13.6 % (ref 11.5–15.5)
WBC: 4.1 10*3/uL (ref 4.0–10.5)

## 2020-05-06 LAB — LIPID PANEL
Cholesterol: 206 mg/dL — ABNORMAL HIGH (ref 0–200)
HDL: 38.9 mg/dL — ABNORMAL LOW (ref >39.00)
LDL Cholesterol: 136 mg/dL — ABNORMAL HIGH (ref 0–99)
NonHDL: 166.95
Total CHOL/HDL Ratio: 5
Triglycerides: 154 mg/dL — ABNORMAL HIGH (ref 0.0–149.0)
VLDL: 30.8 mg/dL (ref 0.0–40.0)

## 2020-05-06 LAB — PSA: PSA: 2.35 ng/mL (ref 0.10–4.00)

## 2020-05-06 LAB — TSH: TSH: 1.78 u[IU]/mL (ref 0.35–4.50)

## 2020-05-06 NOTE — Addendum Note (Signed)
Addended by: Caffie Pinto on: 05/06/2020 03:31 PM   Modules accepted: Orders

## 2020-05-09 ENCOUNTER — Other Ambulatory Visit: Payer: Self-pay | Admitting: Family Medicine

## 2020-05-09 LAB — HEPATITIS C ANTIBODY
Hepatitis C Ab: NONREACTIVE
SIGNAL TO CUT-OFF: 0.02 (ref <1.00)

## 2020-05-09 MED ORDER — ATORVASTATIN CALCIUM 40 MG PO TABS: 40.0000 mg | ORAL_TABLET | Freq: Every day | ORAL | 1 refills | Status: DC

## 2020-05-09 MED FILL — ATORVASTATIN 40 MG TABLET: 40 | 90 days supply | Qty: 90 | Fill #0

## 2020-05-09 NOTE — Addendum Note (Signed)
Addended by: Wynonia Musty A on: 05/09/2020 02:56 PM   Modules accepted: Orders

## 2020-05-12 MED FILL — SILDENAFIL CITRATE 100 MG T: 100 | 10 days supply | Qty: 10 | Fill #3

## 2020-06-17 ENCOUNTER — Telehealth: Payer: Self-pay | Admitting: Family Medicine

## 2020-06-17 NOTE — Telephone Encounter (Signed)
Caller: [name]  Call Back # [phone]  Patient states headache for two day, Patient states he believes it might be the change in his  medication.   Please Advise

## 2020-06-20 NOTE — Telephone Encounter (Signed)
Pt called. LVM to return call 

## 2020-06-23 ENCOUNTER — Ambulatory Visit: Payer: BC Managed Care – PPO | Admitting: Family Medicine

## 2020-06-23 ENCOUNTER — Encounter: Payer: Self-pay | Admitting: Family Medicine

## 2020-06-23 ENCOUNTER — Other Ambulatory Visit: Payer: Self-pay

## 2020-06-23 ENCOUNTER — Other Ambulatory Visit: Payer: Self-pay | Admitting: Family Medicine

## 2020-06-23 VITALS — BP 190/100 | HR 61 | Temp 99.0°F | Resp 18 | Ht 73.0 in | Wt 241.0 lb

## 2020-06-23 DIAGNOSIS — I10 Essential (primary) hypertension: Secondary | ICD-10-CM | POA: Diagnosis not present

## 2020-06-23 DIAGNOSIS — Z23 Encounter for immunization: Secondary | ICD-10-CM

## 2020-06-23 DIAGNOSIS — R079 Chest pain, unspecified: Secondary | ICD-10-CM

## 2020-06-23 MED ORDER — DOXAZOSIN MESYLATE 1 MG PO TABS: 1.0000 mg | ORAL_TABLET | Freq: Every day | ORAL | 2 refills | Status: DC

## 2020-06-23 MED FILL — DOXAZOSIN MESYLATE 1 MG TAB: 1 | 30 days supply | Qty: 30 | Fill #0

## 2020-06-23 NOTE — Progress Notes (Signed)
Patient ID: Bobby Neer., male    DOB: 1965/12/23  Age: 54 y.o. MRN: [id number]    Subjective:  Subjective  HPI Bobby Castaneda. presents for elevated bp and headaches x last several days.   Occasional cp --- not today   Review of Systems  Constitutional: Negative for appetite change, diaphoresis, fatigue and unexpected weight change.  Eyes: Negative for pain, redness and visual disturbance.  Respiratory: Negative for cough, chest tightness, shortness of breath and wheezing.   Cardiovascular: Positive for chest pain. Negative for palpitations and leg swelling.  Endocrine: Negative for cold intolerance, heat intolerance, polydipsia, polyphagia and polyuria.  Genitourinary: Negative for difficulty urinating, dysuria and frequency.  Neurological: Positive for headaches. Negative for dizziness, light-headedness and numbness.    History Past Medical History:  Diagnosis Date  . Allergy   . Bell's palsy    History  . Hyperlipidemia   . Hypertension   . Sleep apnea    uses CPAP nightly    He has a past surgical history that includes Knee surgery; Adenoidectomy (08/2011); Closed reduction hand fracture (12/2010); Colonoscopy; and Polypectomy.   His family history includes Alzheimer's disease in his maternal grandmother; Brain cancer in his sister; Breast cancer in his maternal aunt and mother; Cancer in his father; Cancer (age of onset: 56) in his maternal aunt; Cancer (age of onset: 85) in his mother; Colon cancer in his father; Heart disease in his maternal grandfather and maternal uncle; Hyperlipidemia in his father and maternal grandfather; Hypertension in his father, maternal grandfather, maternal grandmother, maternal uncle, maternal uncle, mother, sister, and another family member; Stroke in his father, maternal grandfather, and maternal uncle.He reports that he has never smoked. He has never used smokeless tobacco. He reports that he does not drink alcohol and does not use  drugs.  Current Outpatient Medications on File Prior to Visit  Medication Sig Dispense Refill  . amLODipine (NORVASC) 10 MG tablet TAKE 1 TABLET (10 MG TOTAL) BY MOUTH DAILY. 90 tablet 0  . atorvastatin (LIPITOR) 40 MG tablet Take 1 tablet (40 mg total) by mouth at bedtime. 90 tablet 1  . metoprolol succinate (TOPROL-XL) 25 MG 24 hr tablet TAKE 1 TABLET (25 MG TOTAL) BY MOUTH DAILY. 90 tablet 1  . sildenafil (VIAGRA) 100 MG tablet Take 0.5-1 tablets (50-100 mg total) by mouth daily as needed for erectile dysfunction. 10 tablet 11   Current Facility-Administered Medications on File Prior to Visit  Medication Dose Route Frequency Provider Last Rate Last Admin  . 0.9 %  sodium chloride infusion  500 mL Intravenous Continuous Irene Shipper, MD      . 0.9 %  sodium chloride infusion  500 mL Intravenous Once Irene Shipper, MD         Objective:  Objective  Physical Exam Vitals and nursing note reviewed.  Constitutional:      General: He is sleeping.     Appearance: He is well-developed.  HENT:     Head: Normocephalic and atraumatic.  Eyes:     Pupils: Pupils are equal, round, and reactive to light.  Neck:     Thyroid: No thyromegaly.  Cardiovascular:     Rate and Rhythm: Normal rate and regular rhythm.     Heart sounds: No murmur heard.   Pulmonary:     Effort: Pulmonary effort is normal. No respiratory distress.     Breath sounds: Normal breath sounds. No wheezing or rales.  Chest:     Chest wall: No  tenderness.  Musculoskeletal:        General: No swelling or tenderness.     Cervical back: Normal range of motion and neck supple.     Right lower leg: No edema.     Left lower leg: No edema.  Skin:    General: Skin is warm and dry.  Neurological:     Mental Status: He is oriented to person, place, and time.  Psychiatric:        Behavior: Behavior normal.        Thought Content: Thought content normal.        Judgment: Judgment normal.    BP (!) 190/100 (BP Location: Right  Arm, Patient Position: Sitting, Cuff Size: Large)   Pulse 61   Temp 99 F (37.2 C) (Oral)   Resp 18   Ht 6\' 1"  (1.854 m)   Wt 241 lb (109.3 kg)   SpO2 96%   BMI 31.80 kg/m  Wt Readings from Last 3 Encounters:  06/23/20 241 lb (109.3 kg)  05/05/20 (!) 241 lb (109.3 kg)  10/26/19 230 lb 3.2 oz (104.4 kg)     Lab Results  Component Value Date   WBC 4.1 05/05/2020   HGB 12.8 (L) 05/05/2020   HCT 38.0 (L) 05/05/2020   PLT 229.0 05/05/2020   GLUCOSE 92 05/05/2020   CHOL 206 (H) 05/05/2020   TRIG 154.0 (H) 05/05/2020   HDL 38.90 (L) 05/05/2020   LDLCALC 136 (H) 05/05/2020   ALT 18 05/05/2020   AST 21 05/05/2020   NA 141 05/05/2020   K 3.3 (L) 05/05/2020   CL 105 05/05/2020   CREATININE 1.43 05/05/2020   BUN 16 05/05/2020   CO2 30 05/05/2020   TSH 1.78 05/05/2020   PSA 2.35 05/05/2020   MICROALBUR 2.2 (H) 02/17/2013    No results found.   Assessment & Plan:  Plan  I am having Bobby Neer. start on doxazosin. I am also having him maintain his sildenafil, amLODipine, metoprolol succinate, and atorvastatin. We will continue to administer sodium chloride and sodium chloride.  Meds ordered this encounter  Medications  . doxazosin (CARDURA) 1 MG tablet    Sig: Take 1 tablet (1 mg total) by mouth daily.    Dispense:  30 tablet    Refill:  2    Problem List Items Addressed This Visit      Unprioritized   Chest pain    Refer to cardiology ekg --- brady, first degree av block      Relevant Orders   EKG 12-Lead (Completed)   ECHOCARDIOGRAM COMPLETE   Ambulatory referral to Cardiology   Essential hypertension - Primary    Poorly controlled will alter medications, encouraged DASH diet, minimize caffeine and obtain adequate sleep. Report concerning symptoms and follow up as directed and as needed Add cardura 1 mg and f/u 2-3 weeks        Relevant Medications   doxazosin (CARDURA) 1 MG tablet   Other Relevant Orders   ECHOCARDIOGRAM COMPLETE   Ambulatory  referral to Cardiology    Other Visit Diagnoses    Need for influenza vaccination       Relevant Orders   Flu Vaccine QUAD 36+ mos IM      Follow-up: Return in about 2 weeks (around 07/07/2020), or if symptoms worsen or fail to improve, for hypertension.  Ann Held, DO

## 2020-06-23 NOTE — Patient Instructions (Signed)
DASH Eating Plan DASH stands for "Dietary Approaches to Stop Hypertension." The DASH eating plan is a healthy eating plan that has been shown to reduce high blood pressure (hypertension). It may also reduce your risk for type 2 diabetes, heart disease, and stroke. The DASH eating plan may also help with weight loss. What are tips for following this plan?  General guidelines  Avoid eating more than 2,300 mg (milligrams) of salt (sodium) a day. If you have hypertension, you may need to reduce your sodium intake to 1,500 mg a day.  Limit alcohol intake to no more than 1 drink a day for nonpregnant women and 2 drinks a day for men. One drink equals 12 oz of beer, 5 oz of wine, or 1 oz of hard liquor.  Work with your health care provider to maintain a healthy body weight or to lose weight. Ask what an ideal weight is for you.  Get at least 30 minutes of exercise that causes your heart to beat faster (aerobic exercise) most days of the week. Activities may include walking, swimming, or biking.  Work with your health care provider or diet and nutrition specialist (dietitian) to adjust your eating plan to your individual calorie needs. Reading food labels   Check food labels for the amount of sodium per serving. Choose foods with less than 5 percent of the Daily Value of sodium. Generally, foods with less than 300 mg of sodium per serving fit into this eating plan.  To find whole grains, look for the word "whole" as the first word in the ingredient list. Shopping  Buy products labeled as "low-sodium" or "no salt added."  Buy fresh foods. Avoid canned foods and premade or frozen meals. Cooking  Avoid adding salt when cooking. Use salt-free seasonings or herbs instead of table salt or sea salt. Check with your health care provider or pharmacist before using salt substitutes.  Do not fry foods. Cook foods using healthy methods such as baking, boiling, grilling, and broiling instead.  Cook with  heart-healthy oils, such as olive, canola, soybean, or sunflower oil. Meal planning  Eat a balanced diet that includes: ? 5 or more servings of fruits and vegetables each day. At each meal, try to fill half of your plate with fruits and vegetables. ? Up to 6-8 servings of whole grains each day. ? Less than 6 oz of lean meat, poultry, or fish each day. A 3-oz serving of meat is about the same size as a deck of cards. One egg equals 1 oz. ? 2 servings of low-fat dairy each day. ? A serving of nuts, seeds, or beans 5 times each week. ? Heart-healthy fats. Healthy fats called Omega-3 fatty acids are found in foods such as flaxseeds and coldwater fish, like sardines, salmon, and mackerel.  Limit how much you eat of the following: ? Canned or prepackaged foods. ? Food that is high in trans fat, such as fried foods. ? Food that is high in saturated fat, such as fatty meat. ? Sweets, desserts, sugary drinks, and other foods with added sugar. ? Full-fat dairy products.  Do not salt foods before eating.  Try to eat at least 2 vegetarian meals each week.  Eat more home-cooked food and less restaurant, buffet, and fast food.  When eating at a restaurant, ask that your food be prepared with less salt or no salt, if possible. What foods are recommended? The items listed may not be a complete list. Talk with your dietitian about   what dietary choices are best for you. Grains Whole-grain or whole-wheat bread. Whole-grain or whole-wheat pasta. Brown rice. Oatmeal. Quinoa. Bulgur. Whole-grain and low-sodium cereals. Pita bread. Low-fat, low-sodium crackers. Whole-wheat flour tortillas. Vegetables Fresh or frozen vegetables (raw, steamed, roasted, or grilled). Low-sodium or reduced-sodium tomato and vegetable juice. Low-sodium or reduced-sodium tomato sauce and tomato paste. Low-sodium or reduced-sodium canned vegetables. Fruits All fresh, dried, or frozen fruit. Canned fruit in natural juice (without  added sugar). Meat and other protein foods Skinless chicken or turkey. Ground chicken or turkey. Pork with fat trimmed off. Fish and seafood. Egg whites. Dried beans, peas, or lentils. Unsalted nuts, nut butters, and seeds. Unsalted canned beans. Lean cuts of beef with fat trimmed off. Low-sodium, lean deli meat. Dairy Low-fat (1%) or fat-free (skim) milk. Fat-free, low-fat, or reduced-fat cheeses. Nonfat, low-sodium ricotta or cottage cheese. Low-fat or nonfat yogurt. Low-fat, low-sodium cheese. Fats and oils Soft margarine without trans fats. Vegetable oil. Low-fat, reduced-fat, or light mayonnaise and salad dressings (reduced-sodium). Canola, safflower, olive, soybean, and sunflower oils. Avocado. Seasoning and other foods Herbs. Spices. Seasoning mixes without salt. Unsalted popcorn and pretzels. Fat-free sweets. What foods are not recommended? The items listed may not be a complete list. Talk with your dietitian about what dietary choices are best for you. Grains Baked goods made with fat, such as croissants, muffins, or some breads. Dry pasta or rice meal packs. Vegetables Creamed or fried vegetables. Vegetables in a cheese sauce. Regular canned vegetables (not low-sodium or reduced-sodium). Regular canned tomato sauce and paste (not low-sodium or reduced-sodium). Regular tomato and vegetable juice (not low-sodium or reduced-sodium). Pickles. Olives. Fruits Canned fruit in a light or heavy syrup. Fried fruit. Fruit in cream or butter sauce. Meat and other protein foods Fatty cuts of meat. Ribs. Fried meat. Bacon. Sausage. Bologna and other processed lunch meats. Salami. Fatback. Hotdogs. Bratwurst. Salted nuts and seeds. Canned beans with added salt. Canned or smoked fish. Whole eggs or egg yolks. Chicken or turkey with skin. Dairy Whole or 2% milk, cream, and half-and-half. Whole or full-fat cream cheese. Whole-fat or sweetened yogurt. Full-fat cheese. Nondairy creamers. Whipped toppings.  Processed cheese and cheese spreads. Fats and oils Butter. Stick margarine. Lard. Shortening. Ghee. Bacon fat. Tropical oils, such as coconut, palm kernel, or palm oil. Seasoning and other foods Salted popcorn and pretzels. Onion salt, garlic salt, seasoned salt, table salt, and sea salt. Worcestershire sauce. Tartar sauce. Barbecue sauce. Teriyaki sauce. Soy sauce, including reduced-sodium. Steak sauce. Canned and packaged gravies. Fish sauce. Oyster sauce. Cocktail sauce. Horseradish that you find on the shelf. Ketchup. Mustard. Meat flavorings and tenderizers. Bouillon cubes. Hot sauce and Tabasco sauce. Premade or packaged marinades. Premade or packaged taco seasonings. Relishes. Regular salad dressings. Where to find more information:  National Heart, Lung, and Blood Institute: www.nhlbi.nih.gov  American Heart Association: www.heart.org Summary  The DASH eating plan is a healthy eating plan that has been shown to reduce high blood pressure (hypertension). It may also reduce your risk for type 2 diabetes, heart disease, and stroke.  With the DASH eating plan, you should limit salt (sodium) intake to 2,300 mg a day. If you have hypertension, you may need to reduce your sodium intake to 1,500 mg a day.  When on the DASH eating plan, aim to eat more fresh fruits and vegetables, whole grains, lean proteins, low-fat dairy, and heart-healthy fats.  Work with your health care provider or diet and nutrition specialist (dietitian) to adjust your eating plan to your   individual calorie needs. This information is not intended to replace advice given to you by your health care provider. Make sure you discuss any questions you have with your health care provider. Document Revised: 09/06/2017 Document Reviewed: 09/17/2016 Elsevier Patient Education  2020 Elsevier Inc.  

## 2020-06-23 NOTE — Assessment & Plan Note (Signed)
Refer to cardiology ekg --- brady, first degree av block

## 2020-06-23 NOTE — Assessment & Plan Note (Signed)
Poorly controlled will alter medications, encouraged DASH diet, minimize caffeine and obtain adequate sleep. Report concerning symptoms and follow up as directed and as needed Add cardura 1 mg and f/u 2-3 weeks

## 2020-06-29 ENCOUNTER — Other Ambulatory Visit: Payer: Self-pay | Admitting: Cardiology

## 2020-06-29 ENCOUNTER — Encounter: Payer: Self-pay | Admitting: *Deleted

## 2020-06-29 ENCOUNTER — Encounter: Payer: Self-pay | Admitting: Cardiology

## 2020-06-29 ENCOUNTER — Other Ambulatory Visit: Payer: Self-pay

## 2020-06-29 ENCOUNTER — Ambulatory Visit: Payer: BC Managed Care – PPO | Admitting: Cardiology

## 2020-06-29 VITALS — BP 184/102 | HR 55 | Ht 73.0 in | Wt 240.0 lb

## 2020-06-29 DIAGNOSIS — Z79899 Other long term (current) drug therapy: Secondary | ICD-10-CM | POA: Diagnosis not present

## 2020-06-29 DIAGNOSIS — R079 Chest pain, unspecified: Secondary | ICD-10-CM

## 2020-06-29 DIAGNOSIS — I10 Essential (primary) hypertension: Secondary | ICD-10-CM | POA: Diagnosis not present

## 2020-06-29 DIAGNOSIS — E785 Hyperlipidemia, unspecified: Secondary | ICD-10-CM | POA: Diagnosis not present

## 2020-06-29 MED ORDER — NIFEDIPINE ER OSMOTIC RELEASE 60 MG PO TB24: 60.0000 mg | ORAL_TABLET | Freq: Every day | ORAL | 3 refills | Status: DC

## 2020-06-29 MED ORDER — HYDROCHLOROTHIAZIDE 25 MG PO TABS: 25.0000 mg | ORAL_TABLET | Freq: Every day | ORAL | 3 refills | Status: DC

## 2020-06-29 MED FILL — HYDROCHLOROTHIAZIDE 25 MG T: 25 | 30 days supply | Qty: 30 | Fill #0

## 2020-06-29 MED FILL — NIFEDIPINE ER OSMOTIC RELEA: 60 | 30 days supply | Qty: 30 | Fill #0

## 2020-06-29 NOTE — Patient Instructions (Signed)
Medication Instructions:  Your physician has recommended you make the following change in your medication:  1. STOP Amlodipine (Norvasc) 2. START Nifedipine (Procardia) 60 mg once daily 3. START Hydrochlorothiazide 25 mg once daily  *If you need a refill on your cardiac medications before your next appointment, please call your pharmacy*   Lab Work: Today: BMET  Your physician recommends that you return for lab work in: 2 weeks for repeat BMET  If you have labs (blood work) drawn today and your tests are completely normal, you will receive your results only by: Marland Kitchen MyChart Message (if you have MyChart) OR . A paper copy in the mail If you have any lab test that is abnormal or we need to change your treatment, we will call you to review the results.   Testing/Procedures: Your physician has requested that you have en exercise stress myoview. For further information please visit [email or web address]. Please follow instruction sheet, as given.   Follow-Up: At [facility], you and your health needs are our priority.  As part of our continuing mission to provide you with exceptional heart care, we have created designated Provider Care Teams.  These Care Teams include your primary Cardiologist (physician) and Advanced Practice Providers (APPs -  Physician Assistants and Nurse Practitioners) who all work together to provide you with the care you need, when you need it.  We recommend signing up for the patient portal called "MyChart".  Sign up information is provided on this After Visit Summary.  MyChart is used to connect with patients for Virtual Visits (Telemedicine).  Patients are able to view lab/test results, encounter notes, upcoming appointments, etc.  Non-urgent messages can be sent to your provider as well.   To learn more about what you can do with MyChart, go to [email or web address].    You have been referred to HTN clinic   Your next appointment:   3 month(s)  The format  for your next appointment:   In Person  Provider:   Lars Mage, MD   Other Instructions

## 2020-06-29 NOTE — Progress Notes (Signed)
Electrophysiology Office Note:    Date:  06/29/2020   ID:  Bobby Neer., DOB [birth year], MRN [id number]  PCP:  Carollee Herter, [place] Cardiologist:  No primary care provider on file.  [facility] Electrophysiologist:  None   Referring MD: Ann Held, *   Chief Complaint: Chest pain  History of Present Illness:    Bobby Kertz. is a 54 y.o. male who presents for an evaluation of chest pain at the request of Dr. Carollee Herter. Their medical history includes hypertension, hyperlipidemia, sleep apnea on CPAP.  He has chest pain that is centrally located and occurs both at rest and with exertion. He has been concerned and has stopped exercising due to the symptoms.  He also has a frequent headache that he has noticed worsening in the setting of elevated blood pressures. He takes he BP at home and has noted elevated readings (409W systolic). He previously took lisinopril but had angioedema so this was stopped. He has a very strong family history of HTN and heart disease. He has a son who is healthy and athletic. He enjoys exercising and is eager to return to his previous activity levels.  Past Medical History:  Diagnosis Date  . Allergy   . Bell's palsy    History  . Hyperlipidemia   . Hypertension   . Sleep apnea    uses CPAP nightly    Past Surgical History:  Procedure Laterality Date  . ADENOIDECTOMY  08/2011   [place]--- gso ent  . CLOSED REDUCTION HAND FRACTURE  12/2010   Right hand  . COLONOSCOPY    . KNEE SURGERY     bilat scops  . POLYPECTOMY      Current Medications: No outpatient medications have been marked as taking for the 06/29/20 encounter (Appointment) with Vickie Epley, MD.   Current Facility-Administered Medications for the 06/29/20 encounter (Appointment) with Vickie Epley, MD  Medication  . 0.9 %  sodium chloride infusion  . 0.9 %  sodium chloride infusion     Allergies:   Ace inhibitors   Social  History   Socioeconomic History  . Marital status: Married    Spouse name: Not on file  . Number of children: Y  . Years of education: Not on file  . Highest education level: Not on file  Occupational History  . Occupation: combine Optician, dispensing: Engineer, building services   . Occupation: Control and instrumentation engineer  Tobacco Use  . Smoking status: Never Smoker  . Smokeless tobacco: Never Used  Vaping Use  . Vaping Use: Never used  Substance and Sexual Activity  . Alcohol use: No    Alcohol/week: 0.0 standard drinks  . Drug use: No  . Sexual activity: Yes    Partners: Male  Other Topics Concern  . Not on file  Social History Narrative   Regular Exercise- qd    Social Determinants of Health   Financial Resource Strain:   . Difficulty of Paying Living Expenses: Not on file  Food Insecurity:   . Worried About Charity fundraiser in the Last Year: Not on file  . Ran Out of Food in the Last Year: Not on file  Transportation Needs:   . Lack of Transportation (Medical): Not on file  . Lack of Transportation (Non-Medical): Not on file  Physical Activity:   . Days of Exercise per Week: Not on file  . Minutes of Exercise per Session: Not on file  Stress:   . Feeling of Stress : Not on file  Social Connections:   . Frequency of Communication with Friends and Family: Not on file  . Frequency of Social Gatherings with Friends and Family: Not on file  . Attends Religious Services: Not on file  . Active Member of Clubs or Organizations: Not on file  . Attends Archivist Meetings: Not on file  . Marital Status: Not on file     Family History: The patient's family history includes Alzheimer's disease in his maternal grandmother; Brain cancer in his sister; Breast cancer in his maternal aunt and mother; Cancer in his father; Cancer (age of onset: 28) in his maternal aunt; Cancer (age of onset: 26) in his mother; Colon cancer in his father; Heart disease in his maternal grandfather  and maternal uncle; Hyperlipidemia in his father and maternal grandfather; Hypertension in his father, maternal grandfather, maternal grandmother, maternal uncle, maternal uncle, mother, sister, and another family member; Stroke in his father, maternal grandfather, and maternal uncle. There is no history of Colon polyps, Rectal cancer, Stomach cancer, or Esophageal cancer.  ROS:   Please see the history of present illness.    All other systems reviewed and are negative.  EKGs/Labs/Other Studies Reviewed:    The following studies were reviewed today: ECG  June 23, 2020 ECG personally reviewed Sinus rhythm.  Nonspecific T wave changes in the precordial leads.  EKG:  The ekg ordered today demonstrates sinus rhythm.  Recent Labs: 05/05/2020: ALT 18; BUN 16; Creatinine, Ser 1.43; Hemoglobin 12.8; Platelets 229.0; Potassium 3.3; Sodium 141; TSH 1.78  Recent Lipid Panel    Component Value Date/Time   CHOL 206 (H) 05/05/2020 1526   TRIG 154.0 (H) 05/05/2020 1526   TRIG 59 08/15/2006 1121   HDL 38.90 (L) 05/05/2020 1526   CHOLHDL 5 05/05/2020 1526   VLDL 30.8 05/05/2020 1526   LDLCALC 136 (H) 05/05/2020 1526   LDLCALC 82 01/09/2019 1509    Physical Exam:    VS:  There were no vitals taken for this visit.    Wt Readings from Last 3 Encounters:  06/23/20 241 lb (109.3 kg)  05/05/20 (!) 241 lb (109.3 kg)  10/26/19 230 lb 3.2 oz (104.4 kg)     GEN:  Well nourished, well developed in no acute distress HEENT: Normal NECK: No JVD; No carotid bruits LYMPHATICS: No lymphadenopathy CARDIAC: RRR, no murmurs, rubs, gallops RESPIRATORY:  Clear to auscultation without rales, wheezing or rhonchi  ABDOMEN: Soft, non-tender, non-distended MUSCULOSKELETAL:  No edema; No deformity  SKIN: Warm and dry NEUROLOGIC:  Alert and oriented x 3 PSYCHIATRIC:  Normal affect   ASSESSMENT:    1. Chest pain, unspecified type   2. Essential hypertension   3. Hyperlipidemia, unspecified  hyperlipidemia type    PLAN:    In order of problems listed above:  1. Chest pain He is at risk of coronary disease given his family history, HTN. I would like to order an exercise nuclear stress to assess for coronary disease and to quantify his exertional capacity. Will work on optimizing his BP control as well.   2. Hypertension Not in control. 100F systolic today which mirrors his home recordings. Will change his amlodipine to nifedipine 60mg  daily. Start HCTZ 25mg  daily with lab work today and in 2 weeks. Consider changing metoprolol to coreg at next visit if still out of range. Refer to HTN clinic.  3. Hyperlipidemia Continue home statin therapy.    Medication Adjustments/Labs and Tests  Ordered: Current medicines are reviewed at length with the patient today.  Concerns regarding medicines are outlined above.  No orders of the defined types were placed in this encounter.  No orders of the defined types were placed in this encounter.    Signed, Bobby Mage, MD, [facility]  06/29/2020 8:30 AM    Electrophysiology [place]

## 2020-06-30 ENCOUNTER — Telehealth: Payer: Self-pay | Admitting: Cardiology

## 2020-06-30 ENCOUNTER — Telehealth: Payer: Self-pay

## 2020-06-30 LAB — BASIC METABOLIC PANEL
BUN/Creatinine Ratio: 11 (ref 9–20)
BUN: 15 mg/dL (ref 6–24)
CO2: 28 mmol/L (ref 20–29)
Calcium: 9.7 mg/dL (ref 8.7–10.2)
Chloride: 102 mmol/L (ref 96–106)
Creatinine, Ser: 1.33 mg/dL — ABNORMAL HIGH (ref 0.76–1.27)
GFR calc Af Amer: 69 mL/min/{1.73_m2} (ref >59)
GFR calc non Af Amer: 60 mL/min/{1.73_m2} (ref >59)
Glucose: 97 mg/dL (ref 65–99)
Potassium: 3.6 mmol/L (ref 3.5–5.2)
Sodium: 141 mmol/L (ref 134–144)

## 2020-06-30 MED ORDER — POTASSIUM CHLORIDE CRYS ER 20 MEQ PO TBCR: 20.0000 meq | EXTENDED_RELEASE_TABLET | Freq: Every day | ORAL | 3 refills | Status: DC

## 2020-06-30 MED FILL — POTASSIUM CHLORIDE CRYS ER: 20 | 90 days supply | Qty: 90 | Fill #0

## 2020-06-30 NOTE — Telephone Encounter (Signed)
Pt c/o medication issue:  1. Name of Medication:   hydrochlorothiazide (HYDRODIURIL) 25 MG tablet  NIFEdipine (PROCARDIA XL/NIFEDICAL XL) 60 MG 24 hr tablet   2. How are you currently taking this medication (dosage and times per day)? As directed  3. Are you having a reaction (difficulty breathing--STAT)? yes  4. What is your medication issue? Patient states that since his medication change from yesterday his BP has been high, 180/105 and his head is killing him. Please advise if changes need to be made.

## 2020-06-30 NOTE — Telephone Encounter (Signed)
See my chart response .

## 2020-06-30 NOTE — Telephone Encounter (Signed)
Per Dr. Sylvester Harder potassium 20 meq one tablet by mouth daily  Sent to pharmacy Pt notified via mychart.

## 2020-07-07 ENCOUNTER — Telehealth (HOSPITAL_COMMUNITY): Payer: Self-pay | Admitting: *Deleted

## 2020-07-07 ENCOUNTER — Encounter (HOSPITAL_COMMUNITY): Payer: Self-pay | Admitting: *Deleted

## 2020-07-07 ENCOUNTER — Encounter: Payer: Self-pay | Admitting: Family Medicine

## 2020-07-07 ENCOUNTER — Ambulatory Visit (INDEPENDENT_AMBULATORY_CARE_PROVIDER_SITE_OTHER): Payer: BC Managed Care – PPO | Admitting: Family Medicine

## 2020-07-07 ENCOUNTER — Other Ambulatory Visit: Payer: Self-pay

## 2020-07-07 VITALS — BP 118/68 | HR 72 | Temp 98.4°F | Resp 18 | Ht 73.0 in | Wt 236.6 lb

## 2020-07-07 DIAGNOSIS — I10 Essential (primary) hypertension: Secondary | ICD-10-CM

## 2020-07-07 DIAGNOSIS — Z23 Encounter for immunization: Secondary | ICD-10-CM

## 2020-07-07 NOTE — Telephone Encounter (Signed)
Patient given detailed instructions per Myocardial Perfusion Study Information Sheet for the test on 07/12/20. Patient notified to arrive 15 minutes early and that it is imperative to arrive on time for appointment to keep from having the test rescheduled.  If you need to cancel or reschedule your appointment, please call the office within 24 hours of your appointment. . Patient verbalized understanding. [name]

## 2020-07-07 NOTE — Progress Notes (Signed)
Patient ID: Bobby Neer., male    DOB: 1966-09-27  Age: 54 y.o. MRN: [id number]    Subjective:  Subjective  HPI Bobby Castaneda. presents for f/u bp.   He saw cardiology and stress test is being done Tuesday   No more cp / headaches  bp running high at home   Review of Systems  Constitutional: Negative for appetite change, diaphoresis, fatigue and unexpected weight change.  Eyes: Negative for pain, redness and visual disturbance.  Respiratory: Negative for cough, chest tightness, shortness of breath and wheezing.   Cardiovascular: Negative for chest pain, palpitations and leg swelling.  Endocrine: Negative for cold intolerance, heat intolerance, polydipsia, polyphagia and polyuria.  Genitourinary: Negative for difficulty urinating, dysuria and frequency.  Neurological: Negative for dizziness, light-headedness, numbness and headaches.    History Past Medical History:  Diagnosis Date  . Allergy   . Bell's palsy    History  . Hyperlipidemia   . Hypertension   . Sleep apnea    uses CPAP nightly    He has a past surgical history that includes Knee surgery; Adenoidectomy (08/2011); Closed reduction hand fracture (12/2010); Colonoscopy; and Polypectomy.   His family history includes Alzheimer's disease in his maternal grandmother; Brain cancer in his sister; Breast cancer in his maternal aunt and mother; Cancer in his father; Cancer (age of onset: 14) in his maternal aunt; Cancer (age of onset: 71) in his mother; Colon cancer in his father; Heart disease in his maternal grandfather and maternal uncle; Hyperlipidemia in his father and maternal grandfather; Hypertension in his father, maternal grandfather, maternal grandmother, maternal uncle, maternal uncle, mother, sister, and another family member; Stroke in his father, maternal grandfather, and maternal uncle.He reports that he has never smoked. He has never used smokeless tobacco. He reports that he does not drink alcohol and does  not use drugs.  Current Outpatient Medications on File Prior to Visit  Medication Sig Dispense Refill  . atorvastatin (LIPITOR) 40 MG tablet Take 1 tablet (40 mg total) by mouth at bedtime. 90 tablet 1  . doxazosin (CARDURA) 1 MG tablet Take 1 tablet (1 mg total) by mouth daily. 30 tablet 2  . hydrochlorothiazide (HYDRODIURIL) 25 MG tablet Take 1 tablet (25 mg total) by mouth daily. 30 tablet 3  . metoprolol succinate (TOPROL-XL) 25 MG 24 hr tablet TAKE 1 TABLET (25 MG TOTAL) BY MOUTH DAILY. 90 tablet 1  . NIFEdipine (PROCARDIA XL/NIFEDICAL XL) 60 MG 24 hr tablet Take 1 tablet (60 mg total) by mouth daily. 30 tablet 3  . potassium chloride SA (KLOR-CON) 20 MEQ tablet Take 1 tablet (20 mEq total) by mouth daily. 90 tablet 3  . sildenafil (VIAGRA) 100 MG tablet Take 0.5-1 tablets (50-100 mg total) by mouth daily as needed for erectile dysfunction. 10 tablet 11   Current Facility-Administered Medications on File Prior to Visit  Medication Dose Route Frequency Provider Last Rate Last Admin  . 0.9 %  sodium chloride infusion  500 mL Intravenous Continuous Irene Shipper, MD      . 0.9 %  sodium chloride infusion  500 mL Intravenous Once Irene Shipper, MD         Objective:  Objective  Physical Exam Vitals and nursing note reviewed.  Constitutional:      General: He is awake.     Appearance: He is well-developed.  HENT:     Head: Normocephalic and atraumatic.  Eyes:     Pupils: Pupils are equal, round, and reactive  to light.  Neck:     Thyroid: No thyromegaly.  Cardiovascular:     Rate and Rhythm: Normal rate and regular rhythm.     Heart sounds: No murmur heard.   Pulmonary:     Effort: Pulmonary effort is normal. No respiratory distress.     Breath sounds: Normal breath sounds. No wheezing or rales.  Chest:     Chest wall: No tenderness.  Musculoskeletal:        General: No tenderness.     Cervical back: Normal range of motion and neck supple.  Skin:    General: Skin is warm  and dry.  Neurological:     Mental Status: He is alert and oriented to person, place, and time.  Psychiatric:        Behavior: Behavior normal. Behavior is cooperative.        Thought Content: Thought content normal.        Judgment: Judgment normal.    BP 118/68 (BP Location: Right Arm, Cuff Size: Normal)   Pulse 72   Temp 98.4 F (36.9 C) (Oral)   Resp 18   Ht 6\' 1"  (1.854 m)   Wt 236 lb 9.6 oz (107.3 kg)   SpO2 96%   BMI 31.22 kg/m  Wt Readings from Last 3 Encounters:  07/07/20 236 lb 9.6 oz (107.3 kg)  06/29/20 240 lb (108.9 kg)  06/23/20 241 lb (109.3 kg)     Lab Results  Component Value Date   WBC 4.1 05/05/2020   HGB 12.8 (L) 05/05/2020   HCT 38.0 (L) 05/05/2020   PLT 229.0 05/05/2020   GLUCOSE 97 06/29/2020   CHOL 206 (H) 05/05/2020   TRIG 154.0 (H) 05/05/2020   HDL 38.90 (L) 05/05/2020   LDLCALC 136 (H) 05/05/2020   ALT 18 05/05/2020   AST 21 05/05/2020   NA 141 06/29/2020   K 3.6 06/29/2020   CL 102 06/29/2020   CREATININE 1.33 (H) 06/29/2020   BUN 15 06/29/2020   CO2 28 06/29/2020   TSH 1.78 05/05/2020   PSA 2.35 05/05/2020   MICROALBUR 2.2 (H) 02/17/2013    No results found.   Assessment & Plan:  Plan  I am having Bobby Neer. maintain his sildenafil, metoprolol succinate, atorvastatin, doxazosin, NIFEdipine, hydrochlorothiazide, and potassium chloride SA. We will continue to administer sodium chloride and sodium chloride.  No orders of the defined types were placed in this encounter.   Problem List Items Addressed This Visit      Unprioritized   Essential hypertension - Primary    Other Visit Diagnoses    Need for influenza vaccination          Follow-up: Return in about 4 months (around 11/06/2020) for hypertension, hyperlipidemia.  Ann Held, DO

## 2020-07-07 NOTE — Patient Instructions (Signed)
DASH Eating Plan DASH stands for "Dietary Approaches to Stop Hypertension." The DASH eating plan is a healthy eating plan that has been shown to reduce high blood pressure (hypertension). It may also reduce your risk for type 2 diabetes, heart disease, and stroke. The DASH eating plan may also help with weight loss. What are tips for following this plan?  General guidelines  Avoid eating more than 2,300 mg (milligrams) of salt (sodium) a day. If you have hypertension, you may need to reduce your sodium intake to 1,500 mg a day.  Limit alcohol intake to no more than 1 drink a day for nonpregnant women and 2 drinks a day for men. One drink equals 12 oz of beer, 5 oz of wine, or 1 oz of hard liquor.  Work with your health care provider to maintain a healthy body weight or to lose weight. Ask what an ideal weight is for you.  Get at least 30 minutes of exercise that causes your heart to beat faster (aerobic exercise) most days of the week. Activities may include walking, swimming, or biking.  Work with your health care provider or diet and nutrition specialist (dietitian) to adjust your eating plan to your individual calorie needs. Reading food labels   Check food labels for the amount of sodium per serving. Choose foods with less than 5 percent of the Daily Value of sodium. Generally, foods with less than 300 mg of sodium per serving fit into this eating plan.  To find whole grains, look for the word "whole" as the first word in the ingredient list. Shopping  Buy products labeled as "low-sodium" or "no salt added."  Buy fresh foods. Avoid canned foods and premade or frozen meals. Cooking  Avoid adding salt when cooking. Use salt-free seasonings or herbs instead of table salt or sea salt. Check with your health care provider or pharmacist before using salt substitutes.  Do not fry foods. Cook foods using healthy methods such as baking, boiling, grilling, and broiling instead.  Cook with  heart-healthy oils, such as olive, canola, soybean, or sunflower oil. Meal planning  Eat a balanced diet that includes: ? 5 or more servings of fruits and vegetables each day. At each meal, try to fill half of your plate with fruits and vegetables. ? Up to 6-8 servings of whole grains each day. ? Less than 6 oz of lean meat, poultry, or fish each day. A 3-oz serving of meat is about the same size as a deck of cards. One egg equals 1 oz. ? 2 servings of low-fat dairy each day. ? A serving of nuts, seeds, or beans 5 times each week. ? Heart-healthy fats. Healthy fats called Omega-3 fatty acids are found in foods such as flaxseeds and coldwater fish, like sardines, salmon, and mackerel.  Limit how much you eat of the following: ? Canned or prepackaged foods. ? Food that is high in trans fat, such as fried foods. ? Food that is high in saturated fat, such as fatty meat. ? Sweets, desserts, sugary drinks, and other foods with added sugar. ? Full-fat dairy products.  Do not salt foods before eating.  Try to eat at least 2 vegetarian meals each week.  Eat more home-cooked food and less restaurant, buffet, and fast food.  When eating at a restaurant, ask that your food be prepared with less salt or no salt, if possible. What foods are recommended? The items listed may not be a complete list. Talk with your dietitian about   what dietary choices are best for you. Grains Whole-grain or whole-wheat bread. Whole-grain or whole-wheat pasta. Brown rice. Oatmeal. Quinoa. Bulgur. Whole-grain and low-sodium cereals. Pita bread. Low-fat, low-sodium crackers. Whole-wheat flour tortillas. Vegetables Fresh or frozen vegetables (raw, steamed, roasted, or grilled). Low-sodium or reduced-sodium tomato and vegetable juice. Low-sodium or reduced-sodium tomato sauce and tomato paste. Low-sodium or reduced-sodium canned vegetables. Fruits All fresh, dried, or frozen fruit. Canned fruit in natural juice (without  added sugar). Meat and other protein foods Skinless chicken or turkey. Ground chicken or turkey. Pork with fat trimmed off. Fish and seafood. Egg whites. Dried beans, peas, or lentils. Unsalted nuts, nut butters, and seeds. Unsalted canned beans. Lean cuts of beef with fat trimmed off. Low-sodium, lean deli meat. Dairy Low-fat (1%) or fat-free (skim) milk. Fat-free, low-fat, or reduced-fat cheeses. Nonfat, low-sodium ricotta or cottage cheese. Low-fat or nonfat yogurt. Low-fat, low-sodium cheese. Fats and oils Soft margarine without trans fats. Vegetable oil. Low-fat, reduced-fat, or light mayonnaise and salad dressings (reduced-sodium). Canola, safflower, olive, soybean, and sunflower oils. Avocado. Seasoning and other foods Herbs. Spices. Seasoning mixes without salt. Unsalted popcorn and pretzels. Fat-free sweets. What foods are not recommended? The items listed may not be a complete list. Talk with your dietitian about what dietary choices are best for you. Grains Baked goods made with fat, such as croissants, muffins, or some breads. Dry pasta or rice meal packs. Vegetables Creamed or fried vegetables. Vegetables in a cheese sauce. Regular canned vegetables (not low-sodium or reduced-sodium). Regular canned tomato sauce and paste (not low-sodium or reduced-sodium). Regular tomato and vegetable juice (not low-sodium or reduced-sodium). Pickles. Olives. Fruits Canned fruit in a light or heavy syrup. Fried fruit. Fruit in cream or butter sauce. Meat and other protein foods Fatty cuts of meat. Ribs. Fried meat. Bacon. Sausage. Bologna and other processed lunch meats. Salami. Fatback. Hotdogs. Bratwurst. Salted nuts and seeds. Canned beans with added salt. Canned or smoked fish. Whole eggs or egg yolks. Chicken or turkey with skin. Dairy Whole or 2% milk, cream, and half-and-half. Whole or full-fat cream cheese. Whole-fat or sweetened yogurt. Full-fat cheese. Nondairy creamers. Whipped toppings.  Processed cheese and cheese spreads. Fats and oils Butter. Stick margarine. Lard. Shortening. Ghee. Bacon fat. Tropical oils, such as coconut, palm kernel, or palm oil. Seasoning and other foods Salted popcorn and pretzels. Onion salt, garlic salt, seasoned salt, table salt, and sea salt. Worcestershire sauce. Tartar sauce. Barbecue sauce. Teriyaki sauce. Soy sauce, including reduced-sodium. Steak sauce. Canned and packaged gravies. Fish sauce. Oyster sauce. Cocktail sauce. Horseradish that you find on the shelf. Ketchup. Mustard. Meat flavorings and tenderizers. Bouillon cubes. Hot sauce and Tabasco sauce. Premade or packaged marinades. Premade or packaged taco seasonings. Relishes. Regular salad dressings. Where to find more information:  National Heart, Lung, and Blood Institute: www.nhlbi.nih.gov  American Heart Association: www.heart.org Summary  The DASH eating plan is a healthy eating plan that has been shown to reduce high blood pressure (hypertension). It may also reduce your risk for type 2 diabetes, heart disease, and stroke.  With the DASH eating plan, you should limit salt (sodium) intake to 2,300 mg a day. If you have hypertension, you may need to reduce your sodium intake to 1,500 mg a day.  When on the DASH eating plan, aim to eat more fresh fruits and vegetables, whole grains, lean proteins, low-fat dairy, and heart-healthy fats.  Work with your health care provider or diet and nutrition specialist (dietitian) to adjust your eating plan to your   individual calorie needs. This information is not intended to replace advice given to you by your health care provider. Make sure you discuss any questions you have with your health care provider. Document Revised: 09/06/2017 Document Reviewed: 09/17/2016 Elsevier Patient Education  2020 Elsevier Inc.  

## 2020-07-07 NOTE — Assessment & Plan Note (Signed)
Well controlled, no changes to meds. Encouraged heart healthy diet such as the DASH diet and exercise as tolerated.  con't metoprolol , nifedipine, cardura and hctz F/u cardiology F/u here Jan 2022 or sooner prn

## 2020-07-08 ENCOUNTER — Other Ambulatory Visit (HOSPITAL_COMMUNITY)
Admission: RE | Admit: 2020-07-08 | Discharge: 2020-07-08 | Disposition: A | Discharge status: Home / Self Care | Payer: BC Managed Care – PPO | Source: Ambulatory Visit | Attending: Cardiology | Admitting: Cardiology

## 2020-07-08 DIAGNOSIS — Z01812 Encounter for preprocedural laboratory examination: Secondary | ICD-10-CM | POA: Insufficient documentation

## 2020-07-08 DIAGNOSIS — Z20822 Contact with and (suspected) exposure to covid-19: Secondary | ICD-10-CM | POA: Insufficient documentation

## 2020-07-08 LAB — SARS CORONAVIRUS 2 (TAT 6-24 HRS): SARS Coronavirus 2: NEGATIVE

## 2020-07-12 ENCOUNTER — Other Ambulatory Visit: Payer: BC Managed Care – PPO | Admitting: *Deleted

## 2020-07-12 ENCOUNTER — Ambulatory Visit (HOSPITAL_COMMUNITY): Payer: BC Managed Care – PPO | Attending: Cardiology

## 2020-07-12 ENCOUNTER — Ambulatory Visit (INDEPENDENT_AMBULATORY_CARE_PROVIDER_SITE_OTHER): Payer: BC Managed Care – PPO | Admitting: Pharmacist

## 2020-07-12 ENCOUNTER — Other Ambulatory Visit: Payer: Self-pay

## 2020-07-12 ENCOUNTER — Encounter: Payer: Self-pay | Admitting: Pharmacist

## 2020-07-12 VITALS — BP 122/78 | HR 81

## 2020-07-12 DIAGNOSIS — R079 Chest pain, unspecified: Secondary | ICD-10-CM | POA: Diagnosis not present

## 2020-07-12 DIAGNOSIS — I10 Essential (primary) hypertension: Secondary | ICD-10-CM

## 2020-07-12 DIAGNOSIS — Z79899 Other long term (current) drug therapy: Secondary | ICD-10-CM

## 2020-07-12 LAB — BASIC METABOLIC PANEL
BUN/Creatinine Ratio: 12 (ref 9–20)
BUN: 16 mg/dL (ref 6–24)
CO2: 29 mmol/L (ref 20–29)
Calcium: 9.7 mg/dL (ref 8.7–10.2)
Chloride: 101 mmol/L (ref 96–106)
Creatinine, Ser: 1.38 mg/dL — ABNORMAL HIGH (ref 0.76–1.27)
GFR calc Af Amer: 66 mL/min/{1.73_m2} (ref >59)
GFR calc non Af Amer: 57 mL/min/{1.73_m2} — ABNORMAL LOW (ref >59)
Glucose: 111 mg/dL — ABNORMAL HIGH (ref 65–99)
Potassium: 3.2 mmol/L — ABNORMAL LOW (ref 3.5–5.2)
Sodium: 141 mmol/L (ref 134–144)

## 2020-07-12 LAB — MYOCARDIAL PERFUSION IMAGING
Estimated workload: 8.9 METS
Exercise duration (min): 7 min
Exercise duration (sec): 15 s
LV dias vol: 118 mL (ref 62–150)
LV sys vol: 51 mL
MPHR: 165 {beats}/min
Peak HR: 142 {beats}/min
Percent HR: 86 %
RPE: 18
Rest HR: 71 {beats}/min
SDS: 1
SRS: 0
SSS: 1
TID: 0.93

## 2020-07-12 MED ORDER — TECHNETIUM TC 99M TETROFOSMIN IV KIT
9.5000 | PACK | Freq: Once | INTRAVENOUS | Status: AC | PRN
  Administered 2020-07-12: 9.5 via INTRAVENOUS
  Filled 2020-07-12: qty 10

## 2020-07-12 MED ORDER — TECHNETIUM TC 99M TETROFOSMIN IV KIT
31.6000 | PACK | Freq: Once | INTRAVENOUS | Status: AC | PRN
  Administered 2020-07-12: 31.6 via INTRAVENOUS
  Filled 2020-07-12: qty 32

## 2020-07-12 NOTE — Patient Instructions (Addendum)
It was good meeting you today!  We would like to keep your blood pressure less than 130/80  Continue:  Nifedipine 60 mg daily Doxasozin 1 mg daily HCTZ 25mg  once daily in the morning Metoprolol 25mg  once daily  Continue healthy eating and increase exercise to 30 minutes a day up to 5 days a week  Call us with any questions!  Karren Cobble, PharmD, BCACP, [facility] [address], [place], [place] [address] Phone: [phone]; Fax: [phone] 07/12/2020 12:23 PM

## 2020-07-12 NOTE — Progress Notes (Signed)
Patient ID: Bobby Castaneda.                 DOB: August 23, 1966                      MRN: [id number]     HPI: Bobby Castaneda. is a 54 y.o. male referred by Dr. Quentin Ore to HTN clinic. PMH is significant for hypertension, hyperlipidemia, sleep apnea on CPAP.  Seen by Dr Quentin Ore on 06/29/20 with complaints of headache and chest pain which is worse when his BP is increased.  Amlodipine was discontinued and patient was started on nifedipine and HCTZ.   Patient scheduled today for nuclear stress test, lab work, and hypertension appointment.  Patient reports BP has been well controlled at his recent visits but he is receiving higher readings at home.  Does not know if his machine is accurate.  Is tolerating medication changes well.  No longer having headaches.     Reports home cuff reading systolic BP in 619J.  Typically takes BP in the morning with his meds.  Would like to start exercising again.    Current HTN meds: nifedipine 60 mg daily, HCTZ 25mg  daily, doxazosin 1 mg daily, metoprolol succinate 25 mg daily Previously tried: lisinopril (angioedema) BP goal: <130/80  Family History: Heart disease in his maternal grandfather and maternal uncle; Hyperlipidemia in his father and maternal grandfather; Hypertension in his father, maternal grandfather, maternal grandmother, maternal uncle, maternal uncle, mother, sister, and another family member; Stroke in his father, maternal grandfather, and maternal uncle  Exercise: have not been able to exercise as much  Wt Readings from Last 3 Encounters:  07/07/20 236 lb 9.6 oz (107.3 kg)  06/29/20 240 lb (108.9 kg)  06/23/20 241 lb (109.3 kg)   BP Readings from Last 3 Encounters:  07/07/20 118/68  06/29/20 (!) 184/102  06/23/20 (!) 190/100   Pulse Readings from Last 3 Encounters:  07/07/20 72  06/29/20 (!) 55  06/23/20 61    Renal function: Estimated Creatinine Clearance: 80.7 mL/min (A) (by C-G formula based on SCr of 1.33 mg/dL (H)).  Past  Medical History:  Diagnosis Date  . Allergy   . Bell's palsy    History  . Hyperlipidemia   . Hypertension   . Sleep apnea    uses CPAP nightly    Current Outpatient Medications on File Prior to Visit  Medication Sig Dispense Refill  . atorvastatin (LIPITOR) 40 MG tablet Take 1 tablet (40 mg total) by mouth at bedtime. 90 tablet 1  . doxazosin (CARDURA) 1 MG tablet Take 1 tablet (1 mg total) by mouth daily. 30 tablet 2  . hydrochlorothiazide (HYDRODIURIL) 25 MG tablet Take 1 tablet (25 mg total) by mouth daily. 30 tablet 3  . metoprolol succinate (TOPROL-XL) 25 MG 24 hr tablet TAKE 1 TABLET (25 MG TOTAL) BY MOUTH DAILY. 90 tablet 1  . NIFEdipine (PROCARDIA XL/NIFEDICAL XL) 60 MG 24 hr tablet Take 1 tablet (60 mg total) by mouth daily. 30 tablet 3  . potassium chloride SA (KLOR-CON) 20 MEQ tablet Take 1 tablet (20 mEq total) by mouth daily. 90 tablet 3  . sildenafil (VIAGRA) 100 MG tablet Take 0.5-1 tablets (50-100 mg total) by mouth daily as needed for erectile dysfunction. 10 tablet 11   Current Facility-Administered Medications on File Prior to Visit  Medication Dose Route Frequency Provider Last Rate Last Admin  . 0.9 %  sodium chloride infusion  500 mL Intravenous Continuous Scarlette Shorts  N, MD      . 0.9 %  sodium chloride infusion  500 mL Intravenous Once Irene Shipper, MD        Allergies  Allergen Reactions  . Ace Inhibitors Other (See Comments)    angioedema     Assessment/Plan:  1. Hypertension - Patient BP in room today 122/78 which is at goal of <130/80.  Discussed proper BP technique with patient and he voices correct technique.  Suggested he take BP readings 1-2 hours after medications. Also recommended he wait for the results of stress test before performing any vigorous exercises.  Since BP at goal today, no medication changes needed.  BMP pending.  Continue HCTZ 25mg  daily Doxazosin 1mg  daily Metoprolol succinate 25mg  daily Nifedipine 60mg  daily  Karren Cobble, PharmD, BCACP, [facility] [address], [place],  [address] Phone: [phone]; Fax: [phone] 07/12/2020 3:32 PM

## 2020-07-13 ENCOUNTER — Other Ambulatory Visit: Payer: Self-pay | Admitting: Cardiology

## 2020-07-13 ENCOUNTER — Encounter (HOSPITAL_COMMUNITY): Payer: BC Managed Care – PPO

## 2020-07-13 ENCOUNTER — Telehealth: Payer: Self-pay

## 2020-07-13 DIAGNOSIS — E876 Hypokalemia: Secondary | ICD-10-CM

## 2020-07-13 MED ORDER — POTASSIUM CHLORIDE CRYS ER 20 MEQ PO TBCR: 40.0000 meq | EXTENDED_RELEASE_TABLET | Freq: Every day | ORAL | 3 refills | Status: DC

## 2020-07-13 NOTE — Telephone Encounter (Signed)
-----   Message from Vickie Epley, MD sent at 07/12/2020  5:47 PM EDT ----- Potassium is low. Can we make sure he is taking his potassium supplement. If he has been taking the potassium, we need to increase to supplement to 72mEq daily of Kdur and recheck labs in 2 weeks.

## 2020-07-13 NOTE — Telephone Encounter (Signed)
Pt advised to increase potassium  Will follow up with lab work on 08/05/20

## 2020-07-15 ENCOUNTER — Ambulatory Visit (HOSPITAL_BASED_OUTPATIENT_CLINIC_OR_DEPARTMENT_OTHER): Payer: BC Managed Care – PPO

## 2020-07-15 ENCOUNTER — Other Ambulatory Visit: Payer: Self-pay | Admitting: Family Medicine

## 2020-07-15 DIAGNOSIS — R0789 Other chest pain: Secondary | ICD-10-CM

## 2020-07-25 MED FILL — METOPROLOL SUCCINATE ER 25: 25 | 90 days supply | Qty: 90 | Fill #1

## 2020-07-25 MED FILL — NIFEDIPINE ER OSMOTIC RELEA: 60 | 30 days supply | Qty: 30 | Fill #1

## 2020-07-25 MED FILL — DOXAZOSIN MESYLATE 1 MG TAB: 1 | 30 days supply | Qty: 30 | Fill #1

## 2020-07-25 MED FILL — HYDROCHLOROTHIAZIDE 25 MG T: 25 | 30 days supply | Qty: 30 | Fill #1

## 2020-08-01 ENCOUNTER — Ambulatory Visit (HOSPITAL_BASED_OUTPATIENT_CLINIC_OR_DEPARTMENT_OTHER)
Admission: RE | Admit: 2020-08-01 | Discharge: 2020-08-01 | Disposition: A | Discharge status: Home / Self Care | Payer: BC Managed Care – PPO | Source: Ambulatory Visit | Attending: Family Medicine | Admitting: Family Medicine

## 2020-08-01 ENCOUNTER — Other Ambulatory Visit: Payer: Self-pay

## 2020-08-01 DIAGNOSIS — R0789 Other chest pain: Secondary | ICD-10-CM | POA: Insufficient documentation

## 2020-08-02 LAB — ECHOCARDIOGRAM COMPLETE
Area-P 1/2: 2.76 cm2
S' Lateral: 2.63 cm

## 2020-08-05 ENCOUNTER — Other Ambulatory Visit: Payer: BC Managed Care – PPO | Admitting: *Deleted

## 2020-08-05 ENCOUNTER — Other Ambulatory Visit: Payer: Self-pay

## 2020-08-06 LAB — BASIC METABOLIC PANEL
BUN/Creatinine Ratio: 13 (ref 9–20)
BUN: 20 mg/dL (ref 6–24)
CO2: 27 mmol/L (ref 20–29)
Calcium: 9.4 mg/dL (ref 8.7–10.2)
Chloride: 104 mmol/L (ref 96–106)
Creatinine, Ser: 1.55 mg/dL — ABNORMAL HIGH (ref 0.76–1.27)
GFR calc Af Amer: 57 mL/min/{1.73_m2} — ABNORMAL LOW (ref >59)
GFR calc non Af Amer: 50 mL/min/{1.73_m2} — ABNORMAL LOW (ref >59)
Glucose: 87 mg/dL (ref 65–99)
Potassium: 3.7 mmol/L (ref 3.5–5.2)
Sodium: 144 mmol/L (ref 134–144)

## 2020-08-15 MED FILL — ATORVASTATIN 40 MG TABLET: 40 | 90 days supply | Qty: 90 | Fill #1

## 2020-08-29 MED FILL — SILDENAFIL CITRATE 100 MG T: 100 | 10 days supply | Qty: 10 | Fill #4

## 2020-08-29 MED FILL — HYDROCHLOROTHIAZIDE 25 MG T: 25 | 30 days supply | Qty: 30 | Fill #2

## 2020-08-29 MED FILL — DOXAZOSIN MESYLATE 1 MG TAB: 1 | 30 days supply | Qty: 30 | Fill #2

## 2020-08-29 MED FILL — NIFEDIPINE ER OSMOTIC RELEA: 60 | 30 days supply | Qty: 30 | Fill #2

## 2020-08-30 MED FILL — POTASSIUM CHLORIDE CRYS ER: 20 | 90 days supply | Qty: 180 | Fill #0

## 2020-09-27 ENCOUNTER — Encounter: Payer: Self-pay | Admitting: Cardiology

## 2020-09-27 ENCOUNTER — Ambulatory Visit (INDEPENDENT_AMBULATORY_CARE_PROVIDER_SITE_OTHER): Payer: BC Managed Care – PPO | Admitting: Cardiology

## 2020-09-27 ENCOUNTER — Other Ambulatory Visit: Payer: Self-pay

## 2020-09-27 VITALS — BP 134/72 | HR 58 | Ht 73.0 in | Wt 237.0 lb

## 2020-09-27 DIAGNOSIS — R079 Chest pain, unspecified: Secondary | ICD-10-CM | POA: Diagnosis not present

## 2020-09-27 DIAGNOSIS — I10 Essential (primary) hypertension: Secondary | ICD-10-CM | POA: Diagnosis not present

## 2020-09-27 LAB — BASIC METABOLIC PANEL
BUN/Creatinine Ratio: 12 (ref 9–20)
BUN: 17 mg/dL (ref 6–24)
CO2: 26 mmol/L (ref 20–29)
Calcium: 9.5 mg/dL (ref 8.7–10.2)
Chloride: 102 mmol/L (ref 96–106)
Creatinine, Ser: 1.42 mg/dL — ABNORMAL HIGH (ref 0.76–1.27)
GFR calc Af Amer: 64 mL/min/{1.73_m2} (ref >59)
GFR calc non Af Amer: 55 mL/min/{1.73_m2} — ABNORMAL LOW (ref >59)
Glucose: 94 mg/dL (ref 65–99)
Potassium: 3.8 mmol/L (ref 3.5–5.2)
Sodium: 142 mmol/L (ref 134–144)

## 2020-09-27 LAB — MAGNESIUM: Magnesium: 2 mg/dL (ref 1.6–2.3)

## 2020-09-27 NOTE — Progress Notes (Signed)
Electrophysiology Office Follow up Visit Note:    Date:  09/27/2020   ID:  Bobby PoePrince Travaglini Jr., DOB 07-17-1966, MRN [id number]  PCP:  Zola ButtonLowne Chase, Grayling CongressYvonne R, DO  [facility] Cardiologist:  No primary care provider on file.  [facility] Electrophysiologist:  Lanier PrudeAMERON T Dewey Viens, MD    Interval History:    Bobby Poerince Bonelli Jr. is a 54 y.o. male who presents for a follow up visit. They were last seen in clinic June 29, 2020 for chest pain.  After that appointment, an echo and nuclear stress test were ordered.  Stress test and echo were both normal.  He has not had any additional episodes of chest pain.  His blood pressure is now well controlled on Cardura, hydrochlorothiazide, metoprolol, nifedipine.    Past Medical History:  Diagnosis Date  . Allergy   . Bell's palsy    History  . Hyperlipidemia   . Hypertension   . Sleep apnea    uses CPAP nightly    Past Surgical History:  Procedure Laterality Date  . ADENOIDECTOMY  08/2011   [place]ore--- gso ent  . CLOSED REDUCTION HAND FRACTURE  12/2010   Right hand  . COLONOSCOPY    . KNEE SURGERY     bilat scops  . POLYPECTOMY      Current Medications: Current Meds  Medication Sig  . atorvastatin (LIPITOR) 40 MG tablet Take 1 tablet (40 mg total) by mouth at bedtime.  Marland Kitchen. doxazosin (CARDURA) 1 MG tablet Take 1 tablet (1 mg total) by mouth daily.  . hydrochlorothiazide (HYDRODIURIL) 25 MG tablet Take 1 tablet (25 mg total) by mouth daily.  . metoprolol succinate (TOPROL-XL) 25 MG 24 hr tablet TAKE 1 TABLET (25 MG TOTAL) BY MOUTH DAILY.  Marland Kitchen. NIFEdipine (PROCARDIA XL/NIFEDICAL XL) 60 MG 24 hr tablet Take 1 tablet (60 mg total) by mouth daily.  . potassium chloride SA (KLOR-CON) 20 MEQ tablet Take 2 tablets (40 mEq total) by mouth daily.  . sildenafil (VIAGRA) 100 MG tablet Take 0.5-1 tablets (50-100 mg total) by mouth daily as needed for erectile dysfunction.   Current Facility-Administered Medications for the 09/27/20 encounter (Office  Visit) with Lanier PrudeLambert, Sundeep Destin T, MD  Medication  . 0.9 %  sodium chloride infusion  . 0.9 %  sodium chloride infusion     Allergies:   Ace inhibitors   Social History   Socioeconomic History  . Marital status: Married    Spouse name: Not on file  . Number of children: Y  . Years of education: Not on file  . Highest education level: Not on file  Occupational History  . Occupation: combine Neurosurgeoninsurance    Employer: [employer]   . Occupation: Geologist, engineeringteacher assistant  Tobacco Use  . Smoking status: Never Smoker  . Smokeless tobacco: Never Used  Vaping Use  . Vaping Use: Never used  Substance and Sexual Activity  . Alcohol use: No    Alcohol/week: 0.0 standard drinks  . Drug use: No  . Sexual activity: Yes    Partners: Male  Other Topics Concern  . Not on file  Social History Narrative   Regular Exercise- qd    Social Determinants of Health   Financial Resource Strain: Not on file  Food Insecurity: Not on file  Transportation Needs: Not on file  Physical Activity: Not on file  Stress: Not on file  Social Connections: Not on file     Family History: The patient's family history includes Alzheimer's disease in his maternal grandmother;  Brain cancer in his sister; Breast cancer in his maternal aunt and mother; Cancer in his father; Cancer (age of onset: 68) in his maternal aunt; Cancer (age of onset: 50) in his mother; Colon cancer in his father; Heart disease in his maternal grandfather and maternal uncle; Hyperlipidemia in his father and maternal grandfather; Hypertension in his father, maternal grandfather, maternal grandmother, maternal uncle, maternal uncle, mother, sister, and another family member; Stroke in his father, maternal grandfather, and maternal uncle. There is no history of Colon polyps, Rectal cancer, Stomach cancer, or Esophageal cancer.  ROS:   Please see the history of present illness.    All other systems reviewed and are negative.  EKGs/Labs/Other  Studies Reviewed:    The following studies were reviewed today: Prior notes, stress test, echo  August 01, 2020 echo personally reviewed Left ventricular function normal, 60% Right ventricular function normal Mild left ventricular hypertrophy  July 12, 2020 nuclear stress  Nuclear stress EF: 57%.  Blood pressure demonstrated a normal response to exercise.  There was no ST segment deviation noted during stress.  The study is normal.  This is a low risk study.  The left ventricular ejection fraction is normal (55-65%).    Recent Labs: 05/05/2020: ALT 18; Hemoglobin 12.8; Platelets 229.0; TSH 1.78 09/27/2020: BUN 17; Creatinine, Ser 1.42; Magnesium 2.0; Potassium 3.8; Sodium 142  Recent Lipid Panel    Component Value Date/Time   CHOL 206 (H) 05/05/2020 1526   TRIG 154.0 (H) 05/05/2020 1526   TRIG 59 08/15/2006 1121   HDL 38.90 (L) 05/05/2020 1526   CHOLHDL 5 05/05/2020 1526   VLDL 30.8 05/05/2020 1526   LDLCALC 136 (H) 05/05/2020 1526   LDLCALC 82 01/09/2019 1509    Physical Exam:    VS:  BP 134/72   Pulse (!) 58   Ht 6\' 1"  (1.854 m)   Wt 237 lb (107.5 kg)   SpO2 98%   BMI 31.27 kg/m     Wt Readings from Last 3 Encounters:  09/27/20 237 lb (107.5 kg)  07/12/20 240 lb (108.9 kg)  07/07/20 236 lb 9.6 oz (107.3 kg)     GEN:  Well nourished, well developed in no acute distress HEENT: Normal NECK: No JVD; No carotid bruits LYMPHATICS: No lymphadenopathy CARDIAC: RRR, no murmurs, rubs, gallops RESPIRATORY:  Clear to auscultation without rales, wheezing or rhonchi  ABDOMEN: Soft, non-tender, non-distended MUSCULOSKELETAL:  No edema; No deformity  SKIN: Warm and dry NEUROLOGIC:  Alert and oriented x 3 PSYCHIATRIC:  Normal affect   ASSESSMENT:    1. Essential hypertension   2. Chest pain, unspecified type    PLAN:    In order of problems listed above:  1. Hypertension Well-controlled on Cardura, hydrochlorothiazide, metoprolol, nifedipine We will  recheck labs today to confirm stable electrolytes on his diuretic potassium supplement.  2.  Chest pain No further episodes.  Normal stress test.  Suspect it was a noncardiac cause.  Plan to follow-up in 6 months.  Medication Adjustments/Labs and Tests Ordered: Current medicines are reviewed at length with the patient today.  Concerns regarding medicines are outlined above.  Orders Placed This Encounter  Procedures  . Basic Metabolic Panel (BMET)  . Magnesium   No orders of the defined types were placed in this encounter.    Signed, Lars Mage, MD, [facility]  09/27/2020 8:13 PM    Electrophysiology [place]

## 2020-09-27 NOTE — Patient Instructions (Addendum)
Medication Instructions:  Your physician recommends that you continue on your current medications as directed. Please refer to the Current Medication list given to you today.  Labwork: You will get lab work today:  BMP and magnesium  Testing/Procedures: None ordered.  Follow-Up: Your physician wants you to follow-up in: 6 months with Dr. Quentin Ore.   You will receive a reminder letter in the mail two months in advance. If you don't receive a letter, please call our office to schedule the follow-up appointment.  Any Other Special Instructions Will Be Listed Below (If Applicable).  If you need a refill on your cardiac medications before your next appointment, please call your pharmacy.

## 2020-10-03 ENCOUNTER — Other Ambulatory Visit: Payer: Self-pay | Admitting: Family Medicine

## 2020-10-03 DIAGNOSIS — I10 Essential (primary) hypertension: Secondary | ICD-10-CM

## 2020-10-03 MED FILL — NIFEDIPINE ER OSMOTIC RELEA: 60 | 30 days supply | Qty: 30 | Fill #3

## 2020-10-03 MED FILL — HYDROCHLOROTHIAZIDE 25 MG T: 25 | 30 days supply | Qty: 30 | Fill #3

## 2020-10-03 MED FILL — DOXAZOSIN MESYLATE 1 MG TAB: 1 | 90 days supply | Qty: 90 | Fill #0

## 2020-11-02 ENCOUNTER — Other Ambulatory Visit: Payer: Self-pay | Admitting: Cardiology

## 2020-11-02 ENCOUNTER — Other Ambulatory Visit: Payer: Self-pay | Admitting: Family Medicine

## 2020-11-02 MED FILL — HYDROCHLOROTHIAZIDE 25 MG T: 25 | 30 days supply | Qty: 30 | Fill #0

## 2020-11-02 MED FILL — ATORVASTATIN 40 MG TABLET: 40 | 90 days supply | Qty: 90 | Fill #0

## 2020-11-02 MED FILL — NIFEDIPINE ER OSMOTIC RELEA: 60 | 30 days supply | Qty: 30 | Fill #0

## 2020-11-07 ENCOUNTER — Encounter: Payer: Self-pay | Admitting: Family Medicine

## 2020-11-07 ENCOUNTER — Other Ambulatory Visit: Payer: Self-pay | Admitting: Family Medicine

## 2020-11-07 ENCOUNTER — Ambulatory Visit (INDEPENDENT_AMBULATORY_CARE_PROVIDER_SITE_OTHER): Payer: BLUE CROSS/BLUE SHIELD | Admitting: Family Medicine

## 2020-11-07 ENCOUNTER — Ambulatory Visit (HOSPITAL_BASED_OUTPATIENT_CLINIC_OR_DEPARTMENT_OTHER)
Admission: RE | Admit: 2020-11-07 | Discharge: 2020-11-07 | Disposition: A | Discharge status: Home / Self Care | Payer: BLUE CROSS/BLUE SHIELD | Source: Ambulatory Visit | Attending: Family Medicine | Admitting: Family Medicine

## 2020-11-07 ENCOUNTER — Other Ambulatory Visit: Payer: Self-pay

## 2020-11-07 VITALS — BP 114/60 | HR 84 | Temp 98.5°F | Resp 18 | Ht 73.0 in | Wt 231.0 lb

## 2020-11-07 DIAGNOSIS — M25561 Pain in right knee: Secondary | ICD-10-CM

## 2020-11-07 DIAGNOSIS — E785 Hyperlipidemia, unspecified: Secondary | ICD-10-CM

## 2020-11-07 DIAGNOSIS — I10 Essential (primary) hypertension: Secondary | ICD-10-CM | POA: Diagnosis not present

## 2020-11-07 MED ORDER — MELOXICAM 15 MG PO TABS: ORAL_TABLET | ORAL | 0 refills | Status: DC

## 2020-11-07 MED FILL — MELOXICAM 15 MG TABLET: 15 | 30 days supply | Qty: 30 | Fill #0

## 2020-11-07 NOTE — Progress Notes (Signed)
Patient ID: Bobby Castaneda., male    DOB: Jun 02, 1966  Age: 55 y.o. MRN: [id number]    Subjective:  Subjective  HPI Bobby Castaneda. presents for f/u bp and cholesterol but also c/o R knee pain x 1 week after playing basketball.  He was fine right after but woke up the next day and his knee locked up---it was swollen -- the swelling has gone down   Pain lat to knee cap.  No other complaint s  Review of Systems  Constitutional: Negative for appetite change, diaphoresis, fatigue and unexpected weight change.  Eyes: Negative for pain, redness and visual disturbance.  Respiratory: Negative for cough, chest tightness, shortness of breath and wheezing.   Cardiovascular: Negative for chest pain, palpitations and leg swelling.  Endocrine: Negative for cold intolerance, heat intolerance, polydipsia, polyphagia and polyuria.  Genitourinary: Negative for difficulty urinating, dysuria and frequency.  Musculoskeletal: Positive for arthralgias and joint swelling.  Neurological: Negative for dizziness, light-headedness, numbness and headaches.    History Past Medical History:  Diagnosis Date  . Allergy   . Bell's palsy    History  . Hyperlipidemia   . Hypertension   . Sleep apnea    uses CPAP nightly    He has a past surgical history that includes Knee surgery; Adenoidectomy (08/2011); Closed reduction hand fracture (12/2010); Colonoscopy; and Polypectomy.   His family history includes Alzheimer's disease in his maternal grandmother; Brain cancer in his sister; Breast cancer in his maternal aunt and mother; Cancer in his father; Cancer (age of onset: 55) in his maternal aunt; Cancer (age of onset: 10) in his mother; Colon cancer in his father; Heart disease in his maternal grandfather and maternal uncle; Hyperlipidemia in his father and maternal grandfather; Hypertension in his father, maternal grandfather, maternal grandmother, maternal uncle, maternal uncle, mother, sister, and another family  member; Stroke in his father, maternal grandfather, and maternal uncle.He reports that he has never smoked. He has never used smokeless tobacco. He reports that he does not drink alcohol and does not use drugs.  Current Outpatient Medications on File Prior to Visit  Medication Sig Dispense Refill  . atorvastatin (LIPITOR) 40 MG tablet Take 1 tablet (40 mg total) by mouth at bedtime. 90 tablet 0  . doxazosin (CARDURA) 1 MG tablet Take 1 tablet (1 mg total) by mouth daily. 90 tablet 0  . hydrochlorothiazide (HYDRODIURIL) 25 MG tablet TAKE 1 TABLET (25 MG TOTAL) BY MOUTH DAILY. 30 tablet 10  . metoprolol succinate (TOPROL-XL) 25 MG 24 hr tablet TAKE 1 TABLET (25 MG TOTAL) BY MOUTH DAILY. 90 tablet 1  . NIFEdipine (PROCARDIA XL/NIFEDICAL XL) 60 MG 24 hr tablet TAKE 1 TABLET (60 MG TOTAL) BY MOUTH DAILY. 30 tablet 10  . potassium chloride SA (KLOR-CON) 20 MEQ tablet Take 2 tablets (40 mEq total) by mouth daily. 180 tablet 3  . sildenafil (VIAGRA) 100 MG tablet Take 0.5-1 tablets (50-100 mg total) by mouth daily as needed for erectile dysfunction. 10 tablet 11   Current Facility-Administered Medications on File Prior to Visit  Medication Dose Route Frequency Provider Last Rate Last Admin  . 0.9 %  sodium chloride infusion  500 mL Intravenous Continuous Irene Shipper, MD      . 0.9 %  sodium chloride infusion  500 mL Intravenous Once Irene Shipper, MD         Objective:  Objective  Physical Exam Vitals and nursing note reviewed.  Constitutional:      General: He  is sleeping. Vital signs are normal.     Appearance: He is well-developed and well-nourished.  HENT:     Head: Normocephalic and atraumatic.     Mouth/Throat:     Mouth: Oropharynx is clear and moist.  Eyes:     Extraocular Movements: EOM normal.     Pupils: Pupils are equal, round, and reactive to light.  Neck:     Thyroid: No thyromegaly.  Cardiovascular:     Rate and Rhythm: Normal rate and regular rhythm.     Heart sounds: No  murmur heard.   Pulmonary:     Effort: Pulmonary effort is normal. No respiratory distress.     Breath sounds: Normal breath sounds. No wheezing or rales.  Chest:     Chest wall: No tenderness.  Musculoskeletal:        General: Swelling and tenderness present. No edema.     Cervical back: Normal range of motion and neck supple.     Right knee: Swelling and effusion present. Decreased range of motion. Tenderness present over the lateral joint line.  Skin:    General: Skin is warm and dry.  Neurological:     Mental Status: He is oriented to person, place, and time.  Psychiatric:        Mood and Affect: Mood and affect normal.        Behavior: Behavior normal.        Thought Content: Thought content normal.        Judgment: Judgment normal.    BP 114/60 (BP Location: Right Arm, Patient Position: Sitting, Cuff Size: Large)   Pulse 84   Temp 98.5 F (36.9 C) (Oral)   Resp 18   Ht 6\' 1"  (1.854 m)   Wt 231 lb (104.8 kg)   SpO2 97%   BMI 30.48 kg/m  Wt Readings from Last 3 Encounters:  11/07/20 231 lb (104.8 kg)  09/27/20 237 lb (107.5 kg)  07/12/20 240 lb (108.9 kg)     Lab Results  Component Value Date   WBC 4.1 05/05/2020   HGB 12.8 (L) 05/05/2020   HCT 38.0 (L) 05/05/2020   PLT 229.0 05/05/2020   GLUCOSE 94 09/27/2020   CHOL 206 (H) 05/05/2020   TRIG 154.0 (H) 05/05/2020   HDL 38.90 (L) 05/05/2020   LDLCALC 136 (H) 05/05/2020   ALT 18 05/05/2020   AST 21 05/05/2020   NA 142 09/27/2020   K 3.8 09/27/2020   CL 102 09/27/2020   CREATININE 1.42 (H) 09/27/2020   BUN 17 09/27/2020   CO2 26 09/27/2020   TSH 1.78 05/05/2020   PSA 2.35 05/05/2020   MICROALBUR 2.2 (H) 02/17/2013    ECHOCARDIOGRAM COMPLETE  Result Date: 08/02/2020    ECHOCARDIOGRAM REPORT   Patient Name:   Bobby Castaneda. Date of Exam: 08/01/2020 Medical Rec #:  [id number]          Height:       73.0 in Accession #:    [id number]         Weight:       240.0 lb Date of Birth:  July 25, 1966           BSA:          2.325 m Patient Age:    14 years           BP:           118/68 mmHg Patient Gender: M  HR:           51 bpm. Exam Location:  [facility] Procedure: 2D Echo, 3D Echo, Cardiac Doppler and Color Doppler Indications:    R07.89 Other chest pain  History:        Patient has no prior history of Echocardiogram examinations.                 Signs/Symptoms:Chest Pain; Risk Factors:Hypertension,                 Dyslipidemia and Sleep Apnea.  Sonographer:    Geradine Girt Referring Phys: [place]  1. Left ventricular ejection fraction, by estimation, is 60 to 65%. The left ventricle has normal function. The left ventricle has no regional wall motion abnormalities. There is mild left ventricular hypertrophy. Left ventricular diastolic parameters are consistent with Grade II diastolic dysfunction (pseudonormalization).  2. Right ventricular systolic function is normal. The right ventricular size is normal.  3. The mitral valve is normal in structure. No evidence of mitral valve regurgitation. No evidence of mitral stenosis.  4. The aortic valve is normal in structure. Aortic valve regurgitation is not visualized. No aortic stenosis is present.  5. The inferior vena cava is normal in size with greater than 50% respiratory variability, suggesting right atrial pressure of 3 mmHg. FINDINGS  Left Ventricle: Left ventricular ejection fraction, by estimation, is 60 to 65%. The left ventricle has normal function. The left ventricle has no regional wall motion abnormalities. The left ventricular internal cavity size was normal in size. There is  mild left ventricular hypertrophy. Left ventricular diastolic parameters are consistent with Grade II diastolic dysfunction (pseudonormalization). Right Ventricle: The right ventricular size is normal. No increase in right ventricular wall thickness. Right ventricular systolic function is normal. Left Atrium: Left atrial size was normal in  size. Right Atrium: Right atrial size was normal in size. Pericardium: There is no evidence of pericardial effusion. Mitral Valve: The mitral valve is normal in structure. No evidence of mitral valve regurgitation. No evidence of mitral valve stenosis. Tricuspid Valve: The tricuspid valve is normal in structure. Tricuspid valve regurgitation is not demonstrated. No evidence of tricuspid stenosis. Aortic Valve: The aortic valve is normal in structure. Aortic valve regurgitation is not visualized. No aortic stenosis is present. Pulmonic Valve: The pulmonic valve was normal in structure. Pulmonic valve regurgitation is not visualized. No evidence of pulmonic stenosis. Aorta: The aortic root is normal in size and structure. Venous: The inferior vena cava is normal in size with greater than 50% respiratory variability, suggesting right atrial pressure of 3 mmHg. IAS/Shunts: No atrial level shunt detected by color flow Doppler.  LEFT VENTRICLE PLAX 2D LVIDd:         4.69 cm  Diastology LVIDs:         2.63 cm  LV e' medial:    5.66 cm/s LV PW:         1.10 cm  LV E/e' medial:  17.2 LV IVS:        1.17 cm  LV e' lateral:   6.96 cm/s LVOT diam:     2.30 cm  LV E/e' lateral: 14.0 LV SV:         105 LV SV Index:   45 LVOT Area:     4.15 cm                          3D Volume EF:  3D EF:        71 %                         LV EDV:       364 ml                         LV ESV:       105 ml                         LV SV:        259 ml RIGHT VENTRICLE RV S prime:     15.40 cm/s TAPSE (M-mode): 3.4 cm LEFT ATRIUM             Index       RIGHT ATRIUM           Index LA diam:        3.50 cm 1.51 cm/m  RA Area:     15.60 cm LA Vol (A2C):   57.2 ml 24.60 ml/m RA Volume:   44.20 ml  19.01 ml/m LA Vol (A4C):   46.4 ml 19.96 ml/m LA Biplane Vol: 51.1 ml 21.98 ml/m  AORTIC VALVE LVOT Vmax:   115.00 cm/s LVOT Vmean:  74.300 cm/s LVOT VTI:    0.253 m  AORTA Ao Root diam: 3.30 cm Ao Asc diam:  3.40 cm MITRAL VALVE  MV Area (PHT): 2.76 cm    SHUNTS MV Decel Time: 275 msec    Systemic VTI:  0.25 m MV E velocity: 97.30 cm/s  Systemic Diam: 2.30 cm MV A velocity: 98.50 cm/s MV E/A ratio:  0.99 Jenne Campus MD Electronically signed by Jenne Campus MD Signature Date/Time: 08/02/2020/12:15:19 PM    Final      Assessment & Plan:  Plan  I am having Bobby Castaneda. start on meloxicam. I am also having him maintain his sildenafil, metoprolol succinate, potassium chloride SA, doxazosin, hydrochlorothiazide, atorvastatin, and NIFEdipine. We will continue to administer sodium chloride and sodium chloride.  Meds ordered this encounter  Medications  . meloxicam (MOBIC) 15 MG tablet    Sig: 1/2-1 po qd prn    Dispense:  30 tablet    Refill:  0    Problem List Items Addressed This Visit      Unprioritized   Acute pain of right knee - Primary    Check xray mobic prn  Ice , rest , elevation  Ortho / sport med      Relevant Medications   meloxicam (MOBIC) 15 MG tablet   Other Relevant Orders   DG Knee Complete 4 Views Right   Essential hypertension    Well controlled, no changes to meds. Encouraged heart healthy diet such as the DASH diet and exercise as tolerated.       Hyperlipidemia    Tolerating statin, encouraged heart healthy diet, avoid trans fats, minimize simple carbs and saturated fats. Increase exercise as tolerated       Other Visit Diagnoses    Primary hypertension       Relevant Orders   Comprehensive metabolic panel   Lipid panel      Follow-up: Return in about 6 months (around 05/07/2021), or if symptoms worsen or fail to improve, for annual exam, fasting.  Ann Held, DO

## 2020-11-07 NOTE — Patient Instructions (Signed)
Acute Knee Pain, Adult Acute knee pain is sudden and may be caused by damage, swelling, or irritation of the muscles and tissues that support the knee. Pain may result from:  A fall.  An injury to the knee from twisting motions.  A hit to the knee.  Infection. Acute knee pain may go away on its own with time and rest. If it does not, your health care provider may order tests to find the cause of the pain. These may include:  Imaging tests, such as an X-ray, MRI, CT scan, or ultrasound.  Joint aspiration. In this test, fluid is removed from the knee and evaluated.  Arthroscopy. In this test, a lighted tube is inserted into the knee and an image is projected onto a TV screen.  Biopsy. In this test, a sample of tissue is removed from the body and studied under a microscope. Follow these instructions at home: If you have a knee sleeve or brace:  Wear the knee sleeve or brace as told by your health care provider. Remove it only as told by your health care provider.  Loosen it if your toes tingle, become numb, or turn cold and blue.  Keep it clean.  If the knee sleeve or brace is not waterproof: ? Do not let it get wet. ? Cover it with a watertight covering when you take a bath or shower.   Activity  Rest your knee.  Do not do things that cause pain or make pain worse.  Avoid high-impact activities or exercises, such as running, jumping rope, or doing jumping jacks.  Work with a physical therapist to make a safe exercise program, as recommended by your health care provider. Do exercises as told by your physical therapist. Managing pain, stiffness, and swelling  If directed, put ice on the affected knee. To do this: ? If you have a removable knee sleeve or brace, remove it as told by your health care provider. ? Put ice in a plastic bag. ? Place a towel between your skin and the bag. ? Leave the ice on for 20 minutes, 2-3 times a day. ? Remove the ice if your skin turns bright  red. This is very important. If you cannot feel pain, heat, or cold, you have a greater risk of damage to the area.  If directed, use an elastic bandage to put pressure (compression) on your injured knee. This may control swelling, give support, and help with discomfort.  Raise (elevate) your knee above the level of your heart while you are sitting or lying down.  Sleep with a pillow under your knee.   General instructions  Take over-the-counter and prescription medicines only as told by your health care provider.  Do not use any products that contain nicotine or tobacco, such as cigarettes, e-cigarettes, and chewing tobacco. If you need help quitting, ask your health care provider.  If you are overweight, work with your health care provider and a dietitian to set a weight-loss goal that is healthy and reasonable for you. Extra weight can put pressure on your knee.  Pay attention to any changes in your symptoms.  Keep all follow-up visits. This is important. Contact a health care provider if:  Your knee pain continues, changes, or gets worse.  You have a fever along with knee pain.  Your knee feels warm to the touch or is red.  Your knee buckles or locks up. Get help right away if:  Your knee swells, and the swelling becomes   worse.  You cannot move your knee.  You have severe pain in your knee that cannot be managed with pain medicine. Summary  Acute knee pain can be caused by a fall, an injury, an infection, or damage, swelling, or irritation of the tissues that support your knee.  Your health care provider may perform tests to find out the cause of the pain.  Pay attention to any changes in your symptoms. Relieve your pain with rest, medicines, light activity, and the use of ice.  Get help right away if your knee swells, you cannot move your knee, or you have severe pain that cannot be managed with medicine. This information is not intended to replace advice given to you  by your health care provider. Make sure you discuss any questions you have with your health care provider. Document Revised: 03/09/2020 Document Reviewed: 03/09/2020 Elsevier Patient Education  2021 Elsevier Inc.  

## 2020-11-07 NOTE — Assessment & Plan Note (Signed)
Well controlled, no changes to meds. Encouraged heart healthy diet such as the DASH diet and exercise as tolerated.  °

## 2020-11-07 NOTE — Assessment & Plan Note (Signed)
Check xray mobic prn  Ice , rest , elevation  Ortho / sport med

## 2020-11-07 NOTE — Assessment & Plan Note (Signed)
Tolerating statin, encouraged heart healthy diet, avoid trans fats, minimize simple carbs and saturated fats. Increase exercise as tolerated 

## 2020-11-08 ENCOUNTER — Telehealth: Payer: Self-pay | Admitting: Family Medicine

## 2020-11-08 NOTE — Telephone Encounter (Signed)
Patient is requesting a call back to go over  Imaging results

## 2020-11-09 ENCOUNTER — Other Ambulatory Visit: Payer: Self-pay

## 2020-11-09 DIAGNOSIS — M25561 Pain in right knee: Secondary | ICD-10-CM

## 2020-11-09 NOTE — Telephone Encounter (Signed)
Spoke with patient. Pt would referral placed. Referral placed

## 2020-11-14 ENCOUNTER — Ambulatory Visit (INDEPENDENT_AMBULATORY_CARE_PROVIDER_SITE_OTHER): Payer: BC Managed Care – PPO | Admitting: Sports Medicine

## 2020-11-14 ENCOUNTER — Other Ambulatory Visit: Payer: Self-pay

## 2020-11-14 VITALS — BP 146/90 | Ht 73.0 in | Wt 228.0 lb

## 2020-11-14 DIAGNOSIS — M1711 Unilateral primary osteoarthritis, right knee: Secondary | ICD-10-CM

## 2020-11-14 NOTE — Progress Notes (Signed)
   Subjective:    Patient ID: Bobby Castaneda., male    DOB: [birth year], 55 y.o.   MRN: [id number]  HPI chief complaint: Right knee pain and swelling  Very pleasant 55 year old male comes in today complaining of 2 weeks of right knee pain and swelling.  He denies any known trauma to the knee but did notice pain and swelling one day after playing basketball.  He believes he may have overdone it.  He played basketball 3 times the previous week which is a little more than he usually does.  A nonstanding x-ray of his knee was ordered and he saw his PCP.  He was prescribed meloxicam but only took a couple of doses.  His pain and swelling have improved dramatically over the past 2 weeks but his pain has not resolved.  He endorses intermittent pain along the lateral knee with twisting or turning.  No locking or catching.  He denies painful popping.  He does have a history of a right knee arthroscopy 10 to 15 years ago.  Past medical history reviewed Medications reviewed Allergies reviewed    Review of Systems As above    Objective:   Physical Exam  Developed, well nourished.  No acute distress.  Right knee: Range of motion is 0 to 125 degrees.  No obvious effusion.  Trace patellofemoral crepitus.  Knee is stable to valgus and varus stressing.  Negative anterior drawer, negative posterior drawer.  No tenderness to palpation along medial or lateral joint lines.  Negative McMurray's.  Negative Thessaly's.  Neurovascularly intact distally.  Nonstanding right knee films from January 30 show mild to moderate medial compartmental narrowing and osteophyte formation      Assessment & Plan:   Right knee pain and swelling likely secondary to DJD  Patient's symptoms have improved dramatically over the past 2 weeks.  In addition to basketball, he enjoys biking and lifting weights.  I recommended that he start with biking since it is a nonimpact exercise.  He may also continue weight lifting but I  recommended against closed chain exercises when exercising his legs.  He will start with isometric quad exercises and build up from there.  I also think he should continue with compression when active.  He may decide to go with a body helix compression sleeve.  The last activity he will want to resume will be basketball and I recommended he wait about 3 weeks before returning to the court.  I explained that I can get more aggressive with work-up and treatment if his symptoms once again worsen.  I would likely start with getting standing x-rays of his knee and considering a cortisone injection.  He will follow-up for ongoing or recalcitrant issues.

## 2020-11-16 ENCOUNTER — Other Ambulatory Visit: Payer: Self-pay

## 2020-11-16 ENCOUNTER — Other Ambulatory Visit (INDEPENDENT_AMBULATORY_CARE_PROVIDER_SITE_OTHER): Payer: BC Managed Care – PPO

## 2020-11-16 DIAGNOSIS — I10 Essential (primary) hypertension: Secondary | ICD-10-CM | POA: Diagnosis not present

## 2020-11-17 ENCOUNTER — Telehealth: Payer: Self-pay | Admitting: *Deleted

## 2020-11-17 ENCOUNTER — Other Ambulatory Visit: Payer: Self-pay | Admitting: Family Medicine

## 2020-11-17 DIAGNOSIS — E876 Hypokalemia: Secondary | ICD-10-CM

## 2020-11-17 DIAGNOSIS — I10 Essential (primary) hypertension: Secondary | ICD-10-CM

## 2020-11-17 LAB — LIPID PANEL
Cholesterol: 117 mg/dL (ref 0–200)
HDL: 36.1 mg/dL — ABNORMAL LOW (ref >39.00)
LDL Cholesterol: 66 mg/dL (ref 0–99)
NonHDL: 80.75
Total CHOL/HDL Ratio: 3
Triglycerides: 76 mg/dL (ref 0.0–149.0)
VLDL: 15.2 mg/dL (ref 0.0–40.0)

## 2020-11-17 LAB — COMPREHENSIVE METABOLIC PANEL
ALT: 14 U/L (ref 0–53)
AST: 19 U/L (ref 0–37)
Albumin: 4.1 g/dL (ref 3.5–5.2)
Alkaline Phosphatase: 74 U/L (ref 39–117)
BUN: 20 mg/dL (ref 6–23)
CO2: 34 mEq/L — ABNORMAL HIGH (ref 19–32)
Calcium: 9.3 mg/dL (ref 8.4–10.5)
Chloride: 101 mEq/L (ref 96–112)
Creatinine, Ser: 1.55 mg/dL — ABNORMAL HIGH (ref 0.40–1.50)
GFR: 49.92 mL/min — ABNORMAL LOW (ref >60.00)
Glucose, Bld: 88 mg/dL (ref 70–99)
Potassium: 3.2 mEq/L — ABNORMAL LOW (ref 3.5–5.1)
Sodium: 140 mEq/L (ref 135–145)
Total Bilirubin: 0.5 mg/dL (ref 0.2–1.2)
Total Protein: 7.4 g/dL (ref 6.0–8.3)

## 2020-11-17 NOTE — Telephone Encounter (Signed)
Patient notified and lab appointment made for recheck.

## 2020-11-17 NOTE — Telephone Encounter (Signed)
Let increase to 3 a day for a week and recheck bmp 2 weeks  If he starts having leg cramps call office

## 2020-11-17 NOTE — Telephone Encounter (Signed)
Spoke with patient and he stated that he is taking the K supplement as directed 2 tabs a day.

## 2020-11-17 NOTE — Telephone Encounter (Signed)
-----   Message from Ann Held, [place] sent at 11/17/2020 12:39 PM EST ----- Potassium is low --- is he taking the K supplement ? If yes we may need to adjust med

## 2020-11-18 NOTE — Telephone Encounter (Signed)
Patient states he had leg cramps last night. Patient would like to know if increasing the  medication is going to prevent leg cramps?

## 2020-11-18 NOTE — Telephone Encounter (Signed)
Patient just started the 3 tabs daily last night.

## 2020-11-18 NOTE — Telephone Encounter (Signed)
Spoke with patient and he will continue new dose of potassium.  He stated that it only happens when he is in the bed.  Advised that new dose should help and if after taking the new dose for a week then he needs to call us back otherwise the new dose should help.

## 2020-11-18 NOTE — Telephone Encounter (Signed)
Yes -- the low potassium could cause the leg cramps

## 2020-11-21 ENCOUNTER — Other Ambulatory Visit: Payer: Self-pay | Admitting: Family Medicine

## 2020-11-21 DIAGNOSIS — I10 Essential (primary) hypertension: Secondary | ICD-10-CM

## 2020-11-21 MED FILL — METOPROLOL SUCCINATE ER 25: 25 | 90 days supply | Qty: 90 | Fill #0

## 2020-11-28 MED FILL — NIFEDIPINE ER OSMOTIC RELEA: 60 | 30 days supply | Qty: 30 | Fill #1

## 2020-11-28 MED FILL — HYDROCHLOROTHIAZIDE 25 MG T: 25 | 30 days supply | Qty: 30 | Fill #1

## 2020-12-01 ENCOUNTER — Other Ambulatory Visit: Payer: Self-pay

## 2020-12-01 ENCOUNTER — Other Ambulatory Visit (INDEPENDENT_AMBULATORY_CARE_PROVIDER_SITE_OTHER): Payer: BC Managed Care – PPO

## 2020-12-01 DIAGNOSIS — I10 Essential (primary) hypertension: Secondary | ICD-10-CM

## 2020-12-02 LAB — BASIC METABOLIC PANEL
BUN: 18 mg/dL (ref 6–23)
CO2: 33 mEq/L — ABNORMAL HIGH (ref 19–32)
Calcium: 9.5 mg/dL (ref 8.4–10.5)
Chloride: 101 mEq/L (ref 96–112)
Creatinine, Ser: 1.48 mg/dL (ref 0.40–1.50)
GFR: 52.75 mL/min — ABNORMAL LOW (ref >60.00)
Glucose, Bld: 87 mg/dL (ref 70–99)
Potassium: 3.5 mEq/L (ref 3.5–5.1)
Sodium: 140 mEq/L (ref 135–145)

## 2020-12-08 ENCOUNTER — Other Ambulatory Visit: Payer: Self-pay | Admitting: Family Medicine

## 2020-12-08 DIAGNOSIS — N529 Male erectile dysfunction, unspecified: Secondary | ICD-10-CM

## 2020-12-08 MED FILL — POTASSIUM CHLORIDE CRYS ER: 20 | 90 days supply | Qty: 180 | Fill #1

## 2020-12-08 MED FILL — SILDENAFIL CITRATE 100 MG T: 100 | 10 days supply | Qty: 10 | Fill #0

## 2020-12-14 ENCOUNTER — Telehealth: Payer: Self-pay

## 2020-12-14 NOTE — Telephone Encounter (Signed)
PA started for Sildenafil Citrate 100 MG  Key: [facility]

## 2020-12-14 NOTE — Telephone Encounter (Signed)
PA returned: Denied. Rx not covered over under patient's pharmacy benefit plan

## 2020-12-14 NOTE — Telephone Encounter (Signed)
Note covered that I know of---- usually out of pocket--- some pharmacies have cheaper generic like [facility] in [place]

## 2021-01-03 ENCOUNTER — Other Ambulatory Visit: Payer: Self-pay | Admitting: Family Medicine

## 2021-01-03 DIAGNOSIS — I10 Essential (primary) hypertension: Secondary | ICD-10-CM

## 2021-01-07 ENCOUNTER — Other Ambulatory Visit (HOSPITAL_COMMUNITY): Payer: Self-pay

## 2021-01-07 MED FILL — Nifedipine Tab ER 24HR Osmotic Release 60 MG: ORAL | 30 days supply | Qty: 30 | Fill #0 | Status: AC

## 2021-01-07 MED FILL — Hydrochlorothiazide Tab 25 MG: ORAL | 30 days supply | Qty: 30 | Fill #0 | Status: AC

## 2021-01-10 ENCOUNTER — Other Ambulatory Visit (HOSPITAL_COMMUNITY): Payer: Self-pay

## 2021-01-11 ENCOUNTER — Other Ambulatory Visit (HOSPITAL_COMMUNITY): Payer: Self-pay

## 2021-01-11 MED FILL — Doxazosin Mesylate Tab 1 MG: ORAL | 90 days supply | Qty: 90 | Fill #0 | Status: AC

## 2021-01-12 ENCOUNTER — Other Ambulatory Visit (HOSPITAL_COMMUNITY): Payer: Self-pay

## 2021-02-06 MED FILL — Nifedipine Tab ER 24HR Osmotic Release 60 MG: ORAL | 30 days supply | Qty: 30 | Fill #1 | Status: AC

## 2021-02-07 ENCOUNTER — Other Ambulatory Visit (HOSPITAL_COMMUNITY): Payer: Self-pay

## 2021-02-08 ENCOUNTER — Other Ambulatory Visit (HOSPITAL_COMMUNITY): Payer: Self-pay

## 2021-02-08 MED FILL — Hydrochlorothiazide Tab 25 MG: ORAL | 30 days supply | Qty: 30 | Fill #1 | Status: AC

## 2021-02-15 ENCOUNTER — Other Ambulatory Visit: Payer: Self-pay | Admitting: Family Medicine

## 2021-02-15 MED FILL — Potassium Chloride Microencapsulated Crys ER Tab 20 mEq: ORAL | 90 days supply | Qty: 180 | Fill #0 | Status: AC

## 2021-02-16 ENCOUNTER — Other Ambulatory Visit (HOSPITAL_COMMUNITY): Payer: Self-pay

## 2021-02-16 MED ORDER — ATORVASTATIN CALCIUM 40 MG PO TABS
40.0000 mg | ORAL_TABLET | Freq: Every day | ORAL | 0 refills | Status: DC
Start: 2021-02-16 — End: 2021-05-18
  Filled 2021-02-16: qty 90, 90d supply, fill #0

## 2021-02-24 ENCOUNTER — Other Ambulatory Visit (HOSPITAL_COMMUNITY): Payer: Self-pay

## 2021-02-24 MED FILL — Sildenafil Citrate Tab 100 MG: ORAL | 10 days supply | Qty: 10 | Fill #0 | Status: AC

## 2021-02-24 MED FILL — Metoprolol Succinate Tab ER 24HR 25 MG (Tartrate Equiv): ORAL | 90 days supply | Qty: 90 | Fill #0 | Status: AC

## 2021-03-13 ENCOUNTER — Other Ambulatory Visit (HOSPITAL_COMMUNITY): Payer: Self-pay

## 2021-03-13 MED FILL — Nifedipine Tab ER 24HR Osmotic Release 60 MG: ORAL | 30 days supply | Qty: 30 | Fill #2 | Status: AC

## 2021-03-13 MED FILL — Hydrochlorothiazide Tab 25 MG: ORAL | 30 days supply | Qty: 30 | Fill #2 | Status: AC

## 2021-03-30 ENCOUNTER — Encounter: Payer: Self-pay | Admitting: Family Medicine

## 2021-03-30 ENCOUNTER — Other Ambulatory Visit: Payer: Self-pay

## 2021-03-30 ENCOUNTER — Ambulatory Visit (HOSPITAL_BASED_OUTPATIENT_CLINIC_OR_DEPARTMENT_OTHER)
Admission: RE | Admit: 2021-03-30 | Discharge: 2021-03-30 | Disposition: A | Discharge status: Home / Self Care | Payer: BC Managed Care – PPO | Source: Ambulatory Visit | Attending: Family Medicine | Admitting: Family Medicine

## 2021-03-30 ENCOUNTER — Ambulatory Visit (INDEPENDENT_AMBULATORY_CARE_PROVIDER_SITE_OTHER): Payer: BC Managed Care – PPO | Admitting: Family Medicine

## 2021-03-30 VITALS — BP 120/70 | HR 62 | Temp 98.8°F | Resp 18 | Ht 73.0 in | Wt 225.4 lb

## 2021-03-30 DIAGNOSIS — M25561 Pain in right knee: Secondary | ICD-10-CM | POA: Insufficient documentation

## 2021-03-30 DIAGNOSIS — G8929 Other chronic pain: Secondary | ICD-10-CM | POA: Insufficient documentation

## 2021-03-30 NOTE — Progress Notes (Addendum)
Subjective:   By signing my name below, I, [name], attest that this documentation has been prepared under the direction and in the presence of Dr. Roma Schanz, DO. 03/30/2021      Patient ID: Bobby Neer., male    DOB: August 08, 1966, 55 y.o.   MRN: [id number]  Chief Complaint  Patient presents with   Knee Pain    Right knee- has been seen before for the knee. Pain is becoming constant. Has a wrap on today. Not taking any OTC tx.    HPI Patient is in today for a office visit. He complains of right knee pain. He went to a sport medicine physician who diagnosed him with arthritis and told him to rest. He rested since and recently tried to run and found his right knee was still in pain. He also developed right knee pain while walking. He applies ice and rest to manage his symptoms. He denies any knee swelling.   Past Medical History:  Diagnosis Date   Allergy    Bell's palsy    History   Hyperlipidemia    Hypertension    Sleep apnea    uses CPAP nightly    Past Surgical History:  Procedure Laterality Date   ADENOIDECTOMY  08/2011   Simeon Craft--- gso ent   CLOSED REDUCTION HAND FRACTURE  12/2010   Right hand   COLONOSCOPY     KNEE SURGERY     bilat scops   POLYPECTOMY      Family History  Problem Relation Age of Onset   Hypertension Mother    Cancer Mother 69       breast   Breast cancer Mother    Hypertension Father    Hyperlipidemia Father    Stroke Father    Cancer Father        prostate   Colon cancer Father        dx'd in his 28's   Hypertension Sister    Brain cancer Sister    Cancer Maternal Aunt 37       breast   Breast cancer Maternal Aunt    Heart disease Maternal Uncle        mi   Hypertension Maternal Uncle    Hypertension Maternal Grandmother    Alzheimer's disease Maternal Grandmother    Heart disease Maternal Grandfather        mi   Hypertension Maternal Grandfather    Hyperlipidemia Maternal Grandfather    Stroke Maternal  Grandfather    Stroke Maternal Uncle    Hypertension Maternal Uncle    Hypertension Other    Colon polyps Neg Hx    Rectal cancer Neg Hx    Stomach cancer Neg Hx    Esophageal cancer Neg Hx     Social History   Socioeconomic History   Marital status: Married    Spouse name: Not on file   Number of children: Y   Years of education: Not on file   Highest education level: Not on file  Occupational History   Occupation: combine Optician, dispensing: Engineer, building services    Occupation: Control and instrumentation engineer  Tobacco Use   Smoking status: Never   Smokeless tobacco: Never  Vaping Use   Vaping Use: Never used  Substance and Sexual Activity   Alcohol use: No    Alcohol/week: 0.0 standard drinks   Drug use: No   Sexual activity: Yes    Partners: Male  Other Topics Concern   Not on  file  Social History Narrative   Regular Exercise- qd    Social Determinants of Health   Financial Resource Strain: Not on file  Food Insecurity: Not on file  Transportation Needs: Not on file  Physical Activity: Not on file  Stress: Not on file  Social Connections: Not on file  Intimate Partner Violence: Not on file    Outpatient Medications Prior to Visit  Medication Sig Dispense Refill   atorvastatin (LIPITOR) 40 MG tablet Take 1 tablet (40 mg total) by mouth at bedtime. 90 tablet 0   doxazosin (CARDURA) 1 MG tablet TAKE 1 TABLET (1 MG TOTAL) BY MOUTH DAILY. 90 tablet 0   hydrochlorothiazide (HYDRODIURIL) 25 MG tablet TAKE 1 TABLET (25 MG TOTAL) BY MOUTH DAILY. 30 tablet 10   metoprolol succinate (TOPROL-XL) 25 MG 24 hr tablet TAKE 1 TABLET (25 MG TOTAL) BY MOUTH DAILY. 90 tablet 1   NIFEdipine (PROCARDIA XL/NIFEDICAL XL) 60 MG 24 hr tablet TAKE 1 TABLET (60 MG TOTAL) BY MOUTH DAILY. 30 tablet 10   potassium chloride SA (KLOR-CON) 20 MEQ tablet TAKE 2 TABLETS (40 MEQ TOTAL) BY MOUTH DAILY. 180 tablet 3   sildenafil (VIAGRA) 100 MG tablet TAKE 0.5-1 TABLETS (50-100 MG TOTAL) BY MOUTH DAILY AS  NEEDED FOR ERECTILE DYSFUNCTION. 10 tablet 11   meloxicam (MOBIC) 15 MG tablet TAKE 1/2 TO 1 TABLET BY MOUTH DAILY AS NEEDED (Patient not taking: Reported on 03/30/2021) 30 tablet 0   Facility-Administered Medications Prior to Visit  Medication Dose Route Frequency Provider Last Rate Last Admin   0.9 %  sodium chloride infusion  500 mL Intravenous Continuous Irene Shipper, MD       0.9 %  sodium chloride infusion  500 mL Intravenous Once Irene Shipper, MD        Allergies  Allergen Reactions   Ace Inhibitors Other (See Comments)    angioedema    Review of Systems  Constitutional:  Negative for fever and malaise/fatigue.  HENT:  Negative for congestion.   Eyes:  Negative for blurred vision.  Respiratory:  Negative for cough and shortness of breath.   Cardiovascular:  Negative for chest pain, palpitations and leg swelling.  Gastrointestinal:  Negative for vomiting.  Musculoskeletal:  Positive for joint pain (right knee). Negative for back pain and falls.       (-)right knee swelling  Skin:  Negative for rash.  Neurological:  Negative for loss of consciousness and headaches.      Objective:    Physical Exam Vitals and nursing note reviewed.  Constitutional:      General: He is not in acute distress.    Appearance: Normal appearance. He is not ill-appearing.  HENT:     Head: Normocephalic and atraumatic.     Right Ear: External ear normal.     Left Ear: External ear normal.  Eyes:     Extraocular Movements: Extraocular movements intact.     Pupils: Pupils are equal, round, and reactive to light.  Cardiovascular:     Rate and Rhythm: Normal rate and regular rhythm.     Pulses: Normal pulses.     Heart sounds: Normal heart sounds. No murmur heard.   No gallop.  Pulmonary:     Effort: Pulmonary effort is normal. No respiratory distress.     Breath sounds: Normal breath sounds. No wheezing, rhonchi or rales.  Musculoskeletal:        General: Swelling, tenderness and signs of  injury present.  Right knee: No crepitus.     Comments: Pain weight bearing on right knee. No pain with palpation   Skin:    General: Skin is warm and dry.  Neurological:     Mental Status: He is alert and oriented to person, place, and time.  Psychiatric:        Behavior: Behavior normal.    BP 120/70 (BP Location: Left Arm, Patient Position: Sitting, Cuff Size: Large)   Pulse 62   Temp 98.8 F (37.1 C) (Oral)   Resp 18   Ht 6\' 1"  (1.854 m)   Wt 225 lb 6.4 oz (102.2 kg)   SpO2 97%   BMI 29.74 kg/m  Wt Readings from Last 3 Encounters:  03/30/21 225 lb 6.4 oz (102.2 kg)  11/14/20 228 lb (103.4 kg)  11/07/20 231 lb (104.8 kg)    Diabetic Foot Exam - Simple   No data filed    Lab Results  Component Value Date   WBC 4.1 05/05/2020   HGB 12.8 (L) 05/05/2020   HCT 38.0 (L) 05/05/2020   PLT 229.0 05/05/2020   GLUCOSE 87 12/01/2020   CHOL 117 11/16/2020   TRIG 76.0 11/16/2020   HDL 36.10 (L) 11/16/2020   LDLCALC 66 11/16/2020   ALT 14 11/16/2020   AST 19 11/16/2020   NA 140 12/01/2020   K 3.5 12/01/2020   CL 101 12/01/2020   CREATININE 1.48 12/01/2020   BUN 18 12/01/2020   CO2 33 (H) 12/01/2020   TSH 1.78 05/05/2020   PSA 2.35 05/05/2020   MICROALBUR 2.2 (H) 02/17/2013    Lab Results  Component Value Date   TSH 1.78 05/05/2020   Lab Results  Component Value Date   WBC 4.1 05/05/2020   HGB 12.8 (L) 05/05/2020   HCT 38.0 (L) 05/05/2020   MCV 87.0 05/05/2020   PLT 229.0 05/05/2020   Lab Results  Component Value Date   NA 140 12/01/2020   K 3.5 12/01/2020   CO2 33 (H) 12/01/2020   GLUCOSE 87 12/01/2020   BUN 18 12/01/2020   CREATININE 1.48 12/01/2020   BILITOT 0.5 11/16/2020   ALKPHOS 74 11/16/2020   AST 19 11/16/2020   ALT 14 11/16/2020   PROT 7.4 11/16/2020   ALBUMIN 4.1 11/16/2020   CALCIUM 9.5 12/01/2020   ANIONGAP 13 09/04/2014   GFR 52.75 (L) 12/01/2020   Lab Results  Component Value Date   CHOL 117 11/16/2020   Lab Results   Component Value Date   HDL 36.10 (L) 11/16/2020   Lab Results  Component Value Date   LDLCALC 66 11/16/2020   Lab Results  Component Value Date   TRIG 76.0 11/16/2020   Lab Results  Component Value Date   CHOLHDL 3 11/16/2020   No results found for: HGBA1C     Assessment & Plan:   Problem List Items Addressed This Visit   None Visit Diagnoses     Chronic pain of right knee    -  Primary   Relevant Orders   DG Knee Complete 4 Views Right     Con't with sleeve Ice at end of day Xray F/u sport med    No orders of the defined types were placed in this encounter.   I, Dr. Roma Schanz, DO, personally preformed the services described in this documentation.  All medical record entries made by the scribe were at my direction and in my presence.  I have reviewed the chart and discharge instructions (if applicable) and  agree that the record reflects my personal performance and is accurate and complete. 03/30/2021   I,[name],acting as a scribe for Ann Held, DO.,have documented all relevant documentation on the behalf of Ann Held, DO,as directed by  Ann Held, DO while in the presence of Ann Held, DO.   Ann Held, DO

## 2021-03-30 NOTE — Patient Instructions (Signed)

## 2021-04-03 ENCOUNTER — Telehealth: Payer: Self-pay

## 2021-04-03 DIAGNOSIS — G8929 Other chronic pain: Secondary | ICD-10-CM

## 2021-04-03 NOTE — Telephone Encounter (Signed)
Received a smooth transfer. Pt wanted to know what the next step is after the knee x-ray. I have informed him that he can call sports med and follow up with them since he is a pt of theirs as well. '  His knee pain is better, but that is with resting. He wants to be more active and is okay with the plan above. Pt did not want any injections of the knee at this time.   FYI to provider and please advise if there is any additional information that needs to be relayed to pt.

## 2021-04-04 NOTE — Addendum Note (Signed)
Addended by: Sanda Linger on: 04/04/2021 03:43 PM   Modules accepted: Orders

## 2021-04-04 NOTE — Telephone Encounter (Signed)
Spoke with patient. Pt verbalized understanding and states the doctor at sports medicine stated the patient needed a new referral. New referral placed

## 2021-04-12 ENCOUNTER — Other Ambulatory Visit (HOSPITAL_COMMUNITY): Payer: Self-pay

## 2021-04-12 ENCOUNTER — Other Ambulatory Visit: Payer: Self-pay | Admitting: Family Medicine

## 2021-04-12 DIAGNOSIS — I10 Essential (primary) hypertension: Secondary | ICD-10-CM

## 2021-04-12 MED ORDER — DOXAZOSIN MESYLATE 1 MG PO TABS
1.0000 mg | ORAL_TABLET | Freq: Every day | ORAL | 0 refills | Status: DC
  Filled 2021-04-12: qty 90, 90d supply, fill #0

## 2021-04-12 MED FILL — Nifedipine Tab ER 24HR Osmotic Release 60 MG: ORAL | 30 days supply | Qty: 30 | Fill #3 | Status: AC

## 2021-04-12 MED FILL — Hydrochlorothiazide Tab 25 MG: ORAL | 30 days supply | Qty: 30 | Fill #3 | Status: AC

## 2021-04-17 ENCOUNTER — Other Ambulatory Visit: Payer: Self-pay

## 2021-04-17 ENCOUNTER — Other Ambulatory Visit (HOSPITAL_COMMUNITY): Payer: Self-pay

## 2021-04-17 ENCOUNTER — Ambulatory Visit (INDEPENDENT_AMBULATORY_CARE_PROVIDER_SITE_OTHER): Payer: BC Managed Care – PPO | Admitting: Family Medicine

## 2021-04-17 ENCOUNTER — Encounter: Payer: Self-pay | Admitting: Family Medicine

## 2021-04-17 VITALS — BP 130/84 | Ht 73.0 in | Wt 220.0 lb

## 2021-04-17 DIAGNOSIS — M1711 Unilateral primary osteoarthritis, right knee: Secondary | ICD-10-CM | POA: Diagnosis not present

## 2021-04-17 MED ORDER — MELOXICAM 15 MG PO TABS
15.0000 mg | ORAL_TABLET | Freq: Every day | ORAL | 2 refills | Status: DC
  Filled 2021-04-17: qty 30, 30d supply, fill #0
  Filled 2021-05-18: qty 30, 30d supply, fill #1
  Filled 2021-07-11: qty 30, 30d supply, fill #2

## 2021-04-17 NOTE — Progress Notes (Signed)
   Bobby Castaneda. is a 55 y.o. male who presents to [facility] today for the following:  Right knee pain: Chronic right knee pain, previously saw Dr. Micheline Chapman for OA. Since March has been intermittently trying to get back to his normal activities with little success due to the recurrent pain after a few days of activity.  Worse with jumping activities, able to walk normally. Recently had x-rays completed (laying down). Does not really want to have any injections in his knee.  Pain mostly lateral in this knee.  PMH reviewed.  ROS as above. Medications reviewed.  Exam:  BP 130/84   Ht 6\' 1"  (1.854 m)   Wt 220 lb (99.8 kg)   BMI 29.03 kg/m  Gen: Well-appearing, NAD MSK:  Right Knee: - Inspection: no gross deformity. No swelling/effusion, erythema or bruising bilaterally. Skin intact - Palpation: TTP palpation over lateral knee joint line - ROM: full active ROM with flexion and extension in knee and hip. Pain in the lateral knee with with active and passive extension. Pain with valgus stress in extension. - Strength: 5/5 strength bilaterally - Neuro/vasc: NV intact bilaterally  - Special Tests: - LIGAMENTS: negative anterior and posterior drawer, negative Lachman's, no MCL or LCL laxity  -- MENISCUS: negative McMurray's, negative Thessaly  -- PF JOINT: nml patellar mobility bilaterally.  negative patellar grind, negative patellar apprehension   DG Knee Complete 4 Views Right  Result Date: 03/31/2021 CLINICAL DATA:  Chronic right knee pain. EXAM: RIGHT KNEE - COMPLETE 4+ VIEW COMPARISON:  November 07, 2020 FINDINGS: No evidence of an acute fracture, dislocation, or joint effusion. Marginal osteophytes are seen involving the distal right femur proximal right tibia. There is moderate severity medial tibiofemoral compartment space narrowing. Mild to moderate severity patellofemoral and lateral tibiofemoral compartment space narrowing is also seen. Soft tissues are unremarkable. IMPRESSION:  Stable degenerative changes without an acute osseous abnormality. Electronically Signed   By: Virgina [place] M.D.   On: 03/31/2021 23:16     Assessment and Plan: 1) Right knee pain - 2/2 known arthropathy.  Exam otherwise reassuring.  Discussed option.  Meloxicam, formal physical therapy.  Consider injection if pain becomes severe.  See instructions for further.   Jalee Saine, DO

## 2021-04-17 NOTE — Patient Instructions (Signed)
Your pain is due to arthritis. These are the different medications you can take for this: Meloxicam 15mg  daily with food for pain and inflammation. Tylenol 500mg  1-2 tabs three times a day for pain. Capsaicin, aspercreme, or biofreeze topically up to four times a day may also help with pain. Some supplements that may help for arthritis: Boswellia extract, curcumin, pycnogenol Cortisone injections are an option for severe pain. If cortisone injections do not help, there are different types of shots that may help but they take longer to take effect. It's important that you continue to stay active. Straight leg raises, knee extensions 3 sets of 10 once a day (add ankle weight if these become too easy). Start physical therapy to strengthen muscles around the joint that hurts to take pressure off of the joint itself. Shoe inserts with good arch support may be helpful. Heat or ice 15 minutes at a time 3-4 times a day as needed to help with pain. Water aerobics and cycling with low resistance are the best two types of exercise for arthritis though any exercise is ok as long as it doesn't worsen the pain. Follow up with me in 6 weeks.

## 2021-04-18 ENCOUNTER — Other Ambulatory Visit (HOSPITAL_COMMUNITY): Payer: Self-pay

## 2021-05-01 ENCOUNTER — Ambulatory Visit: Payer: BC Managed Care – PPO | Admitting: Physical Therapy

## 2021-05-08 ENCOUNTER — Encounter: Payer: Self-pay | Admitting: Family Medicine

## 2021-05-08 ENCOUNTER — Ambulatory Visit (INDEPENDENT_AMBULATORY_CARE_PROVIDER_SITE_OTHER): Payer: BC Managed Care – PPO | Admitting: Family Medicine

## 2021-05-08 ENCOUNTER — Other Ambulatory Visit: Payer: Self-pay

## 2021-05-08 VITALS — BP 120/80 | HR 53 | Temp 98.3°F | Resp 18 | Ht 73.0 in | Wt 227.8 lb

## 2021-05-08 DIAGNOSIS — Z125 Encounter for screening for malignant neoplasm of prostate: Secondary | ICD-10-CM | POA: Diagnosis not present

## 2021-05-08 DIAGNOSIS — N181 Chronic kidney disease, stage 1: Secondary | ICD-10-CM

## 2021-05-08 DIAGNOSIS — M25561 Pain in right knee: Secondary | ICD-10-CM

## 2021-05-08 DIAGNOSIS — Z0000 Encounter for general adult medical examination without abnormal findings: Secondary | ICD-10-CM

## 2021-05-08 DIAGNOSIS — Z23 Encounter for immunization: Secondary | ICD-10-CM | POA: Diagnosis not present

## 2021-05-08 DIAGNOSIS — E785 Hyperlipidemia, unspecified: Secondary | ICD-10-CM | POA: Diagnosis not present

## 2021-05-08 DIAGNOSIS — I10 Essential (primary) hypertension: Secondary | ICD-10-CM | POA: Diagnosis not present

## 2021-05-08 NOTE — Assessment & Plan Note (Signed)
Well controlled, no changes to meds. Encouraged heart healthy diet such as the DASH diet and exercise as tolerated.  °

## 2021-05-08 NOTE — Assessment & Plan Note (Signed)
Per sport med

## 2021-05-08 NOTE — Assessment & Plan Note (Signed)
Encourage heart healthy diet such as MIND or DASH diet, increase exercise, avoid trans fats, simple carbohydrates and processed foods, consider a krill or fish or flaxseed oil cap daily.  °

## 2021-05-08 NOTE — Progress Notes (Signed)
Subjective:   By signing my name below, I, Bobby Castaneda, attest that this documentation has been prepared under the direction and in the presence of Roma Schanz R Bobby Castaneda. 05/08/2021    Patient ID: Bobby Neer., male    DOB: Jun 09, 1966, 55 y.o.   MRN: [id number]  Chief Complaint  Patient presents with   Annual Exam    Concerns/ questions: none Pna: none Zoster: none Covid 3:     HPI Patient is in today for a comprehensive physical exam.  He is undergoing physical therapy for his knee and says it is helping with the pain on his right knee. He has been managing his blood pressure with 1 mg Cardura PO daily and has been doing well on it. BP Readings from Last 3 Encounters:  05/08/21 120/80  04/17/21 130/84  03/30/21 120/70    He has 2 Pfizer Covid-19 vaccines at his time. He is due for the shingles vaccines and is willing to get the 1st dose today. He is UTD on vision care. He is UTD on dental care. He denies fever, hearing loss, ear pain,congestion, sinus pain, sore throat, eye pain, chest pain, palpitations, cough, shortness of breath, wheezing, nausea. vomiting, diarrhea, constipation, blood in stool, dysuria,frequency, hematuria and headaches.   Past Medical History:  Diagnosis Date   Allergy    Bell's palsy    History   Hyperlipidemia    Hypertension    Sleep apnea    uses CPAP nightly    Past Surgical History:  Procedure Laterality Date   ADENOIDECTOMY  08/2011   Simeon Craft--- [facility]   CLOSED REDUCTION HAND FRACTURE  12/2010   Right hand   COLONOSCOPY     KNEE SURGERY     bilat scops   POLYPECTOMY      Family History  Problem Relation Age of Onset   Hypertension Mother    Cancer Mother 40       breast   Breast cancer Mother    Hypertension Father    Hyperlipidemia Father    Stroke Father    Cancer Father        prostate   Colon cancer Father        dx'd in his 32's   Hypertension Sister    Brain cancer Sister    Cancer Maternal Aunt 37        breast   Breast cancer Maternal Aunt    Heart disease Maternal Uncle        mi   Hypertension Maternal Uncle    Hypertension Maternal Grandmother    Alzheimer's disease Maternal Grandmother    Heart disease Maternal Grandfather        mi   Hypertension Maternal Grandfather    Hyperlipidemia Maternal Grandfather    Stroke Maternal Grandfather    Stroke Maternal Uncle    Hypertension Maternal Uncle    Hypertension Other    Colon polyps Neg Hx    Rectal cancer Neg Hx    Stomach cancer Neg Hx    Esophageal cancer Neg Hx     Social History   Socioeconomic History   Marital status: Married    Spouse name: Not on file   Number of children: Y   Years of education: Not on file   Highest education level: Not on file  Occupational History   Occupation: [employer] insurance    Employer: [employer]    Occupation: Control and instrumentation engineer  Tobacco Use   Smoking status: Never  Smokeless tobacco: Never  Vaping Use   Vaping Use: Never used  Substance and Sexual Activity   Alcohol use: No    Alcohol/week: 0.0 standard drinks   Drug use: No   Sexual activity: Yes    Partners: Male  Other Topics Concern   Not on file  Social History Narrative   Regular Exercise- qd    Social Determinants of Health   Financial Resource Strain: Not on file  Food Insecurity: Not on file  Transportation Needs: Not on file  Physical Activity: Not on file  Stress: Not on file  Social Connections: Not on file  Intimate Partner Violence: Not on file    Outpatient Medications Prior to Visit  Medication Sig Dispense Refill   atorvastatin (LIPITOR) 40 MG tablet Take 1 tablet (40 mg total) by mouth at bedtime. 90 tablet 0   doxazosin (CARDURA) 1 MG tablet Take 1 tablet (1 mg total) by mouth daily. 90 tablet 0   hydrochlorothiazide (HYDRODIURIL) 25 MG tablet TAKE 1 TABLET (25 MG TOTAL) BY MOUTH DAILY. 30 tablet 10   meloxicam (MOBIC) 15 MG tablet Take 1 tablet (15 mg total) by mouth daily. 30 tablet 2    metoprolol succinate (TOPROL-XL) 25 MG 24 hr tablet TAKE 1 TABLET (25 MG TOTAL) BY MOUTH DAILY. 90 tablet 1   NIFEdipine (PROCARDIA XL/NIFEDICAL XL) 60 MG 24 hr tablet TAKE 1 TABLET (60 MG TOTAL) BY MOUTH DAILY. 30 tablet 10   potassium chloride SA (KLOR-CON) 20 MEQ tablet TAKE 2 TABLETS (40 MEQ TOTAL) BY MOUTH DAILY. 180 tablet 3   sildenafil (VIAGRA) 100 MG tablet TAKE 0.5-1 TABLETS (50-100 MG TOTAL) BY MOUTH DAILY AS NEEDED FOR ERECTILE DYSFUNCTION. 10 tablet 11   Facility-Administered Medications Prior to Visit  Medication Dose Route Frequency Provider Last Rate Last Admin   0.9 %  sodium chloride infusion  500 mL Intravenous Continuous Irene Shipper, MD       0.9 %  sodium chloride infusion  500 mL Intravenous Once Irene Shipper, MD        Allergies  Allergen Reactions   Ace Inhibitors Other (See Comments)    angioedema    Review of Systems  Constitutional:  Negative for fever.  HENT:  Negative for congestion, ear pain, hearing loss, sinus pain and sore throat.   Eyes:  Negative for pain.  Respiratory:  Negative for cough, shortness of breath and wheezing.   Cardiovascular:  Negative for chest pain and palpitations.  Gastrointestinal:  Negative for blood in stool, constipation, diarrhea, nausea and vomiting.  Genitourinary:  Negative for dysuria, frequency and hematuria.  Musculoskeletal:  Positive for joint pain (right knee).  Neurological:  Negative for headaches.      Objective:    Physical Exam Constitutional:      General: He is not in acute distress.    Appearance: Normal appearance. He is not ill-appearing.  HENT:     Head: Normocephalic and atraumatic.     Right Ear: Tympanic membrane, ear canal and external ear normal.     Left Ear: Tympanic membrane, ear canal and external ear normal.  Eyes:     Extraocular Movements: Extraocular movements intact.     Pupils: Pupils are equal, round, and reactive to light.  Cardiovascular:     Rate and Rhythm: Normal rate  and regular rhythm.     Pulses: Normal pulses.     Heart sounds: Normal heart sounds. No murmur heard.   No gallop.  Pulmonary:  Effort: Pulmonary effort is normal. No respiratory distress.     Breath sounds: No wheezing, rhonchi or rales.  Abdominal:     General: Bowel sounds are normal. There is no distension.     Palpations: Abdomen is soft. There is no mass.     Tenderness: There is no abdominal tenderness. There is no guarding or rebound.     Hernia: No hernia is present.  Genitourinary:    Prostate: Normal.     Rectum: Guaiac result negative.  Musculoskeletal:     Cervical back: Normal range of motion and neck supple.  Lymphadenopathy:     Cervical: No cervical adenopathy.  Skin:    General: Skin is warm and dry.  Neurological:     Mental Status: He is alert and oriented to person, place, and time.  Psychiatric:        Behavior: Behavior normal.    BP 120/80 (BP Location: Left Arm, Patient Position: Sitting, Cuff Size: Large)   Pulse (!) 53   Temp 98.3 F (36.8 C) (Oral)   Resp 18   Ht '6\' 1"'$  (1.854 m)   Wt 227 lb 12.8 oz (103.3 kg)   SpO2 98%   BMI 30.05 kg/m  Wt Readings from Last 3 Encounters:  05/08/21 227 lb 12.8 oz (103.3 kg)  04/17/21 220 lb (99.8 kg)  03/30/21 225 lb 6.4 oz (102.2 kg)    Diabetic Foot Exam - Simple   No data filed    Lab Results  Component Value Date   WBC 4.1 05/05/2020   HGB 12.8 (L) 05/05/2020   HCT 38.0 (L) 05/05/2020   PLT 229.0 05/05/2020   GLUCOSE 87 12/01/2020   CHOL 117 11/16/2020   TRIG 76.0 11/16/2020   HDL 36.10 (L) 11/16/2020   LDLCALC 66 11/16/2020   ALT 14 11/16/2020   AST 19 11/16/2020   NA 140 12/01/2020   K 3.5 12/01/2020   CL 101 12/01/2020   CREATININE 1.48 12/01/2020   BUN 18 12/01/2020   CO2 33 (H) 12/01/2020   TSH 1.78 05/05/2020   PSA 2.35 05/05/2020   MICROALBUR 2.2 (H) 02/17/2013    Lab Results  Component Value Date   TSH 1.78 05/05/2020   Lab Results  Component Value Date   WBC 4.1  05/05/2020   HGB 12.8 (L) 05/05/2020   HCT 38.0 (L) 05/05/2020   MCV 87.0 05/05/2020   PLT 229.0 05/05/2020   Lab Results  Component Value Date   NA 140 12/01/2020   K 3.5 12/01/2020   CO2 33 (H) 12/01/2020   GLUCOSE 87 12/01/2020   BUN 18 12/01/2020   CREATININE 1.48 12/01/2020   BILITOT 0.5 11/16/2020   ALKPHOS 74 11/16/2020   AST 19 11/16/2020   ALT 14 11/16/2020   PROT 7.4 11/16/2020   ALBUMIN 4.1 11/16/2020   CALCIUM 9.5 12/01/2020   ANIONGAP 13 09/04/2014   GFR 52.75 (L) 12/01/2020   Lab Results  Component Value Date   CHOL 117 11/16/2020   Lab Results  Component Value Date   HDL 36.10 (L) 11/16/2020   Lab Results  Component Value Date   LDLCALC 66 11/16/2020   Lab Results  Component Value Date   TRIG 76.0 11/16/2020   Lab Results  Component Value Date   CHOLHDL 3 11/16/2020   No results found for: HGBA1C    Colonoscopy: Last completed on 05/20/2019. Results showed two 4 - 53m polyps in the cecum which were removed with a cold snare, Otherwise the  exam was normal. Repeat every 3 years.   Assessment & Plan:   Problem List Items Addressed This Visit       Unprioritized   Acute pain of right knee    Per sport med       Chronic kidney disease (CKD), stage I    Per nephrology       Essential hypertension    Well controlled, no changes to meds. Encouraged heart healthy diet such as the DASH diet and exercise as tolerated.        Hyperlipidemia    Encourage heart healthy diet such as MIND or DASH diet, increase exercise, avoid trans fats, simple carbohydrates and processed foods, consider a krill or fish or flaxseed oil cap daily.        Relevant Orders   Lipid panel   TSH   PSA   CBC with Differential/Platelet   Comprehensive metabolic panel   Preventative health care - Primary    ghm utd Check labs  See avs        Relevant Orders   Lipid panel   TSH   PSA   CBC with Differential/Platelet   Comprehensive metabolic panel    Other Visit Diagnoses     Need for shingles vaccine       Relevant Orders   Varicella-zoster vaccine IM (Completed)   Primary hypertension       Relevant Orders   Lipid panel   TSH   PSA   CBC with Differential/Platelet   Comprehensive metabolic panel       No orders of the defined types were placed in this encounter.    I,Bobby Castaneda,acting as a Education administrator for [employer], Bobby Castaneda.,have documented all relevant documentation on the behalf of Bobby Held, Bobby Castaneda,as directed by  Bobby Held, Bobby Castaneda while in the presence of Bobby Held, Bobby Castaneda.   I,Lowne Koren Shiver Bobby Castaneda. , personally preformed the services described in this documentation.  All medical record entries made by the scribe were at my direction and in my presence.  I have reviewed the chart and discharge instructions (if applicable) and agree that the record reflects my personal performance and is accurate and complete. 05/08/2021

## 2021-05-08 NOTE — Progress Notes (Signed)
Patient ID: Bobby Castaneda., male    DOB: 09-07-1966  Age: 55 y.o. MRN: [id number]    Subjective:  Subjective  HPI Bobby Castaneda. presents for a comprehensive physical examination today.    He denies any chest pain, SOB, fever, abdominal pain, cough, chills, sore throat, dysuria, urinary incontinence, back pain, HA, or N/V/D at this time.   Review of Systems  Constitutional:  Negative for chills, fatigue and fever.  HENT:  Negative for ear pain, rhinorrhea, sinus pressure, sinus pain, sore throat and tinnitus.   Eyes:  Negative for pain.  Respiratory:  Negative for cough, shortness of breath and wheezing.   Cardiovascular:  Negative for chest pain.  Gastrointestinal:  Negative for abdominal pain, anal bleeding, constipation, diarrhea, nausea and vomiting.  Genitourinary:  Negative for flank pain.  Musculoskeletal:  Negative for back pain and neck pain.  Skin:  Negative for rash.  Neurological:  Negative for seizures, weakness, light-headedness, numbness and headaches.   History Past Medical History:  Diagnosis Date   Allergy    Bell's palsy    History   Hyperlipidemia    Hypertension    Sleep apnea    uses CPAP nightly    He has a past surgical history that includes Knee surgery; Adenoidectomy (08/2011); Closed reduction hand fracture (12/2010); Colonoscopy; and Polypectomy.   His family history includes Alzheimer's disease in his maternal grandmother; Brain cancer in his sister; Breast cancer in his maternal aunt and mother; Cancer in his father; Cancer (age of onset: 24) in his maternal aunt; Cancer (age of onset: 23) in his mother; Colon cancer in his father; Heart disease in his maternal grandfather and maternal uncle; Hyperlipidemia in his father and maternal grandfather; Hypertension in his father, maternal grandfather, maternal grandmother, maternal uncle, maternal uncle, mother, sister, and another family member; Stroke in his father, maternal grandfather, and  maternal uncle.He reports that he has never smoked. He has never used smokeless tobacco. He reports that he does not drink alcohol and does not use drugs.  Current Outpatient Medications on File Prior to Visit  Medication Sig Dispense Refill   atorvastatin (LIPITOR) 40 MG tablet Take 1 tablet (40 mg total) by mouth at bedtime. 90 tablet 0   doxazosin (CARDURA) 1 MG tablet Take 1 tablet (1 mg total) by mouth daily. 90 tablet 0   hydrochlorothiazide (HYDRODIURIL) 25 MG tablet TAKE 1 TABLET (25 MG TOTAL) BY MOUTH DAILY. 30 tablet 10   meloxicam (MOBIC) 15 MG tablet Take 1 tablet (15 mg total) by mouth daily. 30 tablet 2   metoprolol succinate (TOPROL-XL) 25 MG 24 hr tablet TAKE 1 TABLET (25 MG TOTAL) BY MOUTH DAILY. 90 tablet 1   NIFEdipine (PROCARDIA XL/NIFEDICAL XL) 60 MG 24 hr tablet TAKE 1 TABLET (60 MG TOTAL) BY MOUTH DAILY. 30 tablet 10   potassium chloride SA (KLOR-CON) 20 MEQ tablet TAKE 2 TABLETS (40 MEQ TOTAL) BY MOUTH DAILY. 180 tablet 3   sildenafil (VIAGRA) 100 MG tablet TAKE 0.5-1 TABLETS (50-100 MG TOTAL) BY MOUTH DAILY AS NEEDED FOR ERECTILE DYSFUNCTION. 10 tablet 11   Current Facility-Administered Medications on File Prior to Visit  Medication Dose Route Frequency Provider Last Rate Last Admin   0.9 %  sodium chloride infusion  500 mL Intravenous Continuous Irene Shipper, MD       0.9 %  sodium chloride infusion  500 mL Intravenous Once Irene Shipper, MD         Objective:  Objective  Physical  Exam Vitals and nursing note reviewed.  Constitutional:      General: He is not in acute distress.    Appearance: Normal appearance. He is well-developed. He is not ill-appearing.  HENT:     Head: Normocephalic and atraumatic.     Right Ear: External ear normal.     Left Ear: External ear normal.     Nose: Nose normal.  Eyes:     General:        Right eye: No discharge.        Left eye: No discharge.     Extraocular Movements: Extraocular movements intact.     Pupils: Pupils  are equal, round, and reactive to light.  Cardiovascular:     Rate and Rhythm: Normal rate and regular rhythm.     Pulses: Normal pulses.     Heart sounds: Normal heart sounds. No murmur heard.   No friction rub. No gallop.  Pulmonary:     Effort: Pulmonary effort is normal. No respiratory distress.     Breath sounds: Normal breath sounds. No stridor. No wheezing, rhonchi or rales.  Chest:     Chest wall: No tenderness.  Abdominal:     General: Bowel sounds are normal. There is no distension.     Palpations: Abdomen is soft. There is no mass.     Tenderness: no abdominal tenderness There is no guarding or rebound.     Hernia: No hernia is present.  Musculoskeletal:        General: Normal range of motion.     Cervical back: Normal range of motion and neck supple.     Right lower leg: No edema.     Left lower leg: No edema.  Skin:    General: Skin is warm and dry.  Neurological:     Mental Status: He is alert and oriented to person, place, and time.  Psychiatric:        Behavior: Behavior normal.        Thought Content: Thought content normal.   BP 120/80 (BP Location: Left Arm, Patient Position: Sitting, Cuff Size: Large)   Pulse (!) 53   Temp 98.3 F (36.8 C) (Oral)   Resp 18   Ht '6\' 1"'$  (1.854 m)   Wt 227 lb 12.8 oz (103.3 kg)   SpO2 98%   BMI 30.05 kg/m  Wt Readings from Last 3 Encounters:  05/08/21 227 lb 12.8 oz (103.3 kg)  04/17/21 220 lb (99.8 kg)  03/30/21 225 lb 6.4 oz (102.2 kg)     Lab Results  Component Value Date   WBC 4.1 05/05/2020   HGB 12.8 (L) 05/05/2020   HCT 38.0 (L) 05/05/2020   PLT 229.0 05/05/2020   GLUCOSE 87 12/01/2020   CHOL 117 11/16/2020   TRIG 76.0 11/16/2020   HDL 36.10 (L) 11/16/2020   LDLCALC 66 11/16/2020   ALT 14 11/16/2020   AST 19 11/16/2020   NA 140 12/01/2020   K 3.5 12/01/2020   CL 101 12/01/2020   CREATININE 1.48 12/01/2020   BUN 18 12/01/2020   CO2 33 (H) 12/01/2020   TSH 1.78 05/05/2020   PSA 2.35 05/05/2020    MICROALBUR 2.2 (H) 02/17/2013    DG Knee Complete 4 Views Right  Result Date: 03/31/2021 CLINICAL DATA:  Chronic right knee pain. EXAM: RIGHT KNEE - COMPLETE 4+ VIEW COMPARISON:  November 07, 2020 FINDINGS: No evidence of an acute fracture, dislocation, or joint effusion. Marginal osteophytes are seen involving the distal right  femur proximal right tibia. There is moderate severity medial tibiofemoral compartment space narrowing. Mild to moderate severity patellofemoral and lateral tibiofemoral compartment space narrowing is also seen. Soft tissues are unremarkable. IMPRESSION: Stable degenerative changes without an acute osseous abnormality. Electronically Signed   By: Virgina Norfolk M.D.   On: 03/31/2021 23:16     Assessment & Plan:  Plan    No orders of the defined types were placed in this encounter.   Problem List Items Addressed This Visit       Unprioritized   Acute pain of right knee    Per sport med       Chronic kidney disease (CKD), stage I    Per nephrology       Essential hypertension    Well controlled, no changes to meds. Encouraged heart healthy diet such as the DASH diet and exercise as tolerated.        Hyperlipidemia    Encourage heart healthy diet such as MIND or DASH diet, increase exercise, avoid trans fats, simple carbohydrates and processed foods, consider a krill or fish or flaxseed oil cap daily.        Relevant Orders   Lipid panel   TSH   PSA   CBC with Differential/Platelet   Comprehensive metabolic panel   Preventative health care - Primary    ghm utd Check labs  See avs        Relevant Orders   Lipid panel   TSH   PSA   CBC with Differential/Platelet   Comprehensive metabolic panel   Other Visit Diagnoses     Need for shingles vaccine       Relevant Orders   Varicella-zoster vaccine IM (Completed)   Primary hypertension       Relevant Orders   Lipid panel   TSH   PSA   CBC with Differential/Platelet   Comprehensive  metabolic panel       Colonoscopy: Last completed on 05/20/2019, polyps noted, repeat every 3 years.    Follow-up: Return in about 6 months (around 11/08/2021) for hypertension, hyperlipidemia.   I,Gordon Zheng,acting as a Education administrator for [employer], DO.,have documented all relevant documentation on the behalf of Ann Held, DO,as directed by  Ann Held, DO while in the presence of [place], DO, have reviewed all documentation for this visit. The documentation on 05/08/21 for the exam, diagnosis, procedures, and orders are all accurate and complete.

## 2021-05-08 NOTE — Patient Instructions (Signed)
Preventive Care 56-56 Years Old, Male Preventive care refers to lifestyle choices and visits with your health care provider that can promote health and wellness. This includes: A yearly physical exam. This is also called an annual wellness visit. Regular dental and eye exams. Immunizations. Screening for certain conditions. Healthy lifestyle choices, such as: Eating a healthy diet. Getting regular exercise. Not using drugs or products that contain nicotine and tobacco. Limiting alcohol use. What can I expect for my preventive care visit? Physical exam Your health care provider will check your: Height and weight. These may be used to calculate your BMI (body mass index). BMI is a measurement that tells if you are at a healthy weight. Heart rate and blood pressure. Body temperature. Skin for abnormal spots. Counseling Your health care provider may ask you questions about your: Past medical problems. Family's medical history. Alcohol, tobacco, and drug use. Emotional well-being. Home life and relationship well-being. Sexual activity. Diet, exercise, and sleep habits. Work and work environment. Access to firearms. What immunizations do I need?  Vaccines are usually given at various ages, according to a schedule. Your health care provider will recommend vaccines for you based on your age, medicalhistory, and lifestyle or other factors, such as travel or where you work. What tests do I need? Blood tests Lipid and cholesterol levels. These may be checked every 5 years, or more often if you are over 56 years old. Hepatitis C test. Hepatitis B test. Screening Lung cancer screening. You may have this screening every year starting at age 56 if you have a 30-pack-year history of smoking and currently smoke or have quit within the past 15 years. Prostate cancer screening. Recommendations will vary depending on your family history and other risks. Genital exam to check for testicular cancer  or hernias. Colorectal cancer screening. All adults should have this screening starting at age 56 and continuing until age 75. Your health care provider may recommend screening at age 56 if you are at increased risk. You will have tests every 1-10 years, depending on your results and the type of screening test. Diabetes screening. This is done by checking your blood sugar (glucose) after you have not eaten for a while (fasting). You may have this done every 1-3 years. STD (sexually transmitted disease) testing, if you are at risk. Follow these instructions at home: Eating and drinking  Eat a diet that includes fresh fruits and vegetables, whole grains, lean protein, and low-fat dairy products. Take vitamin and mineral supplements as recommended by your health care provider. Do not drink alcohol if your health care provider tells you not to drink. If you drink alcohol: Limit how much you have to 0-2 drinks a day. Be aware of how much alcohol is in your drink. In the [place] one drink equals one 12 oz bottle of beer (355 mL), one 5 oz glass of wine (148 mL), or one 1 oz glass of hard liquor (44 mL).  Lifestyle Take daily care of your teeth and gums. Brush your teeth every morning and night with fluoride toothpaste. Floss one time each day. Stay active. Exercise for at least 30 minutes 5 or more days each week. Do not use any products that contain nicotine or tobacco, such as cigarettes, e-cigarettes, and chewing tobacco. If you need help quitting, ask your health care provider. Do not use drugs. If you are sexually active, practice safe sex. Use a condom or other form of protection to prevent STIs (sexually transmitted infections). If told by   your health care provider, take low-dose aspirin daily starting at age 56. Find healthy ways to cope with stress, such as: Meditation, yoga, or listening to music. Journaling. Talking to a trusted person. Spending time with friends and  family. Safety Always wear your seat belt while driving or riding in a vehicle. Do not drive: If you have been drinking alcohol. Do not ride with someone who has been drinking. When you are tired or distracted. While texting. Wear a helmet and other protective equipment during sports activities. If you have firearms in your house, make sure you follow all gun safety procedures. What's next? Go to your health care provider once a year for an annual wellness visit. Ask your health care provider how often you should have your eyes and teeth checked. Stay up to date on all vaccines. This information is not intended to replace advice given to you by your health care provider. Make sure you discuss any questions you have with your healthcare provider. Document Revised: 06/23/2019 Document Reviewed: 09/18/2018 Elsevier Patient Education  2022 [employer] Inc.  

## 2021-05-08 NOTE — Assessment & Plan Note (Signed)
ghm utd Check labs  See avs  

## 2021-05-08 NOTE — Assessment & Plan Note (Signed)
Per nephrology 

## 2021-05-09 LAB — LIPID PANEL
Cholesterol: 142 mg/dL (ref 0–200)
HDL: 41.7 mg/dL (ref >39.00)
LDL Cholesterol: 73 mg/dL (ref 0–99)
NonHDL: 99.92
Total CHOL/HDL Ratio: 3
Triglycerides: 137 mg/dL (ref 0.0–149.0)
VLDL: 27.4 mg/dL (ref 0.0–40.0)

## 2021-05-09 LAB — CBC WITH DIFFERENTIAL/PLATELET
Basophils Absolute: 0 10*3/uL (ref 0.0–0.1)
Basophils Relative: 0.8 % (ref 0.0–3.0)
Eosinophils Absolute: 0.2 10*3/uL (ref 0.0–0.7)
Eosinophils Relative: 4.5 % (ref 0.0–5.0)
HCT: 37.4 % — ABNORMAL LOW (ref 39.0–52.0)
Hemoglobin: 12.6 g/dL — ABNORMAL LOW (ref 13.0–17.0)
Lymphocytes Relative: 46.1 % — ABNORMAL HIGH (ref 12.0–46.0)
Lymphs Abs: 2 10*3/uL (ref 0.7–4.0)
MCHC: 33.6 g/dL (ref 30.0–36.0)
MCV: 87.8 fl (ref 78.0–100.0)
Monocytes Absolute: 0.5 10*3/uL (ref 0.1–1.0)
Monocytes Relative: 11 % (ref 3.0–12.0)
Neutro Abs: 1.6 10*3/uL (ref 1.4–7.7)
Neutrophils Relative %: 37.6 % — ABNORMAL LOW (ref 43.0–77.0)
Platelets: 205 10*3/uL (ref 150.0–400.0)
RBC: 4.27 Mil/uL (ref 4.22–5.81)
RDW: 13.4 % (ref 11.5–15.5)
WBC: 4.4 10*3/uL (ref 4.0–10.5)

## 2021-05-09 LAB — COMPREHENSIVE METABOLIC PANEL
ALT: 15 U/L (ref 0–53)
AST: 20 U/L (ref 0–37)
Albumin: 4.2 g/dL (ref 3.5–5.2)
Alkaline Phosphatase: 72 U/L (ref 39–117)
BUN: 21 mg/dL (ref 6–23)
CO2: 31 mEq/L (ref 19–32)
Calcium: 9.3 mg/dL (ref 8.4–10.5)
Chloride: 101 mEq/L (ref 96–112)
Creatinine, Ser: 1.51 mg/dL — ABNORMAL HIGH (ref 0.40–1.50)
GFR: 51.34 mL/min — ABNORMAL LOW (ref >60.00)
Glucose, Bld: 86 mg/dL (ref 70–99)
Potassium: 3.4 mEq/L — ABNORMAL LOW (ref 3.5–5.1)
Sodium: 141 mEq/L (ref 135–145)
Total Bilirubin: 0.5 mg/dL (ref 0.2–1.2)
Total Protein: 7.7 g/dL (ref 6.0–8.3)

## 2021-05-09 LAB — TSH: TSH: 1.59 u[IU]/mL (ref 0.35–5.50)

## 2021-05-09 LAB — PSA: PSA: 2.83 ng/mL (ref 0.10–4.00)

## 2021-05-11 ENCOUNTER — Other Ambulatory Visit (HOSPITAL_COMMUNITY): Payer: Self-pay

## 2021-05-11 MED FILL — Potassium Chloride Microencapsulated Crys ER Tab 20 mEq: ORAL | 90 days supply | Qty: 180 | Fill #1 | Status: AC

## 2021-05-12 ENCOUNTER — Other Ambulatory Visit (HOSPITAL_COMMUNITY): Payer: Self-pay

## 2021-05-12 MED FILL — Nifedipine Tab ER 24HR Osmotic Release 60 MG: ORAL | 30 days supply | Qty: 30 | Fill #4 | Status: AC

## 2021-05-12 MED FILL — Hydrochlorothiazide Tab 25 MG: ORAL | 30 days supply | Qty: 30 | Fill #4 | Status: AC

## 2021-05-16 ENCOUNTER — Encounter: Payer: Self-pay | Admitting: *Deleted

## 2021-05-18 ENCOUNTER — Other Ambulatory Visit: Payer: Self-pay | Admitting: Family Medicine

## 2021-05-18 ENCOUNTER — Other Ambulatory Visit (HOSPITAL_COMMUNITY): Payer: Self-pay

## 2021-05-18 MED ORDER — ATORVASTATIN CALCIUM 40 MG PO TABS
40.0000 mg | ORAL_TABLET | Freq: Every day | ORAL | 1 refills | Status: DC
  Filled 2021-05-18: qty 90, 90d supply, fill #0
  Filled 2021-08-24: qty 90, 90d supply, fill #1

## 2021-05-29 ENCOUNTER — Ambulatory Visit: Payer: BC Managed Care – PPO | Admitting: Family Medicine

## 2021-06-12 MED FILL — Hydrochlorothiazide Tab 25 MG: ORAL | 30 days supply | Qty: 30 | Fill #5 | Status: AC

## 2021-06-12 MED FILL — Nifedipine Tab ER 24HR Osmotic Release 60 MG: ORAL | 30 days supply | Qty: 30 | Fill #5 | Status: AC

## 2021-06-13 ENCOUNTER — Other Ambulatory Visit (HOSPITAL_COMMUNITY): Payer: Self-pay

## 2021-07-11 ENCOUNTER — Other Ambulatory Visit (HOSPITAL_COMMUNITY): Payer: Self-pay

## 2021-07-11 ENCOUNTER — Other Ambulatory Visit: Payer: Self-pay | Admitting: Family Medicine

## 2021-07-11 DIAGNOSIS — I10 Essential (primary) hypertension: Secondary | ICD-10-CM

## 2021-07-11 MED FILL — Nifedipine Tab ER 24HR Osmotic Release 60 MG: ORAL | 30 days supply | Qty: 30 | Fill #6 | Status: AC

## 2021-07-11 MED FILL — Hydrochlorothiazide Tab 25 MG: ORAL | 30 days supply | Qty: 30 | Fill #6 | Status: AC

## 2021-07-12 ENCOUNTER — Other Ambulatory Visit (HOSPITAL_COMMUNITY): Payer: Self-pay

## 2021-07-12 ENCOUNTER — Ambulatory Visit (INDEPENDENT_AMBULATORY_CARE_PROVIDER_SITE_OTHER): Payer: BC Managed Care – PPO | Admitting: *Deleted

## 2021-07-12 ENCOUNTER — Other Ambulatory Visit: Payer: Self-pay

## 2021-07-12 DIAGNOSIS — Z23 Encounter for immunization: Secondary | ICD-10-CM

## 2021-07-12 MED ORDER — DOXAZOSIN MESYLATE 1 MG PO TABS
1.0000 mg | ORAL_TABLET | Freq: Every day | ORAL | 1 refills | Status: DC
  Filled 2021-07-12: qty 90, 90d supply, fill #0
  Filled 2021-10-19: qty 90, 90d supply, fill #1

## 2021-07-12 NOTE — Progress Notes (Signed)
Patient her for second shingles vaccine.  Vaccine given in left deltoid and patient tolerated well.

## 2021-07-24 ENCOUNTER — Other Ambulatory Visit: Payer: Self-pay | Admitting: Cardiology

## 2021-07-24 MED FILL — Sildenafil Citrate Tab 100 MG: ORAL | 10 days supply | Qty: 10 | Fill #1 | Status: AC

## 2021-07-25 ENCOUNTER — Other Ambulatory Visit (HOSPITAL_COMMUNITY): Payer: Self-pay

## 2021-07-25 MED ORDER — POTASSIUM CHLORIDE CRYS ER 20 MEQ PO TBCR
40.0000 meq | EXTENDED_RELEASE_TABLET | Freq: Every day | ORAL | 0 refills | Status: DC
  Filled 2021-07-25: qty 60, 30d supply, fill #0

## 2021-08-14 ENCOUNTER — Other Ambulatory Visit (HOSPITAL_COMMUNITY): Payer: Self-pay

## 2021-08-14 MED FILL — Nifedipine Tab ER 24HR Osmotic Release 60 MG: ORAL | 30 days supply | Qty: 30 | Fill #7 | Status: AC

## 2021-08-14 MED FILL — Hydrochlorothiazide Tab 25 MG: ORAL | 30 days supply | Qty: 30 | Fill #7 | Status: AC

## 2021-08-17 ENCOUNTER — Other Ambulatory Visit: Payer: Self-pay | Admitting: Family Medicine

## 2021-08-17 ENCOUNTER — Other Ambulatory Visit (HOSPITAL_COMMUNITY): Payer: Self-pay

## 2021-08-18 ENCOUNTER — Other Ambulatory Visit (HOSPITAL_COMMUNITY): Payer: Self-pay

## 2021-08-21 ENCOUNTER — Other Ambulatory Visit (HOSPITAL_COMMUNITY): Payer: Self-pay

## 2021-08-22 ENCOUNTER — Other Ambulatory Visit (HOSPITAL_COMMUNITY): Payer: Self-pay

## 2021-08-22 MED ORDER — MELOXICAM 15 MG PO TABS
15.0000 mg | ORAL_TABLET | Freq: Every day | ORAL | 0 refills | Status: DC
  Filled 2021-08-22: qty 30, 30d supply, fill #0

## 2021-08-24 ENCOUNTER — Other Ambulatory Visit (HOSPITAL_COMMUNITY): Payer: Self-pay

## 2021-08-28 ENCOUNTER — Telehealth: Payer: Self-pay | Admitting: Family Medicine

## 2021-08-28 ENCOUNTER — Other Ambulatory Visit (HOSPITAL_COMMUNITY): Payer: Self-pay

## 2021-08-28 ENCOUNTER — Other Ambulatory Visit: Payer: Self-pay | Admitting: Family Medicine

## 2021-08-28 DIAGNOSIS — M62838 Other muscle spasm: Secondary | ICD-10-CM

## 2021-08-28 MED ORDER — METHOCARBAMOL 500 MG PO TABS
500.0000 mg | ORAL_TABLET | Freq: Four times a day (QID) | ORAL | 1 refills | Status: DC
  Filled 2021-08-28: qty 45, 12d supply, fill #0

## 2021-08-28 MED ORDER — POTASSIUM CHLORIDE CRYS ER 20 MEQ PO TBCR
60.0000 meq | EXTENDED_RELEASE_TABLET | Freq: Every day | ORAL | 6 refills | Status: DC
  Filled 2021-08-28: qty 30, 10d supply, fill #0

## 2021-08-28 NOTE — Telephone Encounter (Signed)
Pt called. LDVM 

## 2021-08-28 NOTE — Telephone Encounter (Signed)
Patient called because he pulled a right back muscle when he picked something up quickly. He states that it's not severe pain but it is bothersome so he would like a muscle relaxer to help. He was offered a OV but he stated he doesn't want to come in for something so small, and would just like a muscle relaxer. Please advice.

## 2021-09-14 ENCOUNTER — Encounter: Payer: Self-pay | Admitting: Cardiology

## 2021-09-14 ENCOUNTER — Ambulatory Visit (INDEPENDENT_AMBULATORY_CARE_PROVIDER_SITE_OTHER): Payer: BC Managed Care – PPO | Admitting: Cardiology

## 2021-09-14 ENCOUNTER — Other Ambulatory Visit (HOSPITAL_COMMUNITY): Payer: Self-pay

## 2021-09-14 ENCOUNTER — Other Ambulatory Visit: Payer: Self-pay

## 2021-09-14 VITALS — BP 124/66 | HR 56 | Ht 73.0 in | Wt 232.0 lb

## 2021-09-14 DIAGNOSIS — I10 Essential (primary) hypertension: Secondary | ICD-10-CM | POA: Diagnosis not present

## 2021-09-14 MED ORDER — POTASSIUM CHLORIDE CRYS ER 20 MEQ PO TBCR
60.0000 meq | EXTENDED_RELEASE_TABLET | Freq: Every day | ORAL | 6 refills | Status: DC
  Filled 2021-09-14: qty 30, 10d supply, fill #0
  Filled 2021-10-03: qty 90, 30d supply, fill #1

## 2021-09-14 NOTE — Patient Instructions (Addendum)
Medication Instructions:  Your physician recommends that you continue on your current medications as directed. Please refer to the Current Medication list given to you today. *If you need a refill on your cardiac medications before your next appointment, please call your pharmacy*  Lab Work: None. If you have labs (blood work) drawn today and your tests are completely normal, you will receive your results only by: [place] (if you have MyChart) OR A paper copy in the mail If you have any lab test that is abnormal or we need to change your treatment, we will call you to review the results.  Testing/Procedures: None.  Follow-Up: At [facility], you and your health needs are our priority.  As part of our continuing mission to provide you with exceptional heart care, we have created designated Provider Care Teams.  These Care Teams include your primary Cardiologist (physician) and Advanced Practice Providers (APPs -  Physician Assistants and Nurse Practitioners) who all work together to provide you with the care you need, when you need it.  Your physician wants you to follow-up in: As needed with  Lars Mage, MD   We recommend signing up for the patient portal called "MyChart".  Sign up information is provided on this After Visit Summary.  MyChart is used to connect with patients for Virtual Visits (Telemedicine).  Patients are able to view lab/test results, encounter notes, upcoming appointments, etc.  Non-urgent messages can be sent to your provider as well.   To learn more about what you can do with MyChart, go to [email or web address].    Any Other Special Instructions Will Be Listed Below (If Applicable).

## 2021-09-14 NOTE — Progress Notes (Signed)
Electrophysiology Office Follow up Visit Note:    Date:  09/14/2021   ID:  Bobby Neer., DOB 15-Aug-1966, MRN [id number]  PCP:  Carollee Herter, [place] Cardiologist:  None  [facility] Electrophysiologist:  Vickie Epley, MD    Interval History:    Bobby Shankland. is a 55 y.o. male who presents for a follow up visit.  I last saw the patient September 27, 2020 with hypertension and chest pain. He is maintained on metoprolol, hydrochlorothiazide, Cardura, and nifedipine for hypertension. His stress test showed no evidence of heart artery disease.  He tells me that he has done well since I last him. No cardiac complaints today.  He checks his blood pressures intermittently at home.  He tells me his systolic blood pressures run in the 1 20-1 30 range.  No problems with his medication regimen.    Past Medical History:  Diagnosis Date   Allergy    Bell's palsy    History   Hyperlipidemia    Hypertension    Sleep apnea    uses CPAP nightly    Past Surgical History:  Procedure Laterality Date   ADENOIDECTOMY  08/2011   Simeon Craft--- [facility]so ent   CLOSED REDUCTION HAND FRACTURE  12/2010   Right hand   COLONOSCOPY     KNEE SURGERY     bilat scops   POLYPECTOMY      Current Medications: Current Meds  Medication Sig   atorvastatin (LIPITOR) 40 MG tablet Take 1 tablet (40 mg total) by mouth at bedtime.   doxazosin (CARDURA) 1 MG tablet Take 1 tablet (1 mg total) by mouth daily.   hydrochlorothiazide (HYDRODIURIL) 25 MG tablet TAKE 1 TABLET (25 MG TOTAL) BY MOUTH DAILY.   meloxicam (MOBIC) 15 MG tablet Take 1 tablet (15 mg total) by mouth daily.   metoprolol succinate (TOPROL-XL) 25 MG 24 hr tablet TAKE 1 TABLET (25 MG TOTAL) BY MOUTH DAILY.   NIFEdipine (PROCARDIA XL/NIFEDICAL XL) 60 MG 24 hr tablet TAKE 1 TABLET (60 MG TOTAL) BY MOUTH DAILY.   sildenafil (VIAGRA) 100 MG tablet TAKE 0.5-1 TABLETS (50-100 MG TOTAL) BY MOUTH DAILY AS NEEDED FOR ERECTILE  DYSFUNCTION.   [DISCONTINUED] potassium chloride SA (KLOR-CON) 20 MEQ tablet Take 3 tablets (60 mEq total) by mouth daily.   Current Facility-Administered Medications for the 09/14/21 encounter (Office Visit) with Vickie Epley, MD  Medication   0.9 %  sodium chloride infusion   0.9 %  sodium chloride infusion     Allergies:   Ace inhibitors   Social History   Socioeconomic History   Marital status: Married    Spouse name: Not on file   Number of children: Y   Years of education: Not on file   Highest education level: Not on file  Occupational History   Occupation: combine Optician, dispensing: Engineer, building services    Occupation: Control and instrumentation engineer  Tobacco Use   Smoking status: Never   Smokeless tobacco: Never  Vaping Use   Vaping Use: Never used  Substance and Sexual Activity   Alcohol use: No    Alcohol/week: 0.0 standard drinks   Drug use: No   Sexual activity: Yes    Partners: Male  Other Topics Concern   Not on file  Social History Narrative   Regular Exercise- qd    Social Determinants of Health   Financial Resource Strain: Not on file  Food Insecurity: Not on file  Transportation Needs:  Not on file  Physical Activity: Not on file  Stress: Not on file  Social Connections: Not on file     Family History: The patient's family history includes Alzheimer's disease in his maternal grandmother; Brain cancer in his sister; Breast cancer in his maternal aunt and mother; Cancer in his father; Cancer (age of onset: 32) in his maternal aunt; Cancer (age of onset: 50) in his mother; Colon cancer in his father; Heart disease in his maternal grandfather and maternal uncle; Hyperlipidemia in his father and maternal grandfather; Hypertension in his father, maternal grandfather, maternal grandmother, maternal uncle, maternal uncle, mother, sister, and another family member; Stroke in his father, maternal grandfather, and maternal uncle. There is no history of Colon polyps,  Rectal cancer, Stomach cancer, or Esophageal cancer.  ROS:   Please see the history of present illness.    All other systems reviewed and are negative.  EKGs/Labs/Other Studies Reviewed:    The following studies were reviewed today:   EKG:  The ekg ordered today demonstrates sinus rhythm. 1st degree av delay.  Recent Labs: 09/27/2020: Magnesium 2.0 05/08/2021: ALT 15; BUN 21; Creatinine, Ser 1.51; Hemoglobin 12.6; Platelets 205.0; Potassium 3.4; Sodium 141; TSH 1.59  Recent Lipid Panel    Component Value Date/Time   CHOL 142 05/08/2021 1550   TRIG 137.0 05/08/2021 1550   TRIG 59 08/15/2006 1121   HDL 41.70 05/08/2021 1550   CHOLHDL 3 05/08/2021 1550   VLDL 27.4 05/08/2021 1550   LDLCALC 73 05/08/2021 1550   LDLCALC 82 01/09/2019 1509    Physical Exam:    VS:  BP 124/66   Pulse (!) 56   Ht 6\' 1"  (1.854 m)   Wt 232 lb (105.2 kg)   SpO2 97%   BMI 30.61 kg/m     Wt Readings from Last 3 Encounters:  09/14/21 232 lb (105.2 kg)  05/08/21 227 lb 12.8 oz (103.3 kg)  04/17/21 220 lb (99.8 kg)     GEN:  Well nourished, well developed in no acute distress HEENT: Normal NECK: No JVD; No carotid bruits LYMPHATICS: No lymphadenopathy CARDIAC: RRR, no murmurs, rubs, gallops RESPIRATORY:  Clear to auscultation without rales, wheezing or rhonchi  ABDOMEN: Soft, non-tender, non-distended MUSCULOSKELETAL:  No edema; No deformity  SKIN: Warm and dry NEUROLOGIC:  Alert and oriented x 3 PSYCHIATRIC:  Normal affect        ASSESSMENT:    1. Essential hypertension    PLAN:    In order of problems listed above:  #Hypertension Controlled.  On Cardura, hydrochlorothiazide, metoprolol, nifedipine.  I would recommend continuing the above regimen.  I have asked him to take his blood pressure cuff from home to local fire department or his primary care physician to validate the measurement.  He should continue to check his blood pressures 1-2 times per week to make sure they are  staying under control.  He should continue to aerobically exercise 4 days a week for at least 30 minutes.  He can follow-up with [place] on an as-needed basis.    Medication Adjustments/Labs and Tests Ordered: Current medicines are reviewed at length with the patient today.  Concerns regarding medicines are outlined above.  Orders Placed This Encounter  Procedures   EKG 12-Lead    Meds ordered this encounter  Medications   potassium chloride SA (KLOR-CON M) 20 MEQ tablet    Sig: Take 3 tablets (60 mEq total) by mouth daily.    Dispense:  30 tablet    Refill:  6      Signed, Lars Mage, MD, [facility], [facility] 09/14/2021 4:30 PM    Electrophysiology [facility]

## 2021-09-15 ENCOUNTER — Other Ambulatory Visit (HOSPITAL_COMMUNITY): Payer: Self-pay

## 2021-09-15 MED FILL — Hydrochlorothiazide Tab 25 MG: ORAL | 30 days supply | Qty: 30 | Fill #8 | Status: AC

## 2021-09-15 MED FILL — Nifedipine Tab ER 24HR Osmotic Release 60 MG: ORAL | 30 days supply | Qty: 30 | Fill #8 | Status: AC

## 2021-09-28 ENCOUNTER — Ambulatory Visit: Payer: BC Managed Care – PPO | Attending: Internal Medicine

## 2021-09-28 DIAGNOSIS — Z23 Encounter for immunization: Secondary | ICD-10-CM

## 2021-09-28 NOTE — Progress Notes (Signed)
° °  [id number]  Name:  Bobby Castaneda.    MRN: [id number] DOB: [birth year]  09/28/2021  [name] was observed post Covid-19 immunization for 15 minutes without incident. He was provided with Vaccine Information Sheet and instruction to access the V-Safe system.   [name] was instructed to call 911 with any severe reactions post vaccine: Difficulty breathing  Swelling of face and throat  A fast heartbeat  A bad rash all over body  Dizziness and weakness   Immunizations Administered     Name Date Dose VIS Date Route   Pfizer Covid-19 Vaccine Bivalent Booster 09/28/2021  1:51 PM 0.3 mL 06/07/2021 Intramuscular   Manufacturer: [place]   Lot: [id number]   [place]: [phone]

## 2021-09-29 ENCOUNTER — Other Ambulatory Visit (HOSPITAL_BASED_OUTPATIENT_CLINIC_OR_DEPARTMENT_OTHER): Payer: Self-pay

## 2021-09-29 MED ORDER — PFIZER COVID-19 VAC BIVALENT 30 MCG/0.3ML IM SUSP
INTRAMUSCULAR | 0 refills | Status: DC
  Filled 2021-09-29: qty 0.3, 1d supply, fill #0

## 2021-10-03 ENCOUNTER — Other Ambulatory Visit (HOSPITAL_COMMUNITY): Payer: Self-pay

## 2021-10-03 ENCOUNTER — Other Ambulatory Visit: Payer: Self-pay | Admitting: Family Medicine

## 2021-10-04 ENCOUNTER — Other Ambulatory Visit (HOSPITAL_COMMUNITY): Payer: Self-pay

## 2021-10-19 ENCOUNTER — Other Ambulatory Visit (HOSPITAL_COMMUNITY): Payer: Self-pay

## 2021-10-19 ENCOUNTER — Other Ambulatory Visit: Payer: Self-pay | Admitting: Cardiology

## 2021-10-19 MED ORDER — HYDROCHLOROTHIAZIDE 25 MG PO TABS
25.0000 mg | ORAL_TABLET | Freq: Every day | ORAL | 10 refills | Status: DC
  Filled 2021-10-19: qty 30, 30d supply, fill #0
  Filled 2021-11-16: qty 30, 30d supply, fill #1
  Filled 2021-12-16: qty 30, 30d supply, fill #2
  Filled 2022-01-23: qty 30, 30d supply, fill #3
  Filled 2022-02-22: qty 30, 30d supply, fill #4
  Filled 2022-03-23: qty 30, 30d supply, fill #5
  Filled 2022-04-26: qty 30, 30d supply, fill #6
  Filled 2022-05-25: qty 30, 30d supply, fill #7
  Filled 2022-06-27: qty 30, 30d supply, fill #8
  Filled 2022-07-30: qty 30, 30d supply, fill #9
  Filled 2022-09-03: qty 30, 30d supply, fill #10

## 2021-10-19 MED ORDER — NIFEDIPINE ER OSMOTIC RELEASE 60 MG PO TB24
60.0000 mg | ORAL_TABLET | Freq: Every day | ORAL | 10 refills | Status: DC
  Filled 2021-10-19: qty 30, 30d supply, fill #0
  Filled 2021-11-16: qty 30, 30d supply, fill #1
  Filled 2021-12-16: qty 30, 30d supply, fill #2
  Filled 2022-01-23: qty 30, 30d supply, fill #3
  Filled 2022-02-22: qty 30, 30d supply, fill #4
  Filled 2022-03-23: qty 30, 30d supply, fill #5
  Filled 2022-04-26: qty 30, 30d supply, fill #6
  Filled 2022-05-25: qty 30, 30d supply, fill #7
  Filled 2022-06-27: qty 30, 30d supply, fill #8
  Filled 2022-08-08: qty 30, 30d supply, fill #9
  Filled 2022-09-03: qty 30, 30d supply, fill #10

## 2021-10-20 ENCOUNTER — Other Ambulatory Visit (HOSPITAL_COMMUNITY): Payer: Self-pay

## 2021-10-27 ENCOUNTER — Other Ambulatory Visit (HOSPITAL_COMMUNITY): Payer: Self-pay

## 2021-11-09 ENCOUNTER — Other Ambulatory Visit (HOSPITAL_BASED_OUTPATIENT_CLINIC_OR_DEPARTMENT_OTHER): Payer: Self-pay

## 2021-11-09 ENCOUNTER — Ambulatory Visit: Payer: BC Managed Care – PPO | Admitting: Family Medicine

## 2021-11-09 ENCOUNTER — Encounter: Payer: Self-pay | Admitting: Family Medicine

## 2021-11-09 VITALS — BP 130/72 | HR 67 | Temp 98.2°F | Resp 16 | Ht 73.0 in | Wt 224.2 lb

## 2021-11-09 DIAGNOSIS — E785 Hyperlipidemia, unspecified: Secondary | ICD-10-CM | POA: Diagnosis not present

## 2021-11-09 DIAGNOSIS — N529 Male erectile dysfunction, unspecified: Secondary | ICD-10-CM | POA: Diagnosis not present

## 2021-11-09 DIAGNOSIS — J069 Acute upper respiratory infection, unspecified: Secondary | ICD-10-CM

## 2021-11-09 DIAGNOSIS — I10 Essential (primary) hypertension: Secondary | ICD-10-CM | POA: Diagnosis not present

## 2021-11-09 MED ORDER — SILDENAFIL CITRATE 100 MG PO TABS
ORAL_TABLET | ORAL | 11 refills | Status: DC
  Filled 2021-11-09 – 2021-11-16 (×2): qty 10, 10d supply, fill #0
  Filled 2022-01-23: qty 10, 10d supply, fill #1
  Filled 2022-03-08: qty 10, 10d supply, fill #0
  Filled 2022-03-08: qty 10, 10d supply, fill #2
  Filled 2022-06-12: qty 10, 10d supply, fill #1
  Filled 2022-09-03: qty 10, 10d supply, fill #2

## 2021-11-09 MED ORDER — AMOXICILLIN-POT CLAVULANATE 875-125 MG PO TABS: 1.0000 | ORAL_TABLET | Freq: Two times a day (BID) | ORAL | 0 refills | Status: DC

## 2021-11-09 MED ORDER — FLUTICASONE PROPIONATE 50 MCG/ACT NA SUSP
2.0000 | Freq: Every day | NASAL | 6 refills | Status: DC
  Filled 2021-11-09: qty 16, fill #0

## 2021-11-09 MED ORDER — LEVOCETIRIZINE DIHYDROCHLORIDE 5 MG PO TABS
5.0000 mg | ORAL_TABLET | Freq: Every evening | ORAL | 2 refills | Status: DC
  Filled 2021-11-09: qty 30, 30d supply, fill #0

## 2021-11-09 MED ORDER — FLUTICASONE PROPIONATE 50 MCG/ACT NA SUSP
2.0000 | Freq: Every day | NASAL | 6 refills | Status: DC
  Filled 2021-11-09: qty 16, 30d supply, fill #0

## 2021-11-09 MED ORDER — POTASSIUM CHLORIDE CRYS ER 20 MEQ PO TBCR
60.0000 meq | EXTENDED_RELEASE_TABLET | Freq: Every day | ORAL | 6 refills | Status: DC
  Filled 2021-11-09: qty 30, 10d supply, fill #0

## 2021-11-09 NOTE — Progress Notes (Signed)
Established Patient Office Visit  Subjective:  Patient ID: Bobby Regala., male    DOB: [birth year]  Age: 55 y.o. MRN: [id number]  CC:  Chief Complaint  Patient presents with   Hypertension    Here for follow up    HPI Bobby Neer. presents for f/u  bp and cholesterol.  Bobby Castaneda also c/o stuffy nose x 4 days .  No fever, no chills, no sinus pressure.     Past Medical History:  Diagnosis Date   Allergy    Bell's palsy    History   Hyperlipidemia    Hypertension    Sleep apnea    uses CPAP nightly    Past Surgical History:  Procedure Laterality Date   ADENOIDECTOMY  08/2011   Simeon Craft--- [facility]so ent   CLOSED REDUCTION HAND FRACTURE  12/2010   Right hand   COLONOSCOPY     KNEE SURGERY     bilat scops   POLYPECTOMY      Family History  Problem Relation Age of Onset   Hypertension Mother    Cancer Mother 2       breast   Breast cancer Mother    Hypertension Father    Hyperlipidemia Father    Stroke Father    Cancer Father        prostate   Colon cancer Father        dx'd in his 70's   Hypertension Sister    Brain cancer Sister    Cancer Maternal Aunt 37       breast   Breast cancer Maternal Aunt    Heart disease Maternal Uncle        mi   Hypertension Maternal Uncle    Hypertension Maternal Grandmother    Alzheimer's disease Maternal Grandmother    Heart disease Maternal Grandfather        mi   Hypertension Maternal Grandfather    Hyperlipidemia Maternal Grandfather    Stroke Maternal Grandfather    Stroke Maternal Uncle    Hypertension Maternal Uncle    Hypertension Other    Colon polyps Neg Hx    Rectal cancer Neg Hx    Stomach cancer Neg Hx    Esophageal cancer Neg Hx     Social History   Socioeconomic History   Marital status: Married    Spouse name: Not on file   Number of children: Y   Years of education: Not on file   Highest education level: Not on file  Occupational History   Occupation: combine Optician, dispensing: Higher education careers adviser    Occupation: Control and instrumentation engineer  Tobacco Use   Smoking status: Never   Smokeless tobacco: Never  Vaping Use   Vaping Use: Never used  Substance and Sexual Activity   Alcohol use: No    Alcohol/week: 0.0 standard drinks   Drug use: No   Sexual activity: Yes    Partners: Male  Other Topics Concern   Not on file  Social History Narrative   Regular Exercise- qd    Social Determinants of Health   Financial Resource Strain: Not on file  Food Insecurity: Not on file  Transportation Needs: Not on file  Physical Activity: Not on file  Stress: Not on file  Social Connections: Not on file  Intimate Partner Violence: Not on file    Outpatient Medications Prior to Visit  Medication Sig Dispense Refill   atorvastatin (LIPITOR) 40 MG tablet Take 1 tablet (40 mg total)  by mouth at bedtime. 90 tablet 1   COVID-19 mRNA bivalent vaccine, Pfizer, (PFIZER COVID-19 VAC BIVALENT) injection Inject into the muscle. 0.3 mL 0   doxazosin (CARDURA) 1 MG tablet Take 1 tablet (1 mg total) by mouth daily. 90 tablet 1   hydrochlorothiazide (HYDRODIURIL) 25 MG tablet Take 1 tablet (25 mg total) by mouth daily. 30 tablet 10   meloxicam (MOBIC) 15 MG tablet Take 1 tablet (15 mg total) by mouth daily. 30 tablet 0   methocarbamol (ROBAXIN) 500 MG tablet Take 1 tablet (500 mg total) by mouth 4 (four) times daily. 45 tablet 1   metoprolol succinate (TOPROL-XL) 25 MG 24 hr tablet TAKE 1 TABLET (25 MG TOTAL) BY MOUTH DAILY. 90 tablet 1   NIFEdipine (PROCARDIA XL/NIFEDICAL XL) 60 MG 24 hr tablet Take 1 tablet (60 mg total) by mouth daily. 30 tablet 10   potassium chloride SA (KLOR-CON M) 20 MEQ tablet Take 3 tablets (60 mEq total) by mouth daily. 30 tablet 6   sildenafil (VIAGRA) 100 MG tablet TAKE 0.5-1 TABLETS (50-100 MG TOTAL) BY MOUTH DAILY AS NEEDED FOR ERECTILE DYSFUNCTION. 10 tablet 11   Facility-Administered Medications Prior to Visit  Medication Dose Route Frequency Provider Last Rate Last Admin    0.9 %  sodium chloride infusion  500 mL Intravenous Continuous Irene Shipper, MD       0.9 %  sodium chloride infusion  500 mL Intravenous Once Irene Shipper, MD        Allergies  Allergen Reactions   Ace Inhibitors Other (See Comments)    angioedema    ROS Review of Systems  Constitutional:  Negative for chills and fever.  HENT:  Positive for congestion, postnasal drip, rhinorrhea and sinus pressure.   Respiratory:  Positive for cough. Negative for chest tightness, shortness of breath and wheezing.   Cardiovascular:  Negative for chest pain, palpitations and leg swelling.  Allergic/Immunologic: Negative for environmental allergies.     Objective:    Physical Exam Vitals and nursing note reviewed.  Constitutional:      Appearance: Bobby Castaneda is well-developed.  HENT:     Head: Normocephalic and atraumatic.  Eyes:     Pupils: Pupils are equal, round, and reactive to light.  Neck:     Thyroid: No thyromegaly.  Cardiovascular:     Rate and Rhythm: Normal rate and regular rhythm.     Heart sounds: No murmur heard. Pulmonary:     Effort: Pulmonary effort is normal. No respiratory distress.     Breath sounds: Normal breath sounds. No wheezing or rales.  Chest:     Chest wall: No tenderness.  Musculoskeletal:        General: No tenderness.     Cervical back: Normal range of motion and neck supple.  Skin:    General: Skin is warm and dry.  Neurological:     Mental Status: Bobby Castaneda is alert and oriented to person, place, and time.  Psychiatric:        Behavior: Behavior normal.        Thought Content: Thought content normal.        Judgment: Judgment normal.    BP 130/72 (BP Location: Right Arm, Patient Position: Sitting, Cuff Size: Small)    Pulse 67    Temp 98.2 F (36.8 C) (Oral)    Resp 16    Ht 6\' 1"  (1.854 m)    Wt 224 lb 3.2 oz (101.7 kg)    SpO2 99%  BMI 29.58 kg/m  Wt Readings from Last 3 Encounters:  11/09/21 224 lb 3.2 oz (101.7 kg)  09/14/21 232 lb (105.2 kg)   05/08/21 227 lb 12.8 oz (103.3 kg)     Health Maintenance Due  Topic Date Due   INFLUENZA VACCINE  05/08/2021    There are no preventive care reminders to display for this patient.  Lab Results  Component Value Date   TSH 1.59 05/08/2021   Lab Results  Component Value Date   WBC 4.4 05/08/2021   HGB 12.6 (L) 05/08/2021   HCT 37.4 (L) 05/08/2021   MCV 87.8 05/08/2021   PLT 205.0 05/08/2021   Lab Results  Component Value Date   NA 141 05/08/2021   K 3.4 (L) 05/08/2021   CO2 31 05/08/2021   GLUCOSE 86 05/08/2021   BUN 21 05/08/2021   CREATININE 1.51 (H) 05/08/2021   BILITOT 0.5 05/08/2021   ALKPHOS 72 05/08/2021   AST 20 05/08/2021   ALT 15 05/08/2021   PROT 7.7 05/08/2021   ALBUMIN 4.2 05/08/2021   CALCIUM 9.3 05/08/2021   ANIONGAP 13 09/04/2014   GFR 51.34 (L) 05/08/2021   Lab Results  Component Value Date   CHOL 142 05/08/2021   Lab Results  Component Value Date   HDL 41.70 05/08/2021   Lab Results  Component Value Date   LDLCALC 73 05/08/2021   Lab Results  Component Value Date   TRIG 137.0 05/08/2021   Lab Results  Component Value Date   CHOLHDL 3 05/08/2021   No results found for: HGBA1C    Assessment & Plan:   Problem List Items Addressed This Visit       Unprioritized   Erectile dysfunction   Relevant Medications   sildenafil (VIAGRA) 100 MG tablet   Essential hypertension    Well controlled, no changes to meds. Encouraged heart healthy diet such as the DASH diet and exercise as tolerated.       Relevant Medications   sildenafil (VIAGRA) 100 MG tablet   Hyperlipidemia    Encourage heart healthy diet such as MIND or DASH diet, increase exercise, avoid trans fats, simple carbohydrates and processed foods, consider a krill or fish or flaxseed oil cap daily.       Relevant Medications   sildenafil (VIAGRA) 100 MG tablet   Other Relevant Orders   Lipid panel   Comprehensive metabolic panel   Other Visit Diagnoses     Primary  hypertension    -  Primary   Relevant Medications   sildenafil (VIAGRA) 100 MG tablet   potassium chloride SA (KLOR-CON M) 20 MEQ tablet   Other Relevant Orders   Lipid panel   Comprehensive metabolic panel   Viral upper respiratory tract infection       Relevant Medications   amoxicillin-clavulanate (AUGMENTIN) 875-125 MG tablet   fluticasone (FLONASE) 50 MCG/ACT nasal spray   levocetirizine (XYZAL) 5 MG tablet       Meds ordered this encounter  Medications   DISCONTD: fluticasone (FLONASE) 50 MCG/ACT nasal spray    Sig: Place 2 sprays into both nostrils daily.    Dispense:  16 g    Refill:  6   DISCONTD: levocetirizine (XYZAL) 5 MG tablet    Sig: Take 1 tablet (5 mg total) by mouth every evening.    Dispense:  30 tablet    Refill:  2   amoxicillin-clavulanate (AUGMENTIN) 875-125 MG tablet    Sig: Take 1 tablet by mouth 2 (two) times  daily.    Dispense:  20 tablet    Refill:  0   fluticasone (FLONASE) 50 MCG/ACT nasal spray    Sig: Place 2 sprays into both nostrils daily.    Dispense:  16 g    Refill:  6   levocetirizine (XYZAL) 5 MG tablet    Sig: Take 1 tablet (5 mg total) by mouth every evening.    Dispense:  30 tablet    Refill:  2   sildenafil (VIAGRA) 100 MG tablet    Sig: TAKE 0.5-1 TABLETS (50-100 MG TOTAL) BY MOUTH DAILY AS NEEDED FOR ERECTILE DYSFUNCTION.    Dispense:  10 tablet    Refill:  11   potassium chloride SA (KLOR-CON M) 20 MEQ tablet    Sig: Take 3 tablets (60 mEq total) by mouth daily.    Dispense:  30 tablet    Refill:  6    Follow-up: Return in about 6 months (around 05/09/2022), or if symptoms worsen or fail to improve, for annual exam, fasting.    Ann Held, DO

## 2021-11-09 NOTE — Assessment & Plan Note (Signed)
Encourage heart healthy diet such as MIND or DASH diet, increase exercise, avoid trans fats, simple carbohydrates and processed foods, consider a krill or fish or flaxseed oil cap daily.  °

## 2021-11-09 NOTE — Assessment & Plan Note (Signed)
Well controlled, no changes to meds. Encouraged heart healthy diet such as the DASH diet and exercise as tolerated.  °

## 2021-11-09 NOTE — Progress Notes (Signed)
Established Patient Office Visit  Subjective:  Patient ID: Bobby Castaneda., male    DOB: [birth year]  Age: 56 y.o. MRN: [id number]  CC:  Chief Complaint  Patient presents with   Hypertension    Here for follow up    HPI [name] presents for f/u bp and cholesterol.  He also c/o congestion and cough, productive , no fevers  Past Medical History:  Diagnosis Date   Allergy    Bell's palsy    History   Hyperlipidemia    Hypertension    Sleep apnea    uses CPAP nightly    Past Surgical History:  Procedure Laterality Date   ADENOIDECTOMY  08/2011   Simeon Craft--- gso ent   CLOSED REDUCTION HAND FRACTURE  12/2010   Right hand   COLONOSCOPY     KNEE SURGERY     bilat scops   POLYPECTOMY      Family History  Problem Relation Age of Onset   Hypertension Mother    Cancer Mother 52       breast   Breast cancer Mother    Hypertension Father    Hyperlipidemia Father    Stroke Father    Cancer Father        prostate   Colon cancer Father        dx'd in his 77's   Hypertension Sister    Brain cancer Sister    Cancer Maternal Aunt 37       breast   Breast cancer Maternal Aunt    Heart disease Maternal Uncle        mi   Hypertension Maternal Uncle    Hypertension Maternal Grandmother    Alzheimer's disease Maternal Grandmother    Heart disease Maternal Grandfather        mi   Hypertension Maternal Grandfather    Hyperlipidemia Maternal Grandfather    Stroke Maternal Grandfather    Stroke Maternal Uncle    Hypertension Maternal Uncle    Hypertension Other    Colon polyps Neg Hx    Rectal cancer Neg Hx    Stomach cancer Neg Hx    Esophageal cancer Neg Hx     Social History   Socioeconomic History   Marital status: Married    Spouse name: Not on file   Number of children: Y   Years of education: Not on file   Highest education level: Not on file  Occupational History   Occupation: combine Optician, dispensing: Engineer, building services    Occupation:  Control and instrumentation engineer  Tobacco Use   Smoking status: Never   Smokeless tobacco: Never  Vaping Use   Vaping Use: Never used  Substance and Sexual Activity   Alcohol use: No    Alcohol/week: 0.0 standard drinks   Drug use: No   Sexual activity: Yes    Partners: Male  Other Topics Concern   Not on file  Social History Narrative   Regular Exercise- qd    Social Determinants of Health   Financial Resource Strain: Not on file  Food Insecurity: Not on file  Transportation Needs: Not on file  Physical Activity: Not on file  Stress: Not on file  Social Connections: Not on file  Intimate Partner Violence: Not on file    Outpatient Medications Prior to Visit  Medication Sig Dispense Refill   atorvastatin (LIPITOR) 40 MG tablet Take 1 tablet (40 mg total) by mouth at bedtime. 90 tablet 1   COVID-19 mRNA  bivalent vaccine, Pfizer, (PFIZER COVID-19 VAC BIVALENT) injection Inject into the muscle. 0.3 mL 0   doxazosin (CARDURA) 1 MG tablet Take 1 tablet (1 mg total) by mouth daily. 90 tablet 1   hydrochlorothiazide (HYDRODIURIL) 25 MG tablet Take 1 tablet (25 mg total) by mouth daily. 30 tablet 10   meloxicam (MOBIC) 15 MG tablet Take 1 tablet (15 mg total) by mouth daily. 30 tablet 0   methocarbamol (ROBAXIN) 500 MG tablet Take 1 tablet (500 mg total) by mouth 4 (four) times daily. 45 tablet 1   metoprolol succinate (TOPROL-XL) 25 MG 24 hr tablet TAKE 1 TABLET (25 MG TOTAL) BY MOUTH DAILY. 90 tablet 1   NIFEdipine (PROCARDIA XL/NIFEDICAL XL) 60 MG 24 hr tablet Take 1 tablet (60 mg total) by mouth daily. 30 tablet 10   potassium chloride SA (KLOR-CON M) 20 MEQ tablet Take 3 tablets (60 mEq total) by mouth daily. 30 tablet 6   sildenafil (VIAGRA) 100 MG tablet TAKE 0.5-1 TABLETS (50-100 MG TOTAL) BY MOUTH DAILY AS NEEDED FOR ERECTILE DYSFUNCTION. 10 tablet 11   Facility-Administered Medications Prior to Visit  Medication Dose Route Frequency Provider Last Rate Last Admin   0.9 %  sodium chloride  infusion  500 mL Intravenous Continuous Irene Shipper, MD       0.9 %  sodium chloride infusion  500 mL Intravenous Once Irene Shipper, MD        Allergies  Allergen Reactions   Ace Inhibitors Other (See Comments)    angioedema    ROS Review of Systems  Constitutional:  Negative for chills and fever.  HENT:  Positive for congestion, postnasal drip, rhinorrhea and sneezing. Negative for ear discharge, ear pain, sinus pressure, sinus pain and sore throat.   Respiratory:  Positive for cough.      Objective:    Physical Exam Vitals and nursing note reviewed.  Constitutional:      Appearance: He is well-developed.  HENT:     Head: Normocephalic and atraumatic.     Nose: Congestion and rhinorrhea present.  Eyes:     Pupils: Pupils are equal, round, and reactive to light.  Neck:     Thyroid: No thyromegaly.  Cardiovascular:     Rate and Rhythm: Normal rate and regular rhythm.     Heart sounds: No murmur heard. Pulmonary:     Effort: Pulmonary effort is normal. No respiratory distress.     Breath sounds: Normal breath sounds. No wheezing or rales.  Chest:     Chest wall: No tenderness.  Musculoskeletal:        General: No tenderness.     Cervical back: Normal range of motion and neck supple.  Skin:    General: Skin is warm and dry.  Neurological:     Mental Status: He is alert and oriented to person, place, and time.  Psychiatric:        Mood and Affect: Mood normal.        Behavior: Behavior normal.        Thought Content: Thought content normal.        Judgment: Judgment normal.    BP 130/72 (BP Location: Right Arm, Patient Position: Sitting, Cuff Size: Small)    Pulse 67    Temp 98.2 F (36.8 C) (Oral)    Resp 16    Ht 6\' 1"  (1.854 m)    Wt 224 lb 3.2 oz (101.7 kg)    SpO2 99%    BMI 29.58  kg/m  Wt Readings from Last 3 Encounters:  11/09/21 224 lb 3.2 oz (101.7 kg)  09/14/21 232 lb (105.2 kg)  05/08/21 227 lb 12.8 oz (103.3 kg)     Health Maintenance Due   Topic Date Due   INFLUENZA VACCINE  05/08/2021    There are no preventive care reminders to display for this patient.  Lab Results  Component Value Date   TSH 1.59 05/08/2021   Lab Results  Component Value Date   WBC 4.4 05/08/2021   HGB 12.6 (L) 05/08/2021   HCT 37.4 (L) 05/08/2021   MCV 87.8 05/08/2021   PLT 205.0 05/08/2021   Lab Results  Component Value Date   NA 141 05/08/2021   K 3.4 (L) 05/08/2021   CO2 31 05/08/2021   GLUCOSE 86 05/08/2021   BUN 21 05/08/2021   CREATININE 1.51 (H) 05/08/2021   BILITOT 0.5 05/08/2021   ALKPHOS 72 05/08/2021   AST 20 05/08/2021   ALT 15 05/08/2021   PROT 7.7 05/08/2021   ALBUMIN 4.2 05/08/2021   CALCIUM 9.3 05/08/2021   ANIONGAP 13 09/04/2014   GFR 51.34 (L) 05/08/2021   Lab Results  Component Value Date   CHOL 142 05/08/2021   Lab Results  Component Value Date   HDL 41.70 05/08/2021   Lab Results  Component Value Date   LDLCALC 73 05/08/2021   Lab Results  Component Value Date   TRIG 137.0 05/08/2021   Lab Results  Component Value Date   CHOLHDL 3 05/08/2021   No results found for: HGBA1C    Assessment & Plan:   Problem List Items Addressed This Visit       Unprioritized   Erectile dysfunction   Relevant Medications   sildenafil (VIAGRA) 100 MG tablet   Essential hypertension    Well controlled, no changes to meds. Encouraged heart healthy diet such as the DASH diet and exercise as tolerated.       Relevant Medications   sildenafil (VIAGRA) 100 MG tablet   Hyperlipidemia    Encourage heart healthy diet such as MIND or DASH diet, increase exercise, avoid trans fats, simple carbohydrates and processed foods, consider a krill or fish or flaxseed oil cap daily.       Relevant Medications   sildenafil (VIAGRA) 100 MG tablet   Other Relevant Orders   Lipid panel   Comprehensive metabolic panel   Other Visit Diagnoses     Primary hypertension    -  Primary   Relevant Medications   sildenafil  (VIAGRA) 100 MG tablet   potassium chloride SA (KLOR-CON M) 20 MEQ tablet   Other Relevant Orders   Lipid panel   Comprehensive metabolic panel   Viral upper respiratory tract infection       Relevant Medications   amoxicillin-clavulanate (AUGMENTIN) 875-125 MG tablet   fluticasone (FLONASE) 50 MCG/ACT nasal spray   levocetirizine (XYZAL) 5 MG tablet       Meds ordered this encounter  Medications   DISCONTD: fluticasone (FLONASE) 50 MCG/ACT nasal spray    Sig: Place 2 sprays into both nostrils daily.    Dispense:  16 g    Refill:  6   DISCONTD: levocetirizine (XYZAL) 5 MG tablet    Sig: Take 1 tablet (5 mg total) by mouth every evening.    Dispense:  30 tablet    Refill:  2   amoxicillin-clavulanate (AUGMENTIN) 875-125 MG tablet    Sig: Take 1 tablet by mouth 2 (two) times daily.  Dispense:  20 tablet    Refill:  0   fluticasone (FLONASE) 50 MCG/ACT nasal spray    Sig: Place 2 sprays into both nostrils daily.    Dispense:  16 g    Refill:  6   levocetirizine (XYZAL) 5 MG tablet    Sig: Take 1 tablet (5 mg total) by mouth every evening.    Dispense:  30 tablet    Refill:  2   sildenafil (VIAGRA) 100 MG tablet    Sig: TAKE 0.5-1 TABLETS (50-100 MG TOTAL) BY MOUTH DAILY AS NEEDED FOR ERECTILE DYSFUNCTION.    Dispense:  10 tablet    Refill:  11   potassium chloride SA (KLOR-CON M) 20 MEQ tablet    Sig: Take 3 tablets (60 mEq total) by mouth daily.    Dispense:  30 tablet    Refill:  6    Follow-up: Return in about 6 months (around 05/09/2022), or if symptoms worsen or fail to improve, for annual exam, fasting.    Ann Held, DO

## 2021-11-09 NOTE — Patient Instructions (Signed)
Cholesterol Content in Foods ?Cholesterol is a waxy, fat-like substance that helps to carry fat in the blood. The body needs cholesterol in small amounts, but too much cholesterol can cause damage to the arteries and heart. ?What foods have cholesterol? ?Cholesterol is found in animal-based foods, such as meat, seafood, and dairy. Generally, low-fat dairy and lean meats have less cholesterol than full-fat dairy and fatty meats. The milligrams of cholesterol per serving (mg per serving) of common cholesterol-containing foods are listed below. ?Meats and other proteins ?Egg -- one large whole egg has 186 mg. ?Veal shank -- 4 oz (113 g) has 141 mg. ?Lean ground turkey (93% lean) -- 4 oz (113 g) has 118 mg. ?Fat-trimmed lamb loin -- 4 oz (113 g) has 106 mg. ?Lean ground beef (90% lean) -- 4 oz (113 g) has 100 mg. ?Lobster -- 3.5 oz (99 g) has 90 mg. ?Pork loin chops -- 4 oz (113 g) has 86 mg. ?Canned salmon -- 3.5 oz (99 g) has 83 mg. ?Fat-trimmed beef top loin -- 4 oz (113 g) has 78 mg. ?Frankfurter -- 1 frank (3.5 oz or 99 g) has 77 mg. ?Crab -- 3.5 oz (99 g) has 71 mg. ?Roasted chicken without skin, white meat -- 4 oz (113 g) has 66 mg. ?Light bologna -- 2 oz (57 g) has 45 mg. ?Deli-cut turkey -- 2 oz (57 g) has 31 mg. ?Canned tuna -- 3.5 oz (99 g) has 31 mg. ?Bacon -- 1 oz (28 g) has 29 mg. ?Oysters and mussels (raw) -- 3.5 oz (99 g) has 25 mg. ?Mackerel -- 1 oz (28 g) has 22 mg. ?Trout -- 1 oz (28 g) has 20 mg. ?Pork sausage -- 1 link (1 oz or 28 g) has 17 mg. ?Salmon -- 1 oz (28 g) has 16 mg. ?Tilapia -- 1 oz (28 g) has 14 mg. ?Dairy ?Soft-serve ice cream -- ? cup (4 oz or 86 g) has 103 mg. ?Whole-milk yogurt -- 1 cup (8 oz or 245 g) has 29 mg. ?Cheddar cheese -- 1 oz (28 g) has 28 mg. ?American cheese -- 1 oz (28 g) has 28 mg. ?Whole milk -- 1 cup (8 oz or 250 mL) has 23 mg. ?2% milk -- 1 cup (8 oz or 250 mL) has 18 mg. ?Cream cheese -- 1 tablespoon (Tbsp) (14.5 g) has 15 mg. ?Cottage cheese -- ? cup (4 oz or 113  g) has 14 mg. ?Low-fat (1%) milk -- 1 cup (8 oz or 250 mL) has 10 mg. ?Sour cream -- 1 Tbsp (12 g) has 8.5 mg. ?Low-fat yogurt -- 1 cup (8 oz or 245 g) has 8 mg. ?Nonfat Greek yogurt -- 1 cup (8 oz or 228 g) has 7 mg. ?Half-and-half cream -- 1 Tbsp (15 mL) has 5 mg. ?Fats and oils ?Cod liver oil -- 1 tablespoon (Tbsp) (13.6 g) has 82 mg. ?Butter -- 1 Tbsp (14 g) has 15 mg. ?Lard -- 1 Tbsp (12.8 g) has 14 mg. ?Bacon grease -- 1 Tbsp (12.9 g) has 14 mg. ?Mayonnaise -- 1 Tbsp (13.8 g) has 5-10 mg. ?Margarine -- 1 Tbsp (14 g) has 3-10 mg. ?The items listed above may not be a complete list of foods with cholesterol. Exact amounts of cholesterol in these foods may vary depending on specific ingredients and brands. Contact a dietitian for more information. ?What foods do not have cholesterol? ?Most plant-based foods do not have cholesterol unless you combine them with a food that has cholesterol.   Foods without cholesterol include: ?Grains and cereals. ?Vegetables. ?Fruits. ?Vegetable oils, such as olive, canola, and sunflower oil. ?Legumes, such as peas, beans, and lentils. ?Nuts and seeds. ?Egg whites. ?The items listed above may not be a complete list of foods that do not have cholesterol. Contact a dietitian for more information. ?Summary ?The body needs cholesterol in small amounts, but too much cholesterol can cause damage to the arteries and heart. ?Cholesterol is found in animal-based foods, such as meat, seafood, and dairy. Generally, low-fat dairy and lean meats have less cholesterol than full-fat dairy and fatty meats. ?This information is not intended to replace advice given to you by your health care provider. Make sure you discuss any questions you have with your health care provider. ?Document Revised: 02/03/2021 Document Reviewed: 02/03/2021 ?Elsevier Patient Education ? 2022 Else[employer] Inc[employer] ? ?

## 2021-11-10 LAB — COMPREHENSIVE METABOLIC PANEL
ALT: 16 U/L (ref 0–53)
AST: 23 U/L (ref 0–37)
Albumin: 4.3 g/dL (ref 3.5–5.2)
Alkaline Phosphatase: 76 U/L (ref 39–117)
BUN: 20 mg/dL (ref 6–23)
CO2: 37 mEq/L — ABNORMAL HIGH (ref 19–32)
Calcium: 9.3 mg/dL (ref 8.4–10.5)
Chloride: 100 mEq/L (ref 96–112)
Creatinine, Ser: 1.42 mg/dL (ref 0.40–1.50)
GFR: 55.07 mL/min — ABNORMAL LOW (ref >60.00)
Glucose, Bld: 87 mg/dL (ref 70–99)
Potassium: 3.2 mEq/L — ABNORMAL LOW (ref 3.5–5.1)
Sodium: 140 mEq/L (ref 135–145)
Total Bilirubin: 0.6 mg/dL (ref 0.2–1.2)
Total Protein: 8.1 g/dL (ref 6.0–8.3)

## 2021-11-10 LAB — LIPID PANEL
Cholesterol: 123 mg/dL (ref 0–200)
HDL: 34.2 mg/dL — ABNORMAL LOW (ref >39.00)
LDL Cholesterol: 62 mg/dL (ref 0–99)
NonHDL: 89.2
Total CHOL/HDL Ratio: 4
Triglycerides: 137 mg/dL (ref 0.0–149.0)
VLDL: 27.4 mg/dL (ref 0.0–40.0)

## 2021-11-13 ENCOUNTER — Other Ambulatory Visit (HOSPITAL_BASED_OUTPATIENT_CLINIC_OR_DEPARTMENT_OTHER): Payer: Self-pay

## 2021-11-13 ENCOUNTER — Telehealth: Payer: Self-pay | Admitting: Family Medicine

## 2021-11-13 ENCOUNTER — Other Ambulatory Visit: Payer: Self-pay | Admitting: Family Medicine

## 2021-11-13 DIAGNOSIS — I10 Essential (primary) hypertension: Secondary | ICD-10-CM

## 2021-11-13 MED ORDER — POTASSIUM CHLORIDE CRYS ER 20 MEQ PO TBCR
60.0000 meq | EXTENDED_RELEASE_TABLET | Freq: Every day | ORAL | 6 refills | Status: DC
  Filled 2021-11-13 – 2021-11-16 (×2): qty 90, 30d supply, fill #0
  Filled 2021-12-25: qty 90, 30d supply, fill #1
  Filled 2021-12-27: qty 90, 30d supply, fill #0
  Filled 2022-02-04: qty 90, 30d supply, fill #1
  Filled 2022-03-08: qty 90, 30d supply, fill #2
  Filled 2022-04-11: qty 90, 30d supply, fill #3
  Filled 2022-05-15: qty 90, 30d supply, fill #4
  Filled 2022-06-14: qty 90, 30d supply, fill #5
  Filled ????-??-??: fill #0

## 2021-11-13 NOTE — Telephone Encounter (Signed)
opened in error

## 2021-11-14 ENCOUNTER — Other Ambulatory Visit (HOSPITAL_BASED_OUTPATIENT_CLINIC_OR_DEPARTMENT_OTHER): Payer: Self-pay

## 2021-11-16 ENCOUNTER — Other Ambulatory Visit (HOSPITAL_COMMUNITY): Payer: Self-pay

## 2021-11-16 ENCOUNTER — Other Ambulatory Visit (HOSPITAL_BASED_OUTPATIENT_CLINIC_OR_DEPARTMENT_OTHER): Payer: Self-pay

## 2021-11-17 ENCOUNTER — Other Ambulatory Visit (HOSPITAL_COMMUNITY): Payer: Self-pay

## 2021-12-04 ENCOUNTER — Other Ambulatory Visit: Payer: Self-pay | Admitting: Family Medicine

## 2021-12-04 ENCOUNTER — Other Ambulatory Visit (HOSPITAL_COMMUNITY): Payer: Self-pay

## 2021-12-04 MED ORDER — ATORVASTATIN CALCIUM 40 MG PO TABS
40.0000 mg | ORAL_TABLET | Freq: Every day | ORAL | 1 refills | Status: DC
  Filled 2021-12-04: qty 90, 90d supply, fill #0
  Filled 2022-03-08: qty 90, 90d supply, fill #1

## 2021-12-18 ENCOUNTER — Other Ambulatory Visit (HOSPITAL_COMMUNITY): Payer: Self-pay

## 2021-12-26 ENCOUNTER — Other Ambulatory Visit (HOSPITAL_BASED_OUTPATIENT_CLINIC_OR_DEPARTMENT_OTHER): Payer: Self-pay

## 2021-12-27 ENCOUNTER — Other Ambulatory Visit (HOSPITAL_COMMUNITY): Payer: Self-pay

## 2021-12-27 ENCOUNTER — Other Ambulatory Visit (HOSPITAL_BASED_OUTPATIENT_CLINIC_OR_DEPARTMENT_OTHER): Payer: Self-pay

## 2022-01-16 ENCOUNTER — Ambulatory Visit (INDEPENDENT_AMBULATORY_CARE_PROVIDER_SITE_OTHER): Payer: BC Managed Care – PPO | Admitting: Family Medicine

## 2022-01-16 ENCOUNTER — Other Ambulatory Visit (HOSPITAL_BASED_OUTPATIENT_CLINIC_OR_DEPARTMENT_OTHER): Payer: Self-pay

## 2022-01-16 VITALS — BP 140/74 | HR 71 | Temp 98.7°F | Resp 16 | Ht 73.0 in | Wt 229.0 lb

## 2022-01-16 DIAGNOSIS — L03317 Cellulitis of buttock: Secondary | ICD-10-CM

## 2022-01-16 DIAGNOSIS — L0231 Cutaneous abscess of buttock: Secondary | ICD-10-CM

## 2022-01-16 MED ORDER — DOXYCYCLINE HYCLATE 100 MG PO TABS
100.0000 mg | ORAL_TABLET | Freq: Two times a day (BID) | ORAL | 0 refills | Status: DC
  Filled 2022-01-16: qty 20, 10d supply, fill #0

## 2022-01-16 MED ORDER — CEFTRIAXONE SODIUM 1 G IJ SOLR
1.0000 g | Freq: Once | INTRAMUSCULAR | Status: AC
  Administered 2022-01-16: 1 g via INTRAMUSCULAR

## 2022-01-16 NOTE — Progress Notes (Signed)
? ?New Patient Office Visit ? ?Subjective:  ?Patient ID: Bobby Castaneda., male    DOB: July 19, 1966  Age: 56 y.o. MRN: [id number] ? ?CC:  ?Chief Complaint  ?Patient presents with  ? butt cheek Swollen  ?  Here for swollen left butt cheek onset 3 days  ? ? ?HPI ?Bobby Castaneda. presents for pain and hard area Left buttocks---  no known injury, pt does not know of any insect bite etc.   No fevers   area is painful ? ?Past Medical History:  ?Diagnosis Date  ? Allergy   ? Bell's palsy   ? History  ? Hyperlipidemia   ? Hypertension   ? Sleep apnea   ? uses CPAP nightly  ? ? ?Past Surgical History:  ?Procedure Laterality Date  ? ADENOIDECTOMY  08/2011  ? Gore--- gso ent  ? CLOSED REDUCTION HAND FRACTURE  12/2010  ? Right hand  ? COLONOSCOPY    ? KNEE SURGERY    ? bilat scops  ? POLYPECTOMY    ? ? ?Family History  ?Problem Relation Age of Onset  ? Hypertension Mother   ? Cancer Mother 34  ?     breast  ? Breast cancer Mother   ? Hypertension Father   ? Hyperlipidemia Father   ? Stroke Father   ? Cancer Father   ?     prostate  ? Colon cancer Father   ?     dx'd in his 41's  ? Hypertension Sister   ? Brain cancer Sister   ? Cancer Maternal Aunt 37  ?     breast  ? Breast cancer Maternal Aunt   ? Heart disease Maternal Uncle   ?     mi  ? Hypertension Maternal Uncle   ? Hypertension Maternal Grandmother   ? Alzheimer's disease Maternal Grandmother   ? Heart disease Maternal Grandfather   ?     mi  ? Hypertension Maternal Grandfather   ? Hyperlipidemia Maternal Grandfather   ? Stroke Maternal Grandfather   ? Stroke Maternal Uncle   ? Hypertension Maternal Uncle   ? Hypertension Other   ? Colon polyps Neg Hx   ? Rectal cancer Neg Hx   ? Stomach cancer Neg Hx   ? Esophageal cancer Neg Hx   ? ? ?Social History  ? ?Socioeconomic History  ? Marital status: Married  ?  Spouse name: Not on file  ? Number of children: Y  ? Years of education: Not on file  ? Highest education level: Not on file  ?Occupational History  ? 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  Employer: [employer]   ? Occupation: Control and instrumentation engineer  ?Tobacco Use  ? Smoking status: Never  ? Smokeless tobacco: Never  ?Vaping Use  ? Vaping Use: Never used  ?Substance and Sexual Activity  ? Alcohol use: No  ?  Alcohol/week: 0.0 standard drinks  ? Drug use: No  ? Sexual activity: Yes  ?  Partners: Male  ?Other Topics Concern  ? Not on file  ?Social History Narrative  ? Regular Exercise- qd   ? ?Social Determinants of Health  ? ?Financial Resource Strain: Not on file  ?Food Insecurity: Not on file  ?Transportation Needs: Not on file  ?Physical Activity: Not on file  ?Stress: Not on file  ?Social Connections: Not on file  ?Intimate Partner Violence: Not on file  ? ? ?ROS ?Review of Systems  ?Constitutional: Negative.   ?HENT:  Negative for congestion,  ear pain, hearing loss, nosebleeds, postnasal drip, rhinorrhea, sinus pressure, sneezing and tinnitus.   ?Eyes:  Negative for photophobia, discharge, itching and visual disturbance.  ?Respiratory: Negative.    ?Cardiovascular: Negative.   ?Gastrointestinal:  Negative for abdominal distention, abdominal pain, anal bleeding, blood in stool and constipation.  ?Endocrine: Negative.   ?Genitourinary: Negative.   ?Musculoskeletal: Negative.   ?Skin:  Positive for wound.  ?Allergic/Immunologic: Negative.   ?Neurological:  Negative for dizziness, weakness, light-headedness, numbness and headaches.  ?Psychiatric/Behavioral:  Negative for agitation, confusion, decreased concentration, dysphoric mood, sleep disturbance and suicidal ideas. The patient is not nervous/anxious.   ? ?Objective:  ? ?Today's Vitals: BP 140/74 (BP Location: Right Arm, Patient Position: Sitting, Cuff Size: Normal)   Pulse 71   Temp 98.7 ?F (37.1 ?C) (Oral)   Resp 16   Ht '6\' 1"'$  (1.854 m)   Wt 229 lb (103.9 kg)   SpO2 95%   BMI 30.21 kg/m?  ? ?Physical Exam ?Vitals and nursing note reviewed.  ?Constitutional:   ?   Appearance: He is well-developed.  ?HENT:  ?   Head:  Normocephalic and atraumatic.  ?Eyes:  ?   Pupils: Pupils are equal, round, and reactive to light.  ?Neck:  ?   Thyroid: No thyromegaly.  ?Cardiovascular:  ?   Rate and Rhythm: Normal rate and regular rhythm.  ?   Heart sounds: No murmur heard. ?Pulmonary:  ?   Effort: Pulmonary effort is normal. No respiratory distress.  ?   Breath sounds: Normal breath sounds. No wheezing or rales.  ?Chest:  ?   Chest wall: No tenderness.  ?Musculoskeletal:  ?   Cervical back: Normal range of motion and neck supple.  ?   Right hip: Tenderness present. Normal range of motion. Normal strength.  ?   Left hip: Tenderness present. Normal range of motion. Normal strength.  ?   Right foot: Bony tenderness present. No swelling.  ?   Left foot: Bony tenderness present. No swelling.  ?Skin: ?   General: Skin is warm and dry.  ?   Findings: Erythema present.  ?   Comments: L buttock---- hard and tender to touch, no drainage   ?Neurological:  ?   Mental Status: He is alert and oriented to person, place, and time.  ?Psychiatric:     ?   Behavior: Behavior normal.     ?   Thought Content: Thought content normal.     ?   Judgment: Judgment normal.  ? ? ?Assessment & Plan:  ? ?Problem List Items Addressed This Visit   ? ?  ? Unprioritized  ? Abscess and cellulitis of gluteal region - Primary  ?  Warm compresses, sitz bath ?Rocephin IM ?Doxycycline 100 mg bid x 10 days  ?Refer to surgery if no improvement  ? ?  ?  ? Relevant Medications  ? doxycycline (VIBRA-TABS) 100 MG tablet  ? ? ?Outpatient Encounter Medications as of 01/16/2022  ?Medication Sig  ? amoxicillin-clavulanate (AUGMENTIN) 875-125 MG tablet Take 1 tablet by mouth 2 (two) times daily.  ? atorvastatin (LIPITOR) 40 MG tablet Take 1 tablet (40 mg total) by mouth at bedtime.  ? COVID-19 mRNA bivalent vaccine, Pfizer, (PFIZER COVID-19 VAC BIVALENT) injection Inject into the muscle.  ? doxazosin (CARDURA) 1 MG tablet Take 1 tablet (1 mg total) by mouth daily.  ? doxycycline (VIBRA-TABS) 100  MG tablet Take 1 tablet (100 mg total) by mouth 2 (two) times daily.  ? fluticasone (FLONASE) 50 MCG/ACT  nasal spray Place 2 sprays into both nostrils daily.  ? hydrochlorothiazide (HYDRODIURIL) 25 MG tablet Take 1 tablet (25 mg total) by mouth daily.  ? levocetirizine (XYZAL) 5 MG tablet Take 1 tablet (5 mg total) by mouth every evening.  ? meloxicam (MOBIC) 15 MG tablet Take 1 tablet (15 mg total) by mouth daily.  ? methocarbamol (ROBAXIN) 500 MG tablet Take 1 tablet (500 mg total) by mouth 4 (four) times daily.  ? NIFEdipine (PROCARDIA XL/NIFEDICAL XL) 60 MG 24 hr tablet Take 1 tablet (60 mg total) by mouth daily.  ? potassium chloride SA (KLOR-CON M) 20 MEQ tablet Take 3 tablets (60 mEq total) by mouth daily.  ? sildenafil (VIAGRA) 100 MG tablet TAKE 0.5-1 TABLETS (50-100 MG TOTAL) BY MOUTH DAILY AS NEEDED FOR ERECTILE DYSFUNCTION.  ? metoprolol succinate (TOPROL-XL) 25 MG 24 hr tablet TAKE 1 TABLET (25 MG TOTAL) BY MOUTH DAILY.  ? ?Facility-Administered Encounter Medications as of 01/16/2022  ?Medication  ? 0.9 %  sodium chloride infusion  ? 0.9 %  sodium chloride infusion  ? [COMPLETED] cefTRIAXone (ROCEPHIN) injection 1 g  ? ? ?Follow-up: Return if symptoms worsen or fail to improve.  ? ?Ann Held, DO ? ?

## 2022-01-16 NOTE — Patient Instructions (Signed)
Skin Abscess  A skin abscess is an infected area on or under your skin that contains a collection of pus and other material. An abscess may also be called a furuncle,carbuncle, or boil. An abscess can occur in or on almost any part of your body. Some abscesses break open (rupture) on their own. Most continue to get worse unless they are treated. The infection can spread deeper into the body and eventually into your blood, whichcan make you feel ill. Treatment usually involves draining the abscess. What are the causes? An abscess occurs when germs, like bacteria, pass through your skin and cause an infection. This may be caused by: A scrape or cut on your skin. A puncture wound through your skin, including a needle injection or insect bite. Blocked oil or sweat glands. Blocked and infected hair follicles. A cyst that forms beneath your skin (sebaceous cyst) and becomes infected. What increases the risk? This condition is more likely to develop in people who: Have a weak body defense system (immune system). Have diabetes. Have dry and irritated skin. Get frequent injections or use illegal IV drugs. Have a foreign body in a wound, such as a splinter. Have problems with their lymph system or veins. What are the signs or symptoms? Symptoms of this condition include: A painful, firm bump under the skin. A bump with pus at the top. This may break through the skin and drain. Other symptoms include: Redness surrounding the abscess site. Warmth. Swelling of the lymph nodes (glands) near the abscess. Tenderness. A sore on the skin. How is this diagnosed? This condition may be diagnosed based on: A physical exam. Your medical history. A sample of pus. This may be used to find out what is causing the infection. Blood tests. Imaging tests, such as an ultrasound, CT scan, or MRI. How is this treated? A small abscess that drains on its own may not need treatment. Treatment for larger abscesses  may include: Moist heat or heat pack applied to the area several times a day. A procedure to drain the abscess (incision and drainage). Antibiotic medicines. For a severe abscess, you may first get antibiotics through an IV and then change to antibiotics by mouth. Follow these instructions at home: Medicines  Take over-the-counter and prescription medicines only as told by your health care provider. If you were prescribed an antibiotic medicine, take it as told by your health care provider. Do not stop taking the antibiotic even if you start to feel better.  Abscess care  If you have an abscess that has not drained, apply heat to the affected area. Use the heat source that your health care provider recommends, such as a moist heat pack or a heating pad. Place a towel between your skin and the heat source. Leave the heat on for 20-30 minutes. Remove the heat if your skin turns bright red. This is especially important if you are unable to feel pain, heat, or cold. You may have a greater risk of getting burned. Follow instructions from your health care provider about how to take care of your abscess. Make sure you: Cover the abscess with a bandage (dressing). Change your dressing or gauze as told by your health care provider. Wash your hands with soap and water before you change the dressing or gauze. If soap and water are not available, use hand sanitizer. Check your abscess every day for signs of a worsening infection. Check for: More redness, swelling, or pain. More fluid or blood. Warmth. More   pus or a bad smell.  General instructions To avoid spreading the infection: Do not share personal care items, towels, or hot tubs with others. Avoid making skin contact with other people. Keep all follow-up visits as told by your health care provider. This is important. Contact a health care provider if you have: More redness, swelling, or pain around your abscess. More fluid or blood coming  from your abscess. Warm skin around your abscess. More pus or a bad smell coming from your abscess. A fever. Muscle aches. Chills or a general ill feeling. Get help right away if you: Have severe pain. See red streaks on your skin spreading away from the abscess. Summary A skin abscess is an infected area on or under your skin that contains a collection of pus and other material. A small abscess that drains on its own may not need treatment. Treatment for larger abscesses may include having a procedure to drain the abscess and taking an antibiotic. This information is not intended to replace advice given to you by your health care provider. Make sure you discuss any questions you have with your healthcare provider. Document Revised: 01/15/2019 Document Reviewed: 11/07/2017 Elsevier Patient Education  2022 [employer]  

## 2022-01-16 NOTE — Assessment & Plan Note (Signed)
Warm compresses, sitz bath ?Rocephin IM ?Doxycycline 100 mg bid x 10 days  ?Refer to surgery if no improvement  ?

## 2022-01-18 ENCOUNTER — Ambulatory Visit: Payer: BC Managed Care – PPO | Admitting: Family Medicine

## 2022-01-18 ENCOUNTER — Telehealth: Payer: Self-pay

## 2022-01-18 DIAGNOSIS — L0231 Cutaneous abscess of buttock: Secondary | ICD-10-CM

## 2022-01-18 NOTE — Telephone Encounter (Signed)
Spoke with patient. Pt states the swelling of the abscess has gone down but is having pain with sitting. Pt states he has not tried anything OTC for pain yet and has been using warm compresses. Pleases advise ?

## 2022-01-18 NOTE — Telephone Encounter (Signed)
[facility] Client ?Client Site [facility] ?Provider Roma Schanz- MD ?Contact Type Call ?Who Is Calling Patient / Member / Family / Caregiver ?Caller Name Dorthy Hustead ?Caller Phone Number [phone] ?Patient Name Bobby Castaneda ?Patient DOB 12/21/1965 ?Call Type Message Only Information Provided ?Reason for Call Request for General Office Information ?Initial Comment Caller states he would like a call from the office ?Additional Comment Provided office hours ?

## 2022-01-19 NOTE — Telephone Encounter (Signed)
Pt called. LVM and placed referral for general surgery  ?

## 2022-01-19 NOTE — Addendum Note (Signed)
Addended by: Sanda Linger on: 01/19/2022 02:46 PM ? ? Modules accepted: Orders ? ?

## 2022-01-19 NOTE — Telephone Encounter (Signed)
Pt advised of general surgery. He stated he is doing a little better and wanted to talk with Nira Conn and see if he still needs referral. Please advise.  ?

## 2022-01-21 ENCOUNTER — Encounter: Payer: Self-pay | Admitting: Family Medicine

## 2022-01-22 NOTE — Telephone Encounter (Signed)
Spoke with patient. Pt states feeling a lot better. GS has not called patient yet but I did advise to have consultation with GS to make sure the abscess won't come back.  ?

## 2022-01-23 ENCOUNTER — Other Ambulatory Visit (HOSPITAL_COMMUNITY): Payer: Self-pay

## 2022-02-05 ENCOUNTER — Other Ambulatory Visit (HOSPITAL_COMMUNITY): Payer: Self-pay

## 2022-02-22 ENCOUNTER — Other Ambulatory Visit (HOSPITAL_COMMUNITY): Payer: Self-pay

## 2022-03-08 ENCOUNTER — Other Ambulatory Visit (HOSPITAL_BASED_OUTPATIENT_CLINIC_OR_DEPARTMENT_OTHER): Payer: Self-pay

## 2022-03-08 ENCOUNTER — Other Ambulatory Visit (HOSPITAL_COMMUNITY): Payer: Self-pay

## 2022-03-09 ENCOUNTER — Other Ambulatory Visit (HOSPITAL_BASED_OUTPATIENT_CLINIC_OR_DEPARTMENT_OTHER): Payer: Self-pay

## 2022-03-23 ENCOUNTER — Other Ambulatory Visit (HOSPITAL_COMMUNITY): Payer: Self-pay

## 2022-04-11 ENCOUNTER — Other Ambulatory Visit (HOSPITAL_COMMUNITY): Payer: Self-pay

## 2022-04-27 ENCOUNTER — Other Ambulatory Visit (HOSPITAL_COMMUNITY): Payer: Self-pay

## 2022-05-06 ENCOUNTER — Ambulatory Visit (INDEPENDENT_AMBULATORY_CARE_PROVIDER_SITE_OTHER): Payer: BC Managed Care – PPO

## 2022-05-06 ENCOUNTER — Ambulatory Visit (HOSPITAL_COMMUNITY)
Admission: EM | Admit: 2022-05-06 | Discharge: 2022-05-06 | Disposition: A | Discharge status: Home / Self Care | Payer: BC Managed Care – PPO | Attending: Emergency Medicine | Admitting: Emergency Medicine

## 2022-05-06 ENCOUNTER — Other Ambulatory Visit: Payer: Self-pay

## 2022-05-06 ENCOUNTER — Encounter (HOSPITAL_COMMUNITY): Payer: Self-pay | Admitting: Emergency Medicine

## 2022-05-06 DIAGNOSIS — M79661 Pain in right lower leg: Secondary | ICD-10-CM | POA: Diagnosis not present

## 2022-05-06 DIAGNOSIS — M84361A Stress fracture, right tibia, initial encounter for fracture: Secondary | ICD-10-CM | POA: Diagnosis not present

## 2022-05-06 MED ORDER — ACETAMINOPHEN 325 MG PO TABS
650.0000 mg | ORAL_TABLET | Freq: Once | ORAL | Status: AC
  Administered 2022-05-06: 650 mg via ORAL

## 2022-05-06 MED ORDER — ACETAMINOPHEN 325 MG PO TABS
ORAL_TABLET | ORAL | Status: AC
  Filled 2022-05-06: qty 2

## 2022-05-06 NOTE — ED Triage Notes (Signed)
Right lower leg pain and swelling.  04/24/2022 patient anad another player was playing basketball, the connected shin of legs.  Patient has had pain since and now right ankle is swelling and has soreness.

## 2022-05-06 NOTE — Discharge Instructions (Signed)
You will need to use the crutches and be nonweightbearing on the right leg.  It is important to keep off the leg as you are at great risk for fracture.  Please call the sports medicine clinic; they will see you next week for a follow-up visit.  Tylenol for pain, ice and elevate the leg.

## 2022-05-06 NOTE — ED Provider Notes (Signed)
[facility]    CSN: [id number] Arrival date & time: 05/06/22  1356     History   Chief Complaint Chief Complaint  Patient presents with   Leg Pain    HPI Bobby Castaneda. is a 56 y.o. male.  Presents with right leg pain. Was playing basketball about 2 weeks ago when he banged his shin on another person's.  He has had pain in the shin since then, reports it has been worsening recently.  The right ankle was starting to swell today. He reports pain is mostly located on the shin, a little tender in the ankle from the swelling.  He has been able to walk and bear weight since injury occurred.  Denies numbness, tingling, or loss of sensation in the foot.  He has been icing and elevating the extremity. Tried a couple ibuprofen and then switched to Tylenol. History of kidney disease.  Has not taken meds today.  No pain in the knee or foot.  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   Bell's palsy 05/19/2013   OSA (obstructive sleep apnea) 11/13/2011   Enlarged thyroid 12/29/2010   FRACTURE, RIGHT HAND 12/15/2010   SINUSITIS - ACUTE-NOS 09/04/2010   Acute pain of right knee 12/05/2009   SHOULDER PAIN, RIGHT 10/25/2009   CELLULITIS AND ABSCESS OF TRUNK 06/22/2008   Hyperlipidemia 03/22/2008   ABSCESS, SKIN 06/18/2007   Essential hypertension 02/25/2007    Past Surgical History:  Procedure Laterality Date   ADENOIDECTOMY   08/2011   Simeon Craft--- [facility] ent   CLOSED REDUCTION HAND FRACTURE  12/2010   Right hand   COLONOSCOPY     KNEE SURGERY     bilat scops   POLYPECTOMY       Home Medications    Prior to Admission medications   Medication Sig Start Date End Date Taking? Authorizing Provider  amoxicillin-clavulanate (AUGMENTIN) 875-125 MG tablet Take 1 tablet by mouth 2 (two) times daily. Patient not taking: Reported on 05/06/2022 11/09/21   Carollee Herter, Alferd Apa, DO  atorvastatin (LIPITOR) 40 MG tablet Take 1 tablet (40 mg total) by mouth at bedtime. 12/04/21   Roma Schanz R, DO  COVID-19 mRNA bivalent vaccine, Pfizer, (PFIZER COVID-19 VAC BIVALENT) injection Inject into the muscle. 09/28/21   Carlyle Basques, MD  doxazosin (CARDURA) 1 MG tablet Take 1 tablet (1 mg total) by mouth daily. 07/12/21   Ann Held, DO  doxycycline (VIBRA-TABS) 100 MG tablet Take 1 tablet (100 mg total) by mouth 2 (two) times daily. Patient not taking: Reported on 05/06/2022 01/16/22   Carollee Herter, Alferd Apa, DO  fluticasone [facility]) 50 MCG/ACT nasal spray Place 2 sprays into both nostrils daily. 11/09/21   Ann Held, DO  hydrochlorothiazide (HYDRODIURIL) 25 MG tablet Take 1 tablet (25 mg total) by mouth daily. Patient not taking: Reported on 05/06/2022  10/19/21   Vickie Epley, MD  levocetirizine (XYZAL) 5 MG tablet Take 1 tablet (5 mg total) by mouth every evening. Patient not taking: Reported on 05/06/2022 11/09/21   Carollee Herter, Alferd Apa, DO  meloxicam (MOBIC) 15 MG tablet Take 1 tablet (15 mg total) by mouth daily. Patient not taking: Reported on 05/06/2022 08/22/21   Dene Gentry, MD  methocarbamol (ROBAXIN) 500 MG tablet Take 1 tablet (500 mg total) by mouth 4 (four) times daily. Patient not taking: Reported on 05/06/2022 08/28/21   Roma Schanz R, DO  metoprolol succinate (TOPROL-XL) 25 MG 24 hr tablet TAKE 1 TABLET (25 MG TOTAL) BY MOUTH DAILY. 11/21/20 11/21/21  Ann Held, DO   NIFEdipine (PROCARDIA XL/NIFEDICAL XL) 60 MG 24 hr tablet Take 1 tablet (60 mg total) by mouth daily. 10/19/21   Vickie Epley, MD  potassium chloride SA (KLOR-CON M) 20 MEQ tablet Take 3 tablets (60 mEq total) by mouth daily. 11/13/21   Roma Schanz R, DO  sildenafil (VIAGRA) 100 MG tablet TAKE 0.5-1 TABLETS (50-100 MG TOTAL) BY MOUTH DAILY AS NEEDED FOR ERECTILE DYSFUNCTION. 11/09/21 11/09/22  Ann Held, DO    Family History Family History  Problem Relation Age of Onset   Hypertension Mother    Cancer Mother 58       breast   Breast cancer Mother    Hypertension Father    Hyperlipidemia Father    Stroke Father    Cancer Father        prostate   Colon cancer Father        dx'd in his 72's   Hypertension Sister    Brain cancer Sister    Cancer Maternal Aunt 37       breast   Breast cancer Maternal Aunt    Heart disease Maternal Uncle        mi   Hypertension Maternal Uncle    Hypertension Maternal Grandmother    Alzheimer's disease Maternal Grandmother    Heart disease Maternal Grandfather        mi   Hypertension Maternal Grandfather    Hyperlipidemia Maternal Grandfather    Stroke Maternal Grandfather    Stroke Maternal Uncle    Hypertension Maternal Uncle    Hypertension Other    Colon polyps Neg Hx    Rectal cancer Neg Hx    Stomach cancer Neg Hx    Esophageal cancer Neg Hx     Social History Social History   Tobacco Use   Smoking status: Never   Smokeless tobacco: Never  Vaping Use   Vaping Use: Never used  Substance Use Topics   Alcohol use: No    Alcohol/week: 0.0 standard drinks of alcohol   Drug use: No     Allergies   Ace inhibitors   Review of Systems Review of Systems  Per HPI  Physical Exam Triage Vital Signs ED Triage Vitals  Enc Vitals Group     BP 05/06/22 1510 137/71     Pulse Rate 05/06/22 1510 66     Resp 05/06/22 1510 18     Temp 05/06/22 1510 98.2 F (36.8 C)     Temp Source 05/06/22 1510 Oral      SpO2 05/06/22 1510 96 %     Weight --      Height --      Head Circumference --      Peak Flow --      Pain  Score 05/06/22 1505 4     Pain Loc --      Pain Edu? --      Excl. in [place]? --    No data found.  Updated Vital Signs BP 137/71 (BP Location: Right Arm) Comment (BP Location): large cuff  Pulse 66   Temp 98.2 F (36.8 C) (Oral)   Resp 18   SpO2 96%    Physical Exam Vitals and nursing note reviewed.  Constitutional:      General: He is not in acute distress.    Appearance: Normal appearance.  Cardiovascular:     Rate and Rhythm: Normal rate and regular rhythm.     Heart sounds: Normal heart sounds.  Pulmonary:     Effort: Pulmonary effort is normal.     Breath sounds: Normal breath sounds.  Musculoskeletal:        General: Normal range of motion.       Legs:     Comments: He is moderately tender to palpation along the right tibia. Hematoma noted mid lower leg. Areas noted above. No open laceration or abrasion.   Skin:    General: Skin is warm and dry.     Findings: No bruising.  Neurological:     Mental Status: He is alert and oriented to person, place, and time.     Comments: Distal sensation intact. Full ROM at knee and ankle. Pedal pulses intact    UC Treatments / Results  Labs (all labs ordered are listed, but only abnormal results are displayed) Labs Reviewed - No data to display  EKG  Radiology DG Tibia/Fibula Right  Result Date: 05/06/2022 CLINICAL DATA:  Injury with swelling. EXAM: RIGHT TIBIA AND FIBULA - 2 VIEW COMPARISON:  None Available. FINDINGS: There are subtle small horizontal lucencies through the anterior aspect of the mid tibial cortex. No dislocation. There is soft tissue swelling of the ankle and lower extremity. There is no foreign body identified. There are moderate degenerative changes of the right knee. IMPRESSION: 1. Findings suspicious for stress fractures of the anterior tibial cortex. Please correlate clinically. 2. Soft tissue  swelling of the lower extremity and ankle. Electronically Signed   By: Ronney Asters M.D.   On: 05/06/2022 16:20    Procedures Procedures  Medications Ordered in UC Medications  acetaminophen (TYLENOL) tablet 650 mg (650 mg Oral Given 05/06/22 1602)    Initial Impression / Assessment and Plan / UC Course  I have reviewed the triage vital signs and the nursing notes.  Pertinent labs & imaging results that were available during my care of the patient were reviewed by me and considered in my medical decision making (see chart for details).  Tylenol dose without much relief of pain. Right tib/fib xray suspicious for stress fractures of anterior tibia. Soft tissue swelling.  Spoke with on call ortho Dr. Griffin Basil. He reviewed xrays and recommend, crutches, touch down weight bearing, limited activity. Will see him in clinic next week. Patient placed in crutches. Contact info given for [employer]. Agrees to plan.  Final Clinical Impressions(s) / UC Diagnoses   Final diagnoses:  Stress fracture, right tibia, initial encounter for fracture     Discharge Instructions      You will need to use the crutches and be nonweightbearing on the right leg.  It is important to keep off the leg as you are at great risk for fracture.  Please call the sports medicine clinic; they will see you next week for a follow-up  visit.  Tylenol for pain, ice and elevate the leg.     ED Prescriptions   None    PDMP not reviewed this encounter.   [place], [place] 05/06/22 [id number]

## 2022-05-07 ENCOUNTER — Ambulatory Visit (INDEPENDENT_AMBULATORY_CARE_PROVIDER_SITE_OTHER): Payer: BC Managed Care – PPO | Admitting: Family

## 2022-05-07 ENCOUNTER — Telehealth: Payer: Self-pay

## 2022-05-07 VITALS — BP 134/78 | HR 63 | Temp 98.9°F | Resp 16 | Wt 228.0 lb

## 2022-05-07 DIAGNOSIS — I10 Essential (primary) hypertension: Secondary | ICD-10-CM | POA: Diagnosis not present

## 2022-05-07 DIAGNOSIS — M84361A Stress fracture, right tibia, initial encounter for fracture: Secondary | ICD-10-CM

## 2022-05-07 NOTE — Assessment & Plan Note (Addendum)
Initial bp was elevated this AM.  Repeat BP was WNL. Continue metoprolol, hctz, and nifedipine.

## 2022-05-07 NOTE — Progress Notes (Signed)
Subjective:   By signing my name below, I, [name], attest that this documentation has been prepared under the direction and in the presence of Debbrah Alar, NP 05/07/2022    Patient ID: Bobby Neer., male    DOB: 02/01/1966, 56 y.o.   MRN: [id number]  Chief Complaint  Patient presents with   Leg Injury    Patient here for right tibial stress fracture    HPI Patient is in today for a office visit.   Leg injury- He reports playing basket ball this past weekend and crashed into someone and hurt his right leg. Shortly following his accident his pain worsened and his ankle swelled. He then went to urgent care and received an X-ray and found he had a possible tibial cortex stress fracture. He is taking tylenol to manage his pain at this time. He is managing his pain with tylenol at this time.   Blood pressure- His blood pressure is elevated during this visit. He has not taken his 25 mg hydrochlorothiazide nor 25 mg metoprolol succinate this morning.  BP Readings from Last 3 Encounters:  05/07/22 134/78  05/06/22 137/71  01/16/22 140/74   Pulse Readings from Last 3 Encounters:  05/07/22 63  05/06/22 66  01/16/22 71    Health Maintenance Due  Topic Date Due   COVID-19 Vaccine (4 - Booster for [employer] series) 11/23/2021    Past Medical History:  Diagnosis Date   Allergy    Bell's palsy    History   Hyperlipidemia    Hypertension    Sleep apnea    uses CPAP nightly    Past Surgical History:  Procedure Laterality Date   ADENOIDECTOMY  08/2011   [facility]on [facility]--- gso ent   CLOSED REDUCTION HAND FRACTURE  12/2010   Right hand   COLONOSCOPY     KNEE SURGERY     bilat scops   POLYPECTOMY      Family History  Problem Relation Age of Onset   Hypertension Mother    Cancer Mother 83       breast   Breast cancer Mother    Hypertension Father    Hyperlipidemia Father    Stroke Father    Cancer Father        prostate   Colon cancer Father        dx'd in his  62's   Hypertension Sister    Brain cancer Sister    Cancer Maternal Aunt 37       breast   Breast cancer Maternal Aunt    Heart disease Maternal Uncle        mi   Hypertension Maternal Uncle    Hypertension Maternal Grandmother    Alzheimer's disease Maternal Grandmother    Heart disease Maternal Grandfather        mi   Hypertension Maternal Grandfather    Hyperlipidemia Maternal Grandfather    Stroke Maternal Grandfather    Stroke Maternal Uncle    Hypertension Maternal Uncle    Hypertension Other    Colon polyps Neg Hx    Rectal cancer Neg Hx    Stomach cancer Neg Hx    Esophageal cancer Neg Hx     Social History   Socioeconomic History   Marital status: Married    Spouse name: Not on file   Number of children: Y   Years of education: Not on file   Highest education level: Not on file  Occupational History   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, building services    Occupation: Control and instrumentation engineer  Tobacco Use   Smoking status: Never   Smokeless tobacco: Never  Vaping Use   Vaping Use: Never used  Substance and Sexual Activity   Alcohol use: No    Alcohol/week: 0.0 standard drinks of alcohol   Drug use: No   Sexual activity: Yes    Partners: Male  Other Topics Concern   Not on file  Social History Narrative   Regular Exercise- qd    Social Determinants of Health   Financial Resource Strain: Not on file  Food Insecurity: Not on file  Transportation Needs: Not on file  Physical Activity: Not on file  Stress: Not on file  Social Connections: Not on file  Intimate Partner Violence: Not on file    Outpatient Medications Prior to Visit  Medication Sig Dispense Refill   atorvastatin (LIPITOR) 40 MG tablet Take 1 tablet (40 mg total) by mouth at bedtime. 90 tablet 1   doxazosin (CARDURA) 1 MG tablet Take 1 tablet (1 mg total) by mouth daily. 90 tablet 1   fluticasone (FLONASE) 50 MCG/ACT nasal spray Place 2 sprays into both nostrils daily. 16 g 6    hydrochlorothiazide (HYDRODIURIL) 25 MG tablet Take 1 tablet (25 mg total) by mouth daily. 30 tablet 10   levocetirizine (XYZAL) 5 MG tablet Take 1 tablet (5 mg total) by mouth every evening. 30 tablet 2   NIFEdipine (PROCARDIA XL/NIFEDICAL XL) 60 MG 24 hr tablet Take 1 tablet (60 mg total) by mouth daily. 30 tablet 10   potassium chloride SA (KLOR-CON M) 20 MEQ tablet Take 3 tablets (60 mEq total) by mouth daily. 90 tablet 6   sildenafil (VIAGRA) 100 MG tablet TAKE 0.5-1 TABLETS (50-100 MG TOTAL) BY MOUTH DAILY AS NEEDED FOR ERECTILE DYSFUNCTION. 10 tablet 11   metoprolol succinate (TOPROL-XL) 25 MG 24 hr tablet TAKE 1 TABLET (25 MG TOTAL) BY MOUTH DAILY. 90 tablet 1   amoxicillin-clavulanate (AUGMENTIN) 875-125 MG tablet Take 1 tablet by mouth 2 (two) times daily. (Patient not taking: Reported on 05/06/2022) 20 tablet 0   COVID-19 mRNA bivalent vaccine, Pfizer, (PFIZER COVID-19 VAC BIVALENT) injection Inject into the muscle. 0.3 mL 0   doxycycline (VIBRA-TABS) 100 MG tablet Take 1 tablet (100 mg total) by mouth 2 (two) times daily. (Patient not taking: Reported on 05/06/2022) 20 tablet 0   meloxicam (MOBIC) 15 MG tablet Take 1 tablet (15 mg total) by mouth daily. (Patient not taking: Reported on 05/06/2022) 30 tablet 0   methocarbamol (ROBAXIN) 500 MG tablet Take 1 tablet (500 mg total) by mouth 4 (four) times daily. (Patient not taking: Reported on 05/06/2022) 45 tablet 1   Facility-Administered Medications Prior to Visit  Medication Dose Route Frequency Provider Last Rate Last Admin   0.9 %  sodium chloride infusion  500 mL Intravenous Continuous Irene Shipper, MD       0.9 %  sodium chloride infusion  500 mL Intravenous Once Irene Shipper, MD        Allergies  Allergen Reactions   Ace Inhibitors Other (See Comments)    angioedema    Review of Systems  Musculoskeletal:        (+)left ankle swelling (+)pain in left ankle       Objective:    Physical Exam Constitutional:       General: He is not in acute distress.    Appearance: Normal appearance. He is not ill-appearing.  HENT:  Head: Normocephalic and atraumatic.     Right Ear: External ear normal.     Left Ear: External ear normal.  Eyes:     Extraocular Movements: Extraocular movements intact.     Pupils: Pupils are equal, round, and reactive to light.  Cardiovascular:     Rate and Rhythm: Normal rate and regular rhythm.     Heart sounds: Normal heart sounds. No murmur heard.    No gallop.     Comments: Blood pressure measured 134/78 during manual recheck Pulmonary:     Effort: Pulmonary effort is normal. No respiratory distress.     Breath sounds: Normal breath sounds. No wheezing or rales.  Musculoskeletal:     Comments: + soft tissue swelling noted overlying right lower shin  Skin:    General: Skin is warm and dry.  Neurological:     Mental Status: He is alert and oriented to person, place, and time.  Psychiatric:        Judgment: Judgment normal.     BP 134/78   Pulse 63   Temp 98.9 F (37.2 C) (Oral)   Resp 16   Wt 228 lb (103.4 kg)   SpO2 100%   BMI 30.08 kg/m  Wt Readings from Last 3 Encounters:  05/07/22 228 lb (103.4 kg)  01/16/22 229 lb (103.9 kg)  11/09/21 224 lb 3.2 oz (101.7 kg)       Assessment & Plan:   Problem List Items Addressed This Visit       Unprioritized   Stress fracture of right tibia - Primary    New. Patient is using crutches.  He already has a referral to [facility]. He will call them back this AM to schedule an appointment. In the meantime, recommended that he use tylenol as needed for pain and continue non-weight bearing status until cleared by orthopedics.      Essential hypertension    Initial bp was elevated this AM.  Repeat BP was WNL. Continue metoprolol, hctz, and nifedipine.         No orders of the defined types were placed in this encounter.   I, Nance Pear, NP, personally preformed the services described  in this documentation.  All medical record entries made by the scribe were at my direction and in my presence.  I have reviewed the chart and discharge instructions (if applicable) and agree that the record reflects my personal performance and is accurate and complete. 05/07/2022   I,[name],acting as a Education administrator for Nance Pear, NP.,have documented all relevant documentation on the behalf of Nance Pear, NP,as directed by  Nance Pear, NP while in the presence of Nance Pear, NP.   Nance Pear, NP

## 2022-05-07 NOTE — Telephone Encounter (Signed)
Bobby Castaneda Gender: Male DOB: 1966-05-21 Age: 56 Y 14 M 6 D Return Phone Number: [phone] (Provider Roma Schanz- MD Contact Type Call Who Is Calling Patient / Member / Family / Caregiver Call Type Triage / Clinical Relationship To Patient Self Return Phone Number [phone] (Primary) Chief Complaint Bruise Reason for Call Symptomatic / Request for Health Information Initial Comment Caller states he was playing ball a week ago. His shin was hurt. He has a bruise and his ankle is now swelling. Translation N  Patient scheduled to see Debbrah Alar FNP

## 2022-05-07 NOTE — Assessment & Plan Note (Signed)
New. Patient is using crutches.  He already has a referral to [facility]. He will call them back this AM to schedule an appointment. In the meantime, recommended that he use tylenol as needed for pain and continue non-weight bearing status until cleared by orthopedics.

## 2022-05-15 ENCOUNTER — Other Ambulatory Visit (HOSPITAL_COMMUNITY): Payer: Self-pay

## 2022-05-25 ENCOUNTER — Other Ambulatory Visit (HOSPITAL_COMMUNITY): Payer: Self-pay

## 2022-05-25 ENCOUNTER — Encounter: Payer: Self-pay | Admitting: Family Medicine

## 2022-05-25 ENCOUNTER — Ambulatory Visit (INDEPENDENT_AMBULATORY_CARE_PROVIDER_SITE_OTHER): Payer: BC Managed Care – PPO | Admitting: Family Medicine

## 2022-05-25 VITALS — BP 126/72 | HR 70 | Temp 98.1°F | Resp 18 | Ht 73.0 in | Wt 231.2 lb

## 2022-05-25 DIAGNOSIS — N529 Male erectile dysfunction, unspecified: Secondary | ICD-10-CM

## 2022-05-25 DIAGNOSIS — N181 Chronic kidney disease, stage 1: Secondary | ICD-10-CM

## 2022-05-25 DIAGNOSIS — Z0000 Encounter for general adult medical examination without abnormal findings: Secondary | ICD-10-CM | POA: Diagnosis not present

## 2022-05-25 DIAGNOSIS — E785 Hyperlipidemia, unspecified: Secondary | ICD-10-CM | POA: Diagnosis not present

## 2022-05-25 DIAGNOSIS — I10 Essential (primary) hypertension: Secondary | ICD-10-CM

## 2022-05-25 DIAGNOSIS — K635 Polyp of colon: Secondary | ICD-10-CM

## 2022-05-25 LAB — CBC WITH DIFFERENTIAL/PLATELET
Basophils Absolute: 0.1 10*3/uL (ref 0.0–0.1)
Basophils Relative: 1.2 % (ref 0.0–3.0)
Eosinophils Absolute: 0.2 10*3/uL (ref 0.0–0.7)
Eosinophils Relative: 4.4 % (ref 0.0–5.0)
HCT: 38.1 % — ABNORMAL LOW (ref 39.0–52.0)
Hemoglobin: 12.6 g/dL — ABNORMAL LOW (ref 13.0–17.0)
Lymphocytes Relative: 38.8 % (ref 12.0–46.0)
Lymphs Abs: 1.8 10*3/uL (ref 0.7–4.0)
MCHC: 33.1 g/dL (ref 30.0–36.0)
MCV: 87.3 fl (ref 78.0–100.0)
Monocytes Absolute: 0.4 10*3/uL (ref 0.1–1.0)
Monocytes Relative: 8.4 % (ref 3.0–12.0)
Neutro Abs: 2.2 10*3/uL (ref 1.4–7.7)
Neutrophils Relative %: 47.2 % (ref 43.0–77.0)
Platelets: 268 10*3/uL (ref 150.0–400.0)
RBC: 4.37 Mil/uL (ref 4.22–5.81)
RDW: 13.7 % (ref 11.5–15.5)
WBC: 4.7 10*3/uL (ref 4.0–10.5)

## 2022-05-25 LAB — PSA: PSA: 3.65 ng/mL (ref 0.10–4.00)

## 2022-05-25 LAB — COMPREHENSIVE METABOLIC PANEL
ALT: 15 U/L (ref 0–53)
AST: 18 U/L (ref 0–37)
Albumin: 4.3 g/dL (ref 3.5–5.2)
Alkaline Phosphatase: 83 U/L (ref 39–117)
BUN: 19 mg/dL (ref 6–23)
CO2: 33 mEq/L — ABNORMAL HIGH (ref 19–32)
Calcium: 9.6 mg/dL (ref 8.4–10.5)
Chloride: 100 mEq/L (ref 96–112)
Creatinine, Ser: 1.32 mg/dL (ref 0.40–1.50)
GFR: 59.89 mL/min — ABNORMAL LOW (ref >60.00)
Glucose, Bld: 120 mg/dL — ABNORMAL HIGH (ref 70–99)
Potassium: 3.4 mEq/L — ABNORMAL LOW (ref 3.5–5.1)
Sodium: 140 mEq/L (ref 135–145)
Total Bilirubin: 0.6 mg/dL (ref 0.2–1.2)
Total Protein: 7.8 g/dL (ref 6.0–8.3)

## 2022-05-25 LAB — LIPID PANEL
Cholesterol: 152 mg/dL (ref 0–200)
HDL: 39.4 mg/dL (ref >39.00)
LDL Cholesterol: 78 mg/dL (ref 0–99)
NonHDL: 112.46
Total CHOL/HDL Ratio: 4
Triglycerides: 172 mg/dL — ABNORMAL HIGH (ref 0.0–149.0)
VLDL: 34.4 mg/dL (ref 0.0–40.0)

## 2022-05-25 LAB — TSH: TSH: 1.6 u[IU]/mL (ref 0.35–5.50)

## 2022-05-25 NOTE — Assessment & Plan Note (Signed)
Well controlled, no changes to meds. Encouraged heart healthy diet such as the DASH diet and exercise as tolerated.  °

## 2022-05-25 NOTE — Progress Notes (Signed)
Established Patient Office Visit  Subjective   Patient ID: Bobby Castaneda., male    DOB: 07-May-1966  Age: 56 y.o. MRN: [id number]  Chief Complaint  Patient presents with  . Annual Exam    Pt states not fasting     HPI\ Pt is here for cpe and f/u bp and chol.  No complaints.  He is being tx for a stress fracture of the tibia  --- healing well   Patient Active Problem List   Diagnosis Date Noted  . Stress fracture of right tibia 05/07/2022  . Abscess and cellulitis of gluteal region 01/16/2022  . Chest pain 06/23/2020  . Erectile dysfunction 10/26/2019  . Preventative health care 12/23/2016  . Angioedema 07/02/2016  . Chronic kidney disease (CKD), stage I 02/08/2016  . Injury of left elbow 07/06/2015  . Allergic rhinitis 03/10/2015  . Left thigh pain 02/10/2015  . Obesity (BMI 30-39.9) 02/23/2014  . 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 Bell's palsy 05/19/2013  . OSA (obstructive sleep apnea) 11/13/2011  . Enlarged thyroid 12/29/2010  . FRACTURE, RIGHT HAND 12/15/2010  . SINUSITIS - ACUTE-NOS 09/04/2010  . Acute pain of right knee 12/05/2009  . SHOULDER PAIN, RIGHT 10/25/2009  . CELLULITIS AND ABSCESS OF TRUNK 06/22/2008  . Hyperlipidemia 03/22/2008  . ABSCESS, SKIN 06/18/2007  . Essential hypertension 02/25/2007   Past Medical History:  Diagnosis Date  . Allergy   . Bell's palsy    History  . Hyperlipidemia   . Hypertension   . Sleep apnea    uses CPAP nightly   Past Surgical History:  Procedure Laterality Date  . ADENOIDECTOMY  08/2011   [facility]--- gso ent  . CLOSED REDUCTION HAND FRACTURE  12/2010   Right hand  . COLONOSCOPY    . KNEE SURGERY     bilat scops  . POLYPECTOMY     Social History   Tobacco Use  . Smoking status: Never  . Smokeless tobacco: Never  Vaping Use  . Vaping Use: Never used  Substance Use Topics  . Alcohol use: No    Alcohol/week: 0.0 standard drinks of alcohol  . Drug use: No   Social History   Socioeconomic History  . Marital  status: Married    Spouse name: Not on file  . Number of children: Y  . Years of education: Not on file  . Highest education level: Not on file  Occupational History  . Occupation: combine Optician, dispensing: Engineer, building services   . Occupation: Control and instrumentation engineer  Tobacco Use  . Smoking status: Never  . Smokeless tobacco: Never  Vaping Use  . Vaping Use: Never used  Substance and Sexual Activity  . Alcohol use: No    Alcohol/week: 0.0 standard drinks of alcohol  . Drug use: No  . Sexual activity: Yes    Partners: Male  Other Topics Concern  . Not on file  Social History Narrative   Regular Exercise- qd    Social Determinants of Health   Financial Resource Strain: Not on file  Food Insecurity: Not on file  Transportation Needs: Not on file  Physical Activity: Not on file  Stress: Not on file  Social Connections: Not on file  Intimate Partner Violence: Not on file   Family Status  Relation Name Status  . Mother  Alive  . Father  Deceased  . Sister  Deceased  . Mat Aunt  Deceased at age 1  . Mat Uncle  Deceased  . MGM  Deceased  .  MGF  Deceased at age 78  . Mat Uncle  Deceased  . Other  (Not Specified)  . Neg Hx  (Not Specified)   Family History  Problem Relation Age of Onset  . Hypertension Mother   . Cancer Mother 59       breast  . Breast cancer Mother   . Hypertension Father   . Hyperlipidemia Father   . Stroke Father   . Cancer Father        prostate  . Colon cancer Father        dx'd in his 76's  . Hypertension Sister   . Brain cancer Sister   . Cancer Maternal Aunt 37       breast  . Breast cancer Maternal Aunt   . Heart disease Maternal Uncle        mi  . Hypertension Maternal Uncle   . Hypertension Maternal Grandmother   . Alzheimer's disease Maternal Grandmother   . Heart disease Maternal Grandfather        mi  . Hypertension Maternal Grandfather   . Hyperlipidemia Maternal Grandfather   . Stroke Maternal Grandfather   . Stroke  Maternal Uncle   . Hypertension Maternal Uncle   . Hypertension Other   . Colon polyps Neg Hx   . Rectal cancer Neg Hx   . Stomach cancer Neg Hx   . Esophageal cancer Neg Hx    Allergies  Allergen Reactions  . Ace Inhibitors Other (See Comments)    angioedema      Review of Systems  Constitutional:  Negative for fever and malaise/fatigue.  HENT:  Negative for congestion.   Eyes:  Negative for blurred vision.  Respiratory:  Negative for shortness of breath.   Cardiovascular:  Negative for chest pain, palpitations and leg swelling.  Gastrointestinal:  Negative for abdominal pain, blood in stool and nausea.  Genitourinary:  Negative for dysuria and frequency.  Musculoskeletal:  Negative for falls.  Skin:  Negative for rash.  Neurological:  Negative for dizziness, loss of consciousness and headaches.  Endo/Heme/Allergies:  Negative for environmental allergies.  Psychiatric/Behavioral:  Negative for depression. The patient is not nervous/anxious.      Objective:     BP 126/72 (BP Location: Left Arm, Patient Position: Sitting, Cuff Size: Large)   Pulse 70   Temp 98.1 F (36.7 C) (Oral)   Resp 18   Ht '6\' 1"'$  (1.854 m)   Wt 231 lb 3.2 oz (104.9 kg)   SpO2 96%   BMI 30.50 kg/m  BP Readings from Last 3 Encounters:  05/25/22 126/72  05/07/22 134/78  05/06/22 137/71   Wt Readings from Last 3 Encounters:  05/25/22 231 lb 3.2 oz (104.9 kg)  05/07/22 228 lb (103.4 kg)  01/16/22 229 lb (103.9 kg)   SpO2 Readings from Last 3 Encounters:  05/25/22 96%  05/07/22 100%  05/06/22 96%      Physical Exam Vitals and nursing note reviewed.  Constitutional:      General: He is not in acute distress.    Appearance: He is well-developed. He is not diaphoretic.  HENT:     Head: Normocephalic and atraumatic.     Right Ear: External ear normal.     Left Ear: External ear normal.     Nose: Nose normal.     Mouth/Throat:     Pharynx: No oropharyngeal exudate.  Eyes:     General:         Right eye: No discharge.  Left eye: No discharge.     Conjunctiva/sclera: Conjunctivae normal.     Pupils: Pupils are equal, round, and reactive to light.  Neck:     Thyroid: No thyromegaly.     Vascular: No JVD.  Cardiovascular:     Rate and Rhythm: Normal rate and regular rhythm.     Heart sounds: No murmur heard. Pulmonary:     Effort: Pulmonary effort is normal. No respiratory distress.     Breath sounds: Normal breath sounds. No wheezing or rales.  Chest:     Chest wall: No tenderness.  Abdominal:     General: Bowel sounds are normal. There is no distension.     Palpations: Abdomen is soft. There is no mass.     Tenderness: There is no abdominal tenderness. There is no guarding or rebound.  Musculoskeletal:        General: No tenderness. Normal range of motion.     Cervical back: Normal range of motion and neck supple.  Lymphadenopathy:     Cervical: No cervical adenopathy.  Skin:    General: Skin is warm and dry.     Findings: No erythema or rash.  Neurological:     Mental Status: He is alert and oriented to person, place, and time.     Cranial Nerves: No cranial nerve deficit.     Motor: No abnormal muscle tone.     Deep Tendon Reflexes: Reflexes are normal and symmetric. Reflexes normal.  Psychiatric:        Behavior: Behavior normal.        Thought Content: Thought content normal.        Judgment: Judgment normal.    No results found for any visits on 05/25/22.  Last CBC Lab Results  Component Value Date   WBC 4.4 05/08/2021   HGB 12.6 (L) 05/08/2021   HCT 37.4 (L) 05/08/2021   MCV 87.8 05/08/2021   MCH 29.5 12/21/2016   RDW 13.4 05/08/2021   PLT 205.0 78/24/2353   Last metabolic panel Lab Results  Component Value Date   GLUCOSE 87 11/09/2021   NA 140 11/09/2021   K 3.2 (L) 11/09/2021   CL 100 11/09/2021   CO2 37 (H) 11/09/2021   BUN 20 11/09/2021   CREATININE 1.42 11/09/2021   GFRNONAA 55 (L) 09/27/2020   CALCIUM 9.3 11/09/2021    PHOS 3.3 02/09/2016   PROT 8.1 11/09/2021   ALBUMIN 4.3 11/09/2021   BILITOT 0.6 11/09/2021   ALKPHOS 76 11/09/2021   AST 23 11/09/2021   ALT 16 11/09/2021   ANIONGAP 13 09/04/2014   Last lipids Lab Results  Component Value Date   CHOL 123 11/09/2021   HDL 34.20 (L) 11/09/2021   LDLCALC 62 11/09/2021   TRIG 137.0 11/09/2021   CHOLHDL 4 11/09/2021   Last hemoglobin A1c No results found for: "HGBA1C" Last thyroid functions Lab Results  Component Value Date   TSH 1.59 05/08/2021   Last vitamin D No results found for: "25OHVITD2", "25OHVITD3", "VD25OH" Last vitamin B12 and Folate No results found for: "VITAMINB12", "FOLATE"    The ASCVD Risk score (Arnett DK, et al., 2019) failed to calculate for the following reasons:   The valid total cholesterol range is 130 to 320 mg/dL    Assessment & Plan:   Problem List Items Addressed This Visit       Unprioritized   Erectile dysfunction   Relevant Orders   PSA   Preventative health care - Primary    ghm  utd Check labs  See avs      Relevant Orders   CBC with Differential/Platelet   Comprehensive metabolic panel   Lipid panel   PSA   TSH   Hyperlipidemia    Encourage heart healthy diet such as MIND or DASH diet, increase exercise, avoid trans fats, simple carbohydrates and processed foods, consider a krill or fish or flaxseed oil cap daily.       Relevant Orders   Comprehensive metabolic panel   Lipid panel   Essential hypertension    Well controlled, no changes to meds. Encouraged heart healthy diet such as the DASH diet and exercise as tolerated.       Relevant Orders   CBC with Differential/Platelet   Comprehensive metabolic panel   Lipid panel   PSA   Chronic kidney disease (CKD), stage I    F/u per nephrology      Other Visit Diagnoses     Polyp of colon, unspecified part of colon, unspecified type       Relevant Orders   Ambulatory referral to Gastroenterology       Return in about 6  months (around 11/25/2022), or if symptoms worsen or fail to improve.    Ann Held, DO

## 2022-05-25 NOTE — Assessment & Plan Note (Signed)
ghm utd Check labs  See avs  

## 2022-05-25 NOTE — Assessment & Plan Note (Signed)
F/u per nephrology

## 2022-05-25 NOTE — Assessment & Plan Note (Addendum)
Encourage heart healthy diet such as MIND or DASH diet, increase exercise, avoid trans fats, simple carbohydrates and processed foods, consider a krill or fish or flaxseed oil cap daily.  °

## 2022-05-25 NOTE — Patient Instructions (Signed)

## 2022-06-13 ENCOUNTER — Other Ambulatory Visit (HOSPITAL_COMMUNITY): Payer: Self-pay

## 2022-06-14 ENCOUNTER — Other Ambulatory Visit: Payer: Self-pay | Admitting: Family Medicine

## 2022-06-15 ENCOUNTER — Other Ambulatory Visit (HOSPITAL_COMMUNITY): Payer: Self-pay

## 2022-06-15 MED ORDER — ATORVASTATIN CALCIUM 40 MG PO TABS
40.0000 mg | ORAL_TABLET | Freq: Every day | ORAL | 1 refills | Status: DC
  Filled 2022-06-15: qty 90, 90d supply, fill #0
  Filled 2022-11-04: qty 90, 90d supply, fill #1

## 2022-06-27 ENCOUNTER — Other Ambulatory Visit (HOSPITAL_COMMUNITY): Payer: Self-pay

## 2022-06-27 ENCOUNTER — Encounter: Payer: Self-pay | Admitting: Internal Medicine

## 2022-07-23 ENCOUNTER — Other Ambulatory Visit (HOSPITAL_COMMUNITY): Payer: Self-pay

## 2022-07-23 ENCOUNTER — Other Ambulatory Visit: Payer: Self-pay | Admitting: Family Medicine

## 2022-07-23 DIAGNOSIS — I10 Essential (primary) hypertension: Secondary | ICD-10-CM

## 2022-07-24 ENCOUNTER — Other Ambulatory Visit (HOSPITAL_COMMUNITY): Payer: Self-pay

## 2022-07-24 MED ORDER — POTASSIUM CHLORIDE CRYS ER 20 MEQ PO TBCR
60.0000 meq | EXTENDED_RELEASE_TABLET | Freq: Every day | ORAL | 1 refills | Status: DC
  Filled 2022-07-24: qty 270, 90d supply, fill #0
  Filled 2022-11-04: qty 270, 90d supply, fill #1

## 2022-07-30 ENCOUNTER — Other Ambulatory Visit (HOSPITAL_COMMUNITY): Payer: Self-pay

## 2022-08-08 ENCOUNTER — Other Ambulatory Visit (HOSPITAL_COMMUNITY): Payer: Self-pay

## 2022-09-03 ENCOUNTER — Other Ambulatory Visit (HOSPITAL_COMMUNITY): Payer: Self-pay

## 2022-09-10 ENCOUNTER — Telehealth: Payer: Self-pay | Admitting: Family Medicine

## 2022-09-10 NOTE — Telephone Encounter (Signed)
Pt called stating that he was wondering what to do/take to help treat a stomach virus. Please advise.

## 2022-09-10 NOTE — Telephone Encounter (Signed)
Hydrate, clear fluids, soups, Pepto?

## 2022-09-12 NOTE — Telephone Encounter (Signed)
Left message on machine for patient to call back to see how he is doing or if he still needs [place], since its been a couple days.

## 2022-09-18 ENCOUNTER — Other Ambulatory Visit (HOSPITAL_COMMUNITY): Payer: Self-pay

## 2022-09-18 ENCOUNTER — Ambulatory Visit (AMBULATORY_SURGERY_CENTER): Payer: BC Managed Care – PPO

## 2022-09-18 VITALS — Ht 73.0 in | Wt 228.0 lb

## 2022-09-18 DIAGNOSIS — Z86010 Personal history of colonic polyps: Secondary | ICD-10-CM

## 2022-09-18 DIAGNOSIS — K635 Polyp of colon: Secondary | ICD-10-CM

## 2022-09-18 MED ORDER — NA SULFATE-K SULFATE-MG SULF 17.5-3.13-1.6 GM/177ML PO SOLN
1.0000 | ORAL | 0 refills | Status: DC
  Filled 2022-09-18: qty 354, 1d supply, fill #0

## 2022-09-18 NOTE — Progress Notes (Signed)

## 2022-10-02 ENCOUNTER — Other Ambulatory Visit: Payer: Self-pay | Admitting: Cardiology

## 2022-10-02 ENCOUNTER — Other Ambulatory Visit (HOSPITAL_COMMUNITY): Payer: Self-pay

## 2022-10-02 MED ORDER — HYDROCHLOROTHIAZIDE 25 MG PO TABS
25.0000 mg | ORAL_TABLET | Freq: Every day | ORAL | 0 refills | Status: DC
  Filled 2022-10-02: qty 30, 30d supply, fill #0

## 2022-10-03 ENCOUNTER — Encounter: Payer: BC Managed Care – PPO | Admitting: Internal Medicine

## 2022-10-03 ENCOUNTER — Other Ambulatory Visit (HOSPITAL_COMMUNITY): Payer: Self-pay

## 2022-10-05 ENCOUNTER — Other Ambulatory Visit (HOSPITAL_COMMUNITY): Payer: Self-pay

## 2022-10-05 ENCOUNTER — Other Ambulatory Visit: Payer: Self-pay | Admitting: *Deleted

## 2022-10-18 ENCOUNTER — Other Ambulatory Visit: Payer: Self-pay | Admitting: Cardiology

## 2022-10-18 ENCOUNTER — Other Ambulatory Visit (HOSPITAL_COMMUNITY): Payer: Self-pay

## 2022-10-25 ENCOUNTER — Other Ambulatory Visit (HOSPITAL_COMMUNITY): Payer: Self-pay

## 2022-10-29 ENCOUNTER — Encounter: Payer: Self-pay | Admitting: Cardiology

## 2022-10-30 ENCOUNTER — Other Ambulatory Visit: Payer: Self-pay | Admitting: Family Medicine

## 2022-10-30 DIAGNOSIS — I10 Essential (primary) hypertension: Secondary | ICD-10-CM

## 2022-10-30 NOTE — Telephone Encounter (Signed)
Prescription Request  10/30/2022  Is this a "Controlled Substance" medicine? No  LOV: 05/25/2022  What is the name of the medication or equipment?   Rx #: [id number]  NIFEdipine (PROCARDIA XL/NIFEDICAL XL) 60 MG 24 hr tablet [[id number]]  DISCONTINUED   Rx #: [id number]  potassium chloride SA (KLOR-CON M) 20 MEQ tablet [[id number]]   Have you contacted your pharmacy to request a refill? No   Which pharmacy would you like this sent to?   [facility] [address] [place] [address] Phone: [phone] Fax: [phone]   Patient notified that their request is being sent to the clinical staff for review and that they should receive a response within 2 business days.   Please advise at Mobile [phone] (mobile)

## 2022-10-31 NOTE — Telephone Encounter (Signed)
Called patient. LDVM. Pt has 3 different blood pressure medications on his list and the one he requested has been discontinued. Needed to confirm which medications he's been taking.

## 2022-11-01 NOTE — Telephone Encounter (Signed)
Patient stated he needs a refill on the Potassium chloride, Nifedipine, and the Hydrochlorothiazide. Advised patient that the last medication was given by Dr. Quentin Ore and he would be the one to refill it. Patient stated he called his office office and they told him it was not that doctor that prescribed it and to call his PCP. Please advice.

## 2022-11-04 ENCOUNTER — Other Ambulatory Visit: Payer: Self-pay | Admitting: Cardiology

## 2022-11-05 ENCOUNTER — Encounter: Payer: Self-pay | Admitting: Internal Medicine

## 2022-11-05 ENCOUNTER — Other Ambulatory Visit (HOSPITAL_COMMUNITY): Payer: Self-pay

## 2022-11-05 ENCOUNTER — Other Ambulatory Visit: Payer: Self-pay

## 2022-11-05 MED ORDER — POTASSIUM CHLORIDE CRYS ER 20 MEQ PO TBCR
60.0000 meq | EXTENDED_RELEASE_TABLET | Freq: Every day | ORAL | 1 refills | Status: DC
  Filled 2023-02-04: qty 270, 90d supply, fill #0
  Filled 2023-05-07: qty 270, 90d supply, fill #1

## 2022-11-05 MED ORDER — HYDROCHLOROTHIAZIDE 25 MG PO TABS
25.0000 mg | ORAL_TABLET | Freq: Every day | ORAL | 0 refills | Status: DC
  Filled 2022-11-05: qty 15, 15d supply, fill #0

## 2022-11-05 NOTE — Telephone Encounter (Signed)
Looks like pt just needs the nifedipine.  Looks like somewhere someone d/c it off his list.  Dr. Claudie Revering office wanted [place] fill and they have been filling for a while.  They filled his hctz.  Are you ok with filling the nifedipine?

## 2022-11-05 NOTE — Addendum Note (Signed)
Addended by: Kem Boroughs D on: 11/05/2022 02:59 PM   Modules accepted: Orders

## 2022-11-06 ENCOUNTER — Other Ambulatory Visit (HOSPITAL_COMMUNITY): Payer: Self-pay

## 2022-11-13 ENCOUNTER — Ambulatory Visit (AMBULATORY_SURGERY_CENTER): Payer: BC Managed Care – PPO | Admitting: Internal Medicine

## 2022-11-13 ENCOUNTER — Encounter: Payer: Self-pay | Admitting: Internal Medicine

## 2022-11-13 VITALS — BP 168/88 | HR 57 | Temp 98.2°F | Resp 15 | Ht 73.0 in | Wt 228.0 lb

## 2022-11-13 DIAGNOSIS — D125 Benign neoplasm of sigmoid colon: Secondary | ICD-10-CM | POA: Diagnosis not present

## 2022-11-13 DIAGNOSIS — Z86010 Personal history of colonic polyps: Secondary | ICD-10-CM

## 2022-11-13 DIAGNOSIS — D122 Benign neoplasm of ascending colon: Secondary | ICD-10-CM

## 2022-11-13 DIAGNOSIS — Z09 Encounter for follow-up examination after completed treatment for conditions other than malignant neoplasm: Secondary | ICD-10-CM

## 2022-11-13 DIAGNOSIS — K635 Polyp of colon: Secondary | ICD-10-CM

## 2022-11-13 MED ORDER — SODIUM CHLORIDE 0.9 % IV SOLN: 500.0000 mL | Freq: Once | INTRAVENOUS | Status: DC

## 2022-11-13 NOTE — Op Note (Signed)
[facility] Patient Name: Bobby Castaneda Procedure Date: 11/13/2022 8:53 AM MRN: [id number] Endoscopist: Docia Chuck. Henrene Pastor , MD, [id number] Age: 57 Referring MD:  Date of Birth: 10/07/1966 Gender: Male Account #: [id number] Procedure:                Colonoscopy with cold snare polypectomy x 2 Indications:              High risk colon cancer surveillance: Personal                            history of non-advanced adenoma, High risk colon                            cancer surveillance: Personal history of sessile                            serrated colon polyp (less than 10 mm in size) with                            no dysplasia. Family history of colon cancer in                            father around age 75. Previous examinations 2015                            and 2018 were compromised by poor prep. Last                            examination 2020. Medicines:                Monitored Anesthesia Care Procedure:                Pre-Anesthesia Assessment:                           - Prior to the procedure, a History and Physical                            was performed, and patient medications and                            allergies were reviewed. The patient's tolerance of                            previous anesthesia was also reviewed. The risks                            and benefits of the procedure and the sedation                            options and risks were discussed with the patient.                            All questions were answered, and informed consent  was obtained. Prior Anticoagulants: The patient has                            taken no anticoagulant or antiplatelet agents.                            After reviewing the risks and benefits, the patient                            was deemed in satisfactory condition to undergo the                            procedure.                           After obtaining informed consent, the  colonoscope                            was passed under direct vision. Throughout the                            procedure, the patient's blood pressure, pulse, and                            oxygen saturations were monitored continuously. The                            CF [id number] #[id number] was introduced through the anus                            and advanced to the the cecum, identified by                            appendiceal orifice and ileocecal valve. The                            ileocecal valve, appendiceal orifice, and rectum                            were photographed. The quality of the bowel                            preparation was excellent. The colonoscopy was                            performed without difficulty. The patient tolerated                            the procedure well. The bowel preparation used was                            extensive prep with MiraLAX followed by SUPREP via  split dose instruction. Scope In: 9:15:24 AM Scope Out: 9:32:49 AM Scope Withdrawal Time: 0 hours 10 minutes 36 seconds  Total Procedure Duration: 0 hours 17 minutes 25 seconds  Findings:                 Two polyps were found in the sigmoid colon and                            ascending colon. The polyps were 3 to 4 mm in size.                            These polyps were removed with a cold snare.                            Resection and retrieval were complete.                           The exam was otherwise without abnormality on                            direct and retroflexion views. REDUNDANT COLON Complications:            No immediate complications. Estimated blood loss:                            None. Estimated Blood Loss:     Estimated blood loss: none. Impression:               - Two 3 to 4 mm polyps in the sigmoid colon and in                            the ascending colon, removed with a cold snare.                            Resected and  retrieved.                           - The examination was otherwise normal on direct                            and retroflexion views.                           - REDUNDANT COLON Recommendation:           - Repeat colonoscopy in 5 years for surveillance.                            EXTENSIVE 2 DAY PREP, as today                           - Patient has a contact number available for                            emergencies. The signs and symptoms of potential  delayed complications were discussed with the                            patient. Return to normal activities tomorrow.                            Written discharge instructions were provided to the                            patient.                           - Resume previous diet.                           - Continue present medications.                           - Await pathology results. Docia Chuck. Henrene Pastor, MD 11/13/2022 9:38:08 AM This report has been signed electronically.

## 2022-11-13 NOTE — Progress Notes (Signed)
Pt's states no medical or surgical changes since previsit or office visit. 

## 2022-11-13 NOTE — Progress Notes (Signed)
To pacu, VSS. Report to [email or web address] 

## 2022-11-13 NOTE — Patient Instructions (Signed)
   Handout on polyps given to you today  Await pathology results on polyps removed   Repeat colonoscopy recommended in 5 years with a more extensive prep    YOU HAD AN ENDOSCOPIC PROCEDURE TODAY AT [facility]:   Refer to the procedure report that was given to you for any specific questions about what was found during the examination.  If the procedure report does not answer your questions, please call your gastroenterologist to clarify.  If you requested that your care partner not be given the details of your procedure findings, then the procedure report has been included in a sealed envelope for you to review at your convenience later.  YOU SHOULD EXPECT: Some feelings of bloating in the abdomen. Passage of more gas than usual.  Walking can help get rid of the air that was put into your GI tract during the procedure and reduce the bloating. If you had a lower endoscopy (such as a colonoscopy or flexible sigmoidoscopy) you may notice spotting of blood in your stool or on the toilet paper. If you underwent a bowel prep for your procedure, you may not have a normal bowel movement for a few days.  Please Note:  You might notice some irritation and congestion in your nose or some drainage.  This is from the oxygen used during your procedure.  There is no need for concern and it should clear up in a day or so.  SYMPTOMS TO REPORT IMMEDIATELY:  Following lower endoscopy (colonoscopy or flexible sigmoidoscopy):  Excessive amounts of blood in the stool  Significant tenderness or worsening of abdominal pains  Swelling of the abdomen that is new, acute  Fever of 100F or higher  For urgent or emergent issues, a gastroenterologist can be reached at any hour by calling [phone]. Do not use MyChart messaging for urgent concerns.    DIET:  We do recommend a small meal at first, but then you may proceed to your regular diet.  Drink plenty of fluids but you should avoid alcoholic  beverages for 24 hours.  ACTIVITY:  You should plan to take it easy for the rest of today and you should NOT DRIVE or use heavy machinery until tomorrow (because of the sedation medicines used during the test).    FOLLOW UP: Our staff will call the number listed on your records the next business day following your procedure.  We will call around 7:15- 8:00 am to check on you and address any questions or concerns that you may have regarding the information given to you following your procedure. If we do not reach you, we will leave a message.     If any biopsies were taken you will be contacted by phone or by letter within the next 1-3 weeks.  Please call us at [phone] if you have not heard about the biopsies in 3 weeks.    SIGNATURES/CONFIDENTIALITY: You and/or your care partner have signed paperwork which will be entered into your electronic medical record.  These signatures attest to the fact that that the information above on your After Visit Summary has been reviewed and is understood.  Full responsibility of the confidentiality of this discharge information lies with you and/or your care-partner.

## 2022-11-13 NOTE — Progress Notes (Signed)
Called to room to assist during endoscopic procedure.  Patient ID and intended procedure confirmed with present staff. Received instructions for my participation in the procedure from the performing physician.  

## 2022-11-13 NOTE — Progress Notes (Signed)
HISTORY OF PRESENT ILLNESS:  Bobby Horn. is a 57 y.o. male with history of adenomatous and sessile serrated polyps.  Father with colon cancer at age 4.  He is colonoscopy 2015 and 2018 with poor preps.  Last colonoscopy 2020 with adequate prep.  Now for surveillance.  REVIEW OF SYSTEMS:  All non-GI ROS negative except for  Past Medical History:  Diagnosis Date   Allergy    Bell's palsy    History   Hyperlipidemia    Hypertension    Sleep apnea    uses CPAP nightly    Past Surgical History:  Procedure Laterality Date   ADENOIDECTOMY  08/2011   [facility]--- [facility]   CLOSED REDUCTION HAND FRACTURE  12/2010   Right hand   COLONOSCOPY     KNEE SURGERY     bilat scops   POLYPECTOMY      Social History Bobby Vanallen.  reports that he has never smoked. He has never used smokeless tobacco. He reports that he does not drink alcohol and does not use drugs.  family history includes Alzheimer's disease in his maternal grandmother; Brain cancer in his sister; Breast cancer in his maternal aunt and mother; Cancer in his father; Cancer (age of onset: 87) in his maternal aunt; Cancer (age of onset: 63) in his mother; Colon cancer in his father; Colon polyps in his father; Heart disease in his maternal grandfather and maternal uncle; Hyperlipidemia in his father and maternal grandfather; Hypertension in his father, maternal grandfather, maternal grandmother, maternal uncle, maternal uncle, mother, sister, and another family member; Stroke in his father, maternal grandfather, and maternal uncle.  Allergies  Allergen Reactions   Ace Inhibitors Other (See Comments)    angioedema       PHYSICAL EXAMINATION: Vital signs: BP (!) 146/87   Pulse 69   Temp 98.2 F (36.8 C) (Temporal)   Ht '6\' 1"'$  (1.854 m)   Wt 228 lb (103.4 kg)   SpO2 99%   BMI 30.08 kg/m  General: Well-developed, well-nourished, no acute distress HEENT: Sclerae are anicteric, conjunctiva pink. Oral mucosa  intact Lungs: Clear Heart: Regular Abdomen: soft, nontender, nondistended, no obvious ascites, no peritoneal signs, normal bowel sounds. No organomegaly. Extremities: No edema Psychiatric: alert and oriented x3. Cooperative     ASSESSMENT:  History of adenomatous and sessile serrated polyps.  Family history of colon cancer.   PLAN: Surveillance colonoscopy

## 2022-11-14 ENCOUNTER — Telehealth: Payer: Self-pay | Admitting: *Deleted

## 2022-11-14 NOTE — Telephone Encounter (Signed)
No answer on  follow up call. Left message.   

## 2022-11-19 ENCOUNTER — Encounter: Payer: Self-pay | Admitting: Internal Medicine

## 2023-01-02 ENCOUNTER — Ambulatory Visit: Payer: BC Managed Care – PPO | Admitting: Family Medicine

## 2023-01-02 ENCOUNTER — Emergency Department (HOSPITAL_BASED_OUTPATIENT_CLINIC_OR_DEPARTMENT_OTHER): Payer: BC Managed Care – PPO

## 2023-01-02 ENCOUNTER — Emergency Department (HOSPITAL_BASED_OUTPATIENT_CLINIC_OR_DEPARTMENT_OTHER)
Admission: EM | Admit: 2023-01-02 | Discharge: 2023-01-02 | Disposition: A | Discharge status: Home / Self Care | Payer: BC Managed Care – PPO | Attending: Emergency Medicine | Admitting: Emergency Medicine

## 2023-01-02 ENCOUNTER — Other Ambulatory Visit (HOSPITAL_BASED_OUTPATIENT_CLINIC_OR_DEPARTMENT_OTHER): Payer: Self-pay

## 2023-01-02 ENCOUNTER — Encounter (HOSPITAL_BASED_OUTPATIENT_CLINIC_OR_DEPARTMENT_OTHER): Payer: Self-pay | Admitting: Emergency Medicine

## 2023-01-02 ENCOUNTER — Encounter: Payer: Self-pay | Admitting: Family Medicine

## 2023-01-02 DIAGNOSIS — R519 Headache, unspecified: Secondary | ICD-10-CM

## 2023-01-02 DIAGNOSIS — I10 Essential (primary) hypertension: Secondary | ICD-10-CM | POA: Diagnosis not present

## 2023-01-02 LAB — CBC WITH DIFFERENTIAL/PLATELET
Abs Immature Granulocytes: 0 10*3/uL (ref 0.00–0.07)
Basophils Absolute: 0 10*3/uL (ref 0.0–0.1)
Basophils Relative: 1 %
Eosinophils Absolute: 0.1 10*3/uL (ref 0.0–0.5)
Eosinophils Relative: 4 %
HCT: 42.2 % (ref 39.0–52.0)
Hemoglobin: 14 g/dL (ref 13.0–17.0)
Immature Granulocytes: 0 %
Lymphocytes Relative: 48 %
Lymphs Abs: 1.9 10*3/uL (ref 0.7–4.0)
MCH: 28.9 pg (ref 26.0–34.0)
MCHC: 33.2 g/dL (ref 30.0–36.0)
MCV: 87.2 fL (ref 80.0–100.0)
Monocytes Absolute: 0.3 10*3/uL (ref 0.1–1.0)
Monocytes Relative: 8 %
Neutro Abs: 1.6 10*3/uL — ABNORMAL LOW (ref 1.7–7.7)
Neutrophils Relative %: 39 %
Platelets: 253 10*3/uL (ref 150–400)
RBC: 4.84 MIL/uL (ref 4.22–5.81)
RDW: 13.4 % (ref 11.5–15.5)
WBC: 4 10*3/uL (ref 4.0–10.5)
nRBC: 0 % (ref 0.0–0.2)

## 2023-01-02 LAB — BASIC METABOLIC PANEL
Anion gap: 8 (ref 5–15)
BUN: 16 mg/dL (ref 6–20)
CO2: 26 mmol/L (ref 22–32)
Calcium: 8.8 mg/dL — ABNORMAL LOW (ref 8.9–10.3)
Chloride: 103 mmol/L (ref 98–111)
Creatinine, Ser: 1.21 mg/dL (ref 0.61–1.24)
GFR, Estimated: >60 mL/min (ref >60)
Glucose, Bld: 98 mg/dL (ref 70–99)
Potassium: 3.3 mmol/L — ABNORMAL LOW (ref 3.5–5.1)
Sodium: 137 mmol/L (ref 135–145)

## 2023-01-02 MED ORDER — AMLODIPINE BESYLATE 10 MG PO TABS
10.0000 mg | ORAL_TABLET | Freq: Every day | ORAL | 1 refills | Status: DC
  Filled 2023-01-02: qty 90, 90d supply, fill #0

## 2023-01-02 MED ORDER — METOPROLOL SUCCINATE ER 25 MG PO TB24
25.0000 mg | ORAL_TABLET | Freq: Every day | ORAL | 1 refills | Status: DC
  Filled 2023-01-02: qty 90, 90d supply, fill #0

## 2023-01-02 MED ORDER — DOXAZOSIN MESYLATE 1 MG PO TABS
1.0000 mg | ORAL_TABLET | Freq: Every day | ORAL | 1 refills | Status: DC
  Filled 2023-01-02: qty 90, 90d supply, fill #0

## 2023-01-02 MED ORDER — HYDROCHLOROTHIAZIDE 25 MG PO TABS
25.0000 mg | ORAL_TABLET | Freq: Every day | ORAL | 1 refills | Status: DC
  Filled 2023-01-02: qty 90, 90d supply, fill #0

## 2023-01-02 NOTE — Discharge Instructions (Signed)
Start your blood pressure medicine take as directed.  Make an appointment for follow-up and review blood pressure check in about a week.  Recommend extra strength Tylenol 2 tablets every 8 hours for the headache.  Return for any strokelike symptoms return for chest pain or difficulty breathing.  Today's head CT without any acute findings.

## 2023-01-02 NOTE — ED Triage Notes (Signed)
Pt sent from PCP d/t elevated BP; pt reports HA x 1 wk; out of meds for some time; PCP has sent rxs for meds

## 2023-01-02 NOTE — ED Notes (Signed)
Bedside report rec'd from previous RN

## 2023-01-02 NOTE — ED Provider Notes (Signed)
Bobby Castaneda Provider Note   CSN: [id number] Arrival date & time: 01/02/23  1225     History  Chief Complaint  Patient presents with   Headache    Bobby Castaneda. is a 57 y.o. male.  Patient is been out of his blood pressure medicine for several weeks.  Went to primary care doctor to get that filled.  He has had a mild throbbing frontal headache for about a week.  Not severe no visual changes no numbness no weakness no speech problems.  No strokelike symptoms.  Patient's blood pressure medicine was renewed by primary they sent him down for further evaluation due to the headache.  Patient is normally on Norvasc Cardura hydrochlorothiazide and Toprol XL.  So he is on several blood pressure medicines.  It was put that blood pressure here was 206/111.  Temp 98.4.  Past medical history significant for hypertension hyperlipidemia sleep apnea.  Patient is never used tobacco products.  In addition patient without any shortness of breath or chest pain.       Home Medications Prior to Admission medications   Medication Sig Start Date End Date Taking? Authorizing Provider  amLODipine (NORVASC) 10 MG tablet Take 1 tablet (10 mg total) by mouth daily. 01/02/23   Terrilyn Saver, NP  atorvastatin (LIPITOR) 40 MG tablet Take 1 tablet (40 mg total) by mouth at bedtime. 06/15/22   Ann Held, DO  doxazosin (CARDURA) 1 MG tablet Take 1 tablet (1 mg total) by mouth daily. 01/02/23   Terrilyn Saver, NP  hydrochlorothiazide (HYDRODIURIL) 25 MG tablet Take 1 tablet (25 mg total) by mouth daily. 01/02/23   Terrilyn Saver, NP  metoprolol succinate (TOPROL-XL) 25 MG 24 hr tablet Take 1 tablet (25 mg total) by mouth daily. 01/02/23 01/02/24  Terrilyn Saver, NP  potassium chloride SA (KLOR-CON M) 20 MEQ tablet Take 3 tablets (60 mEq total) by mouth daily. 11/05/22   Ann Held, DO      Allergies    Ace inhibitors    Review of Systems   Review of  Systems  Constitutional:  Negative for chills and fever.  HENT:  Negative for ear pain and sore throat.   Eyes:  Negative for photophobia, pain and visual disturbance.  Respiratory:  Negative for cough and shortness of breath.   Cardiovascular:  Negative for chest pain and palpitations.  Gastrointestinal:  Negative for abdominal pain and vomiting.  Genitourinary:  Negative for dysuria and hematuria.  Musculoskeletal:  Negative for arthralgias and back pain.  Skin:  Negative for color change and rash.  Neurological:  Positive for headaches. Negative for seizures, syncope, facial asymmetry, weakness and numbness.  All other systems reviewed and are negative.   Physical Exam Updated Vital Signs BP (!) 196/114   Pulse (!) 57   Temp 98.4 F (36.9 C) (Oral)   Resp 18   Ht 1.854 m (6\' 1" )   Wt 99.8 kg   SpO2 98%   BMI 29.03 kg/m  Physical Exam Vitals and nursing note reviewed.  Constitutional:      General: He is not in acute distress.    Appearance: Normal appearance. He is well-developed.  HENT:     Head: Normocephalic and atraumatic.  Eyes:     Extraocular Movements: Extraocular movements intact.     Conjunctiva/sclera: Conjunctivae normal.     Pupils: Pupils are equal, round, and reactive to light.  Cardiovascular:  Rate and Rhythm: Normal rate and regular rhythm.     Heart sounds: No murmur heard. Pulmonary:     Effort: Pulmonary effort is normal. No respiratory distress.     Breath sounds: Normal breath sounds.  Abdominal:     Palpations: Abdomen is soft.     Tenderness: There is no abdominal tenderness.  Musculoskeletal:        General: No swelling.     Cervical back: Normal range of motion and neck supple.  Skin:    General: Skin is warm and dry.     Capillary Refill: Capillary refill takes less than 2 seconds.  Neurological:     General: No focal deficit present.     Mental Status: He is alert and oriented to person, place, and time.     Cranial Nerves: No  cranial nerve deficit.     Sensory: No sensory deficit.     Motor: No weakness.     Coordination: Coordination normal.  Psychiatric:        Mood and Affect: Mood normal.     ED Results / Procedures / Treatments   Labs (all labs ordered are listed, but only abnormal results are displayed) Labs Reviewed  BASIC METABOLIC PANEL - Abnormal; Notable for the following components:      Result Value   Potassium 3.3 (*)    Calcium 8.8 (*)    All other components within normal limits  CBC WITH DIFFERENTIAL/PLATELET - Abnormal; Notable for the following components:   Neutro Abs 1.6 (*)    All other components within normal limits    EKG None  Radiology CT Head Wo Contrast  Result Date: 01/02/2023 CLINICAL DATA:  Headache, new onset (Age >= 51y). EXAM: CT HEAD WITHOUT CONTRAST TECHNIQUE: Contiguous axial images were obtained from the base of the skull through the vertex without intravenous contrast. RADIATION DOSE REDUCTION: This exam was performed according to the departmental dose-optimization program which includes automated exposure control, adjustment of the mA and/or kV according to patient size and/or use of iterative reconstruction technique. COMPARISON:  None Available. FINDINGS: Brain: There is no evidence of an acute infarct, intracranial hemorrhage, mass, midline shift, or extra-axial fluid collection. The ventricles and sulci are normal. Vascular: No hyperdense vessel. Skull: No fracture or suspicious osseous lesion. Sinuses/Orbits: Minimal posterior right ethmoid and left sphenoid sinus mucosal thickening. Clear mastoid air cells. Unremarkable included orbits. Other: None. IMPRESSION: Unremarkable CT appearance of the brain. Electronically Signed   By: Logan Bores M.D.   On: 01/02/2023 13:24    Procedures Procedures    Medications Ordered in ED Medications - No data to display  ED Course/ Medical Decision Making/ A&P                             Medical Decision  Making Amount and/or Complexity of Data Reviewed Labs: ordered. Radiology: ordered.   Head CT without any acute findings CBC no leukocytosis hemoglobin is 14.0.  Basic metabolic panel significant for potassium just down slightly at 3.4.  Renal function normal.  No other electrolyte abnormalities.  Patient stable for discharge home.  He has his medications here to pick up in the pharmacy so will not provide any currently.  No emergent need to bring down the blood pressure rapidly.  No evidence of head bleed on CT no evidence of any stroke no strokelike symptoms.   Final Clinical Impression(s) / ED Diagnoses Final diagnoses:  Primary hypertension  Acute intractable headache, unspecified headache type    Rx / DC Orders ED Discharge Orders     None         Fredia Sorrow, MD 01/02/23 1432

## 2023-01-02 NOTE — Progress Notes (Signed)
Acute Office Visit  Subjective:     Patient ID: Bobby Pleasants., male    DOB: [birth year], 57 y.o.   MRN: [id number]  Chief Complaint  Patient presents with   Headache    Pt says he has had a HA for 1.5 weeks. No other sxs. Taking OTC cold meds.      Patient is in today for headaches  Patient reports he has been having a headache for about 1.5-2 weeks now. Generalized with some frontal dull ache most noticeable. He has tried OTC meds thinking it may be allergies as he has had occasional mild rhinorrhea. He denies any sore throat, itchy/watery eyes, PND, cough, dyspnea, sneezing, coughing, ear pain, vision changes, asymmetrical weakness, new facial droop (chronic R side d/t Bells Palsy per patient), fevers, chills, nausea, vomiting, diarrhea, constipation, confusion, difficulty speaking or swallowing, chest pain, arm pain.  Reports he has never had a headache like this before.  States he was having trouble getting his blood pressure meds refills and is only currently taking two medications - after discussion it sounds like he is only taking potassium and Lipitor. He has been getting high BP readings at home.  BP Readings from Last 3 Encounters:  01/02/23 (!) 185/89, 192/100  11/13/22 (!) 168/88  05/25/22 126/72      All review of systems negative except what is listed in the HPI       Objective:    BP (!) 185/89   Pulse 62   Temp 98 F (36.7 C) (Oral)   Resp 18   Ht 6\' 1"  (1.854 m)   Wt 227 lb (103 kg)   SpO2 100%   BMI 29.95 kg/m    Physical Exam Vitals reviewed.  Constitutional:      Appearance: Normal appearance.  HENT:     Head: Normocephalic and atraumatic.  Cardiovascular:     Rate and Rhythm: Normal rate and regular rhythm.     Pulses: Normal pulses.     Heart sounds: Normal heart sounds.  Pulmonary:     Effort: Pulmonary effort is normal.     Breath sounds: Normal breath sounds.  Musculoskeletal:     Cervical back: Normal range of motion and  neck supple.  Skin:    General: Skin is warm and dry.  Neurological:     General: No focal deficit present.     Mental Status: He is alert and oriented to person, place, and time. Mental status is at baseline.     Cranial Nerves: No cranial nerve deficit.     Motor: No weakness.     Coordination: Coordination normal.     Gait: Gait normal.     Comments: Right facial droop (pt reports chronic d/t Bells Palsy)  Psychiatric:        Mood and Affect: Mood normal.        Behavior: Behavior normal.        Thought Content: Thought content normal.        Judgment: Judgment normal.     No results found for any visits on 01/02/23.      Assessment & Plan:   Problem List Items Addressed This Visit     Essential hypertension Headache is likely related to high blood pressure. Based on how high your readings are today (plus associated headache) I recommend you going to the Emergency Department so they can monitor you and get your blood pressure down gradually, while also ensuring nothing else is going on. You  are hesitant to go. We discussed potential risks along with symptoms to monitor for. I will go ahead and refill your blood pressure medicines for you. I want you to continue monitoring your blood pressure at home. You will need to follow-up with Dr. Etter Sjogren in about 2-3 weeks to recheck this. If you develop any new or worsening symptoms please go to the emergency department - we discussed symptoms of heart attack and stroke to monitor for.   Declined EKG, states he may just go ahead to the ED. Preferred to walk downstairs himself.   - BP goal <130/80 - monitor and log blood pressures at home - check around the same time each day in a relaxed setting - Limit salt to <2000 mg/day - Follow DASH eating plan (heart healthy diet) - limit alcohol to 2 standard drinks per day for men and 1 per day for women - avoid tobacco products - get at least 2 hours of regular aerobic exercise weekly Patient  aware of signs/symptoms requiring further/urgent evaluation.    Relevant Medications   amLODipine (NORVASC) 10 MG tablet   doxazosin (CARDURA) 1 MG tablet   hydrochlorothiazide (HYDRODIURIL) 25 MG tablet   metoprolol succinate (TOPROL-XL) 25 MG 24 hr tablet    Meds ordered this encounter  Medications   amLODipine (NORVASC) 10 MG tablet    Sig: Take 1 tablet (10 mg total) by mouth daily.    Dispense:  90 tablet    Refill:  1    Order Specific Question:   Supervising Provider    Answer:   Penni Homans A [[id number]]   doxazosin (CARDURA) 1 MG tablet    Sig: Take 1 tablet (1 mg total) by mouth daily.    Dispense:  90 tablet    Refill:  1    Order Specific Question:   Supervising Provider    Answer:   Penni Homans A [[id number]]   hydrochlorothiazide (HYDRODIURIL) 25 MG tablet    Sig: Take 1 tablet (25 mg total) by mouth daily.    Dispense:  90 tablet    Refill:  1    Please call our office to schedule a overdue appointment with Dr.Lambert before any more refills. [phone]. Thank you 2ND  attempt    Order Specific Question:   Supervising Provider    Answer:   Penni Homans A [[id number]]   metoprolol succinate (TOPROL-XL) 25 MG 24 hr tablet    Sig: TAKE 1 TABLET (25 MG TOTAL) BY MOUTH DAILY.    Dispense:  90 tablet    Refill:  1    Order Specific Question:   Supervising Provider    Answer:   Penni Homans A [[id number]]    Return in about 2 weeks (around 01/16/2023) for PCP blood pressure follow-up.  I spent 30 minutes dedicated to the care of this patient on the date of this encounter to include pre-visit chart review of prior notes and results, face-to-face time with the patient performing a medically appropriate exam, counseling/education regarding HTN, and post-visit documentation.    Terrilyn Saver, NP

## 2023-01-02 NOTE — Patient Instructions (Addendum)
Headache is likely related to high blood pressure. Based on how your readings are today I recommend you going to the Emergency Department so they can monitor you and get your blood pressure down gradually, while also ensuring nothing else is going on. You requested not to go to the ED. We discussed potential risks along with symptoms to monitor for. I will go ahead and refill your blood pressure medicines for you. I want you to continue monitoring your blood pressure at home. You will need to follow-up with Dr. Etter Sjogren in about 2-3 weeks to recheck this. If you develop any new or worsening symptoms please go to the emergency department - we discussed symptoms of heart attack and stroke to monitor for.  - BP goal <130/80 - monitor and log blood pressures at home - check around the same time each day in a relaxed setting - Limit salt to <2000 mg/day - Follow DASH eating plan (heart healthy diet) - limit alcohol to 2 standard drinks per day for men and 1 per day for women - avoid tobacco products - get at least 2 hours of regular aerobic exercise weekly Patient aware of signs/symptoms requiring further/urgent evaluation.

## 2023-01-02 NOTE — ED Notes (Addendum)
Spoke with MD about DC with ongoing HTN and BP of 202/121, states he is comfortable with pt DC since [facility] and pt will take them upon DC. Pt has hx with these meds per MD.

## 2023-01-18 ENCOUNTER — Other Ambulatory Visit: Payer: Self-pay | Admitting: Family Medicine

## 2023-01-18 ENCOUNTER — Other Ambulatory Visit (HOSPITAL_COMMUNITY): Payer: Self-pay

## 2023-01-18 DIAGNOSIS — J069 Acute upper respiratory infection, unspecified: Secondary | ICD-10-CM

## 2023-01-21 ENCOUNTER — Other Ambulatory Visit (HOSPITAL_COMMUNITY): Payer: Self-pay

## 2023-01-22 ENCOUNTER — Other Ambulatory Visit (HOSPITAL_COMMUNITY): Payer: Self-pay

## 2023-01-22 ENCOUNTER — Other Ambulatory Visit: Payer: Self-pay | Admitting: Family Medicine

## 2023-01-22 ENCOUNTER — Telehealth: Payer: Self-pay | Admitting: Family Medicine

## 2023-01-22 DIAGNOSIS — N529 Male erectile dysfunction, unspecified: Secondary | ICD-10-CM

## 2023-01-22 DIAGNOSIS — J069 Acute upper respiratory infection, unspecified: Secondary | ICD-10-CM

## 2023-01-22 MED ORDER — SILDENAFIL CITRATE 100 MG PO TABS
50.0000 mg | ORAL_TABLET | Freq: Every day | ORAL | 11 refills | Status: DC | PRN
  Filled 2023-01-22: qty 10, 10d supply, fill #0
  Filled 2023-03-26 – 2023-03-27 (×2): qty 10, 10d supply, fill #1
  Filled 2023-07-09: qty 10, 10d supply, fill #2
  Filled 2023-10-10: qty 10, 10d supply, fill #3

## 2023-01-22 MED ORDER — FLUTICASONE PROPIONATE 50 MCG/ACT NA SUSP
2.0000 | Freq: Every day | NASAL | 6 refills | Status: AC
Start: 2023-01-22 — End: ?
  Filled 2023-01-22: qty 16, 30d supply, fill #0

## 2023-01-22 NOTE — Telephone Encounter (Signed)
Please advise? Neither medications are not on med list. Okay to refill?

## 2023-01-22 NOTE — Telephone Encounter (Signed)
Sildenafil citrate and fluticasone propionate

## 2023-01-22 NOTE — Telephone Encounter (Signed)
Noted! Thank you

## 2023-01-22 NOTE — Telephone Encounter (Signed)
Pt called to find out why both of his prescriptions were denied. Message only says refill not appropriate. Pt said he does not take the medications every day but he does still use both. Please review and call pt to advise the reason.

## 2023-01-31 ENCOUNTER — Ambulatory Visit: Payer: BC Managed Care – PPO | Admitting: Family Medicine

## 2023-02-04 ENCOUNTER — Other Ambulatory Visit: Payer: Self-pay | Admitting: Family Medicine

## 2023-02-05 ENCOUNTER — Other Ambulatory Visit (HOSPITAL_COMMUNITY): Payer: Self-pay

## 2023-02-05 ENCOUNTER — Ambulatory Visit (INDEPENDENT_AMBULATORY_CARE_PROVIDER_SITE_OTHER): Payer: BC Managed Care – PPO | Admitting: Family Medicine

## 2023-02-05 ENCOUNTER — Other Ambulatory Visit: Payer: Self-pay

## 2023-02-05 ENCOUNTER — Encounter: Payer: Self-pay | Admitting: Family Medicine

## 2023-02-05 VITALS — BP 128/80 | HR 60 | Temp 98.4°F | Resp 18 | Ht 73.0 in | Wt 226.6 lb

## 2023-02-05 DIAGNOSIS — M62838 Other muscle spasm: Secondary | ICD-10-CM | POA: Diagnosis not present

## 2023-02-05 DIAGNOSIS — E785 Hyperlipidemia, unspecified: Secondary | ICD-10-CM

## 2023-02-05 DIAGNOSIS — I10 Essential (primary) hypertension: Secondary | ICD-10-CM

## 2023-02-05 MED ORDER — ATORVASTATIN CALCIUM 40 MG PO TABS
40.0000 mg | ORAL_TABLET | Freq: Every day | ORAL | 1 refills | Status: DC
  Filled 2023-02-05: qty 90, 90d supply, fill #0
  Filled 2023-05-07: qty 90, 90d supply, fill #1

## 2023-02-05 MED ORDER — AMLODIPINE BESYLATE 10 MG PO TABS
10.0000 mg | ORAL_TABLET | Freq: Every day | ORAL | 1 refills | Status: DC
Start: 2023-02-05 — End: 2024-01-13
  Filled 2023-02-05 – 2023-03-27 (×2): qty 90, 90d supply, fill #0
  Filled 2023-07-09: qty 90, 90d supply, fill #1

## 2023-02-05 MED ORDER — CYCLOBENZAPRINE HCL 10 MG PO TABS
10.0000 mg | ORAL_TABLET | Freq: Three times a day (TID) | ORAL | 0 refills | Status: AC | PRN
Start: 2023-02-05 — End: ?
  Filled 2023-02-05: qty 30, 10d supply, fill #0

## 2023-02-05 MED ORDER — DOXAZOSIN MESYLATE 1 MG PO TABS
1.0000 mg | ORAL_TABLET | Freq: Every day | ORAL | 1 refills | Status: DC
Start: 2023-02-05 — End: 2023-08-06
  Filled 2023-02-05: qty 90, 90d supply, fill #0

## 2023-02-05 MED ORDER — METOPROLOL SUCCINATE ER 25 MG PO TB24
25.0000 mg | ORAL_TABLET | Freq: Every day | ORAL | 1 refills | Status: DC
Start: 2023-02-05 — End: 2023-10-10
  Filled 2023-02-05 – 2023-03-27 (×2): qty 90, 90d supply, fill #0
  Filled 2023-04-01 – 2023-07-09 (×2): qty 90, 90d supply, fill #1

## 2023-02-05 MED ORDER — HYDROCHLOROTHIAZIDE 25 MG PO TABS
25.0000 mg | ORAL_TABLET | Freq: Every day | ORAL | 1 refills | Status: DC
Start: 2023-02-05 — End: 2023-10-14
  Filled 2023-02-05 – 2023-04-01 (×2): qty 90, 90d supply, fill #0
  Filled 2023-07-09: qty 90, 90d supply, fill #1

## 2023-02-05 NOTE — Progress Notes (Signed)
Subjective:   By signing my Castaneda below, I, Bobby Castaneda, attest that this documentation has been prepared under the direction and in the presence of Bobby Schultz, DO. 02/05/2023   Patient ID: Bobby Castaneda., male    DOB: [birth year], 57 Bobby.o.   MRN: [id number]  Chief Complaint  Patient presents Castaneda   ED follow up    HTN and headache, pt states checking blood pressures at home.     HPI Patient is in today for a ED follow up visit.   He was admitted to the ED on 01/02/2023 following his primary care visit. He was admitted to primary hypertension and headache. While in the ED, Head CT without any acute findings CBC no leukocytosis hemoglobin is 14.0. Basic metabolic panel significant for potassium just down slightly at 3.4. Renal function normal. No other electrolyte abnormalities. He reports not having his regularly medication for the past couple days prior to his admittance due to running out and not having a refill order. He continues taking 10 mg amlodipine daily PO, 25 mg hydrochlorothiazide daily PO, 25 mg metoprolol succinate daily PO and reports no new issues while taking it. He also continues taking 60 meq potassium chloride SA tablets daily PO. He has no headaches and reports finding improvement since he resumed his blood pressure medication.  He also complains of a pulled muscle on his right upper back. He reports pulling it at work and finds it bothers him throughout the day.  BP Readings from Last 3 Encounters:  02/05/23 128/80  01/02/23 (!) 202/121  01/02/23 (!) 185/89   Pulse Readings from Last 3 Encounters:  02/05/23 60  01/02/23 (!) 52  01/02/23 62   Lab Results  Component Value Date   K 3.3 (L) 01/02/2023    Past Medical History:  Diagnosis Date   Allergy    Bell's palsy    History   Hyperlipidemia    Hypertension    Sleep apnea    uses CPAP nightly    Past Surgical History:  Procedure Laterality Date   ADENOIDECTOMY  08/2011   Emeline Darling--- [facility]  ent   CLOSED REDUCTION HAND FRACTURE  12/2010   Right hand   COLONOSCOPY     KNEE SURGERY     bilat scops   POLYPECTOMY      Family History  Problem Relation Age of Onset   Hypertension Mother    Cancer Mother 14       breast   Breast cancer Mother    Colon polyps Father    Hypertension Father    Hyperlipidemia Father    Stroke Father    Cancer Father        prostate   Colon cancer Father        dx'd in his 15's   Hypertension Sister    Brain cancer Sister    Cancer Maternal Aunt 37       breast   Breast cancer Maternal Aunt    Heart disease Maternal Uncle        mi   Hypertension Maternal Uncle    Stroke Maternal Uncle    Hypertension Maternal Uncle    Hypertension Maternal Grandmother    Alzheimer's disease Maternal Grandmother    Heart disease Maternal Grandfather        mi   Hypertension Maternal Grandfather    Hyperlipidemia Maternal Grandfather    Stroke Maternal Grandfather    Hypertension Other    Rectal cancer Neg Hx  Stomach cancer Neg Hx    Esophageal cancer Neg Hx     Social History   Socioeconomic History   Marital status: Married    Bobby Castaneda: Not on file   Number of children: Bobby   Years of education: Not on file   Highest education level: Bachelor's degree (e.g., BA, AB, BS)  Occupational History   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, engineering  Tobacco Use   Smoking status: Never   Smokeless tobacco: Never  Vaping Use   Vaping Use: Never used  Substance and Sexual Activity   Alcohol use: No    Alcohol/week: 0.0 standard drinks of alcohol   Drug use: No   Sexual activity: Yes    Partners: Male  Other Topics Concern   Not on file  Social History Narrative   Regular Exercise- qd    Social Determinants of Health   Financial Resource Strain: Low Risk  (01/01/2023)   Overall Financial Resource Strain (CARDIA)    Difficulty of Paying Living Expenses: Not hard at all  Food  Insecurity: No Food Insecurity (01/01/2023)   Hunger Vital Sign    Worried About Running Out of Food in the Last Year: Never true    Ran Out of Food in the Last Year: Never true  Transportation Needs: No Transportation Needs (01/01/2023)   PRAPARE - Administrator, Civil Service (Medical): No    Lack of Transportation (Non-Medical): No  Physical Activity: Sufficiently Active (01/01/2023)   Exercise Vital Sign    Days of Exercise per Week: 6 days    Minutes of Exercise per Session: 70 min  Stress: No Stress Concern Present (01/01/2023)   [employer] of Occupational Health - Occupational Stress Questionnaire    Feeling of Stress : Not at all  Social Connections: Socially Integrated (01/01/2023)   Social Connection and Isolation Panel [NHANES]    Frequency of Communication Castaneda Friends and Family: More than three times a week    Frequency of Social Gatherings Castaneda Friends and Family: Once a week    Attends Religious Services: More than 4 times per year    Active Member of [employer] or Organizations: Yes    Attends Engineer, structural: More than 4 times per year    Marital Status: Married  Catering manager Violence: Not on file    Outpatient Medications Prior to Visit  Medication Sig Dispense Refill   atorvastatin (LIPITOR) 40 MG tablet Take 1 tablet (40 mg total) by mouth at bedtime. 90 tablet 1   fluticasone (FLONASE) 50 MCG/ACT nasal spray Place 2 sprays into both nostrils daily. 16 g 6   potassium chloride SA (KLOR-CON M) 20 MEQ tablet Take 3 tablets (60 mEq total) by mouth daily. 270 tablet 1   sildenafil (VIAGRA) 100 MG tablet Take 1/2-1 tablet (50-100 mg total) by mouth daily as needed for erectile dysfuntion. 10 tablet 11   amLODipine (NORVASC) 10 MG tablet Take 1 tablet (10 mg total) by mouth daily. 90 tablet 1   doxazosin (CARDURA) 1 MG tablet Take 1 tablet (1 mg total) by mouth daily. 90 tablet 1   hydrochlorothiazide (HYDRODIURIL) 25 MG tablet Take 1 tablet  (25 mg total) by mouth daily. 90 tablet 1   metoprolol succinate (TOPROL-XL) 25 MG 24 hr tablet Take 1 tablet (25 mg total) by mouth daily. 90 tablet 1   Facility-Administered Medications Prior to Visit  Medication Dose Route Frequency Provider Last  Rate Last Admin   0.9 %  sodium chloride infusion  500 mL Intravenous Continuous Hilarie Fredrickson, MD       0.9 %  sodium chloride infusion  500 mL Intravenous Once Hilarie Fredrickson, MD        Allergies  Allergen Reactions   Ace Inhibitors Other (See Comments)    angioedema    Review of Systems  Constitutional:  Negative for fever and malaise/fatigue.  HENT:  Negative for congestion.   Eyes:  Negative for blurred vision.  Respiratory:  Negative for cough and shortness of breath.   Cardiovascular:  Negative for chest pain, palpitations and leg swelling.  Gastrointestinal:  Negative for vomiting.  Musculoskeletal:  Positive for back pain (right upper back pain).  Skin:  Negative for rash.  Neurological:  Negative for loss of consciousness and headaches.       Objective:    Physical Exam Vitals and nursing note reviewed.  Constitutional:      General: He is not in acute distress.    Appearance: Normal appearance. He is not ill-appearing.  HENT:     Head: Normocephalic and atraumatic.     Right Ear: External ear normal.     Left Ear: External ear normal.  Eyes:     Extraocular Movements: Extraocular movements intact.     Pupils: Pupils are equal, round, and reactive to light.  Cardiovascular:     Rate and Rhythm: Normal rate and regular rhythm.     Heart sounds: Normal heart sounds. No murmur heard.    No gallop.  Pulmonary:     Effort: Pulmonary effort is normal. No respiratory distress.     Breath sounds: Normal breath sounds. No wheezing or rales.  Skin:    General: Skin is warm and dry.  Neurological:     Mental Status: He is alert and oriented to person, place, and time.  Psychiatric:        Judgment: Judgment normal.      BP 128/80 (BP Location: Left Arm, Patient Position: Sitting, Cuff Size: Large)   Pulse 60   Temp 98.4 F (36.9 C) (Oral)   Resp 18   Ht 6\' 1"  (1.854 m)   Wt 226 lb 9.6 oz (102.8 kg)   SpO2 96%   BMI 29.90 kg/m  Wt Readings from Last 3 Encounters:  02/05/23 226 lb 9.6 oz (102.8 kg)  01/02/23 220 lb (99.8 kg)  01/02/23 227 lb (103 kg)       Assessment & Plan:  Essential hypertension Assessment & Plan: Well controlled, no changes to meds. Encouraged heart healthy diet such as the DASH diet and exercise as tolerated.    Orders: -     amLODIPine Besylate; Take 1 tablet (10 mg total) by mouth daily.  Dispense: 90 tablet; Refill: 1 -     Doxazosin Mesylate; Take 1 tablet (1 mg total) by mouth daily.  Dispense: 90 tablet; Refill: 1 -     hydroCHLOROthiazide; Take 1 tablet (25 mg total) by mouth daily.  Dispense: 90 tablet; Refill: 1 -     Metoprolol Succinate ER; Take 1 tablet (25 mg total) by mouth daily.  Dispense: 90 tablet; Refill: 1 -     CBC Castaneda Differential/Platelet -     Comprehensive metabolic panel -     Lipid panel  Muscle spasm -     Cyclobenzaprine HCl; Take 1 tablet (10 mg total) by mouth 3 (three) times daily as needed for muscle spasms.  Dispense: 30 tablet; Refill: 0  Hyperlipidemia, unspecified hyperlipidemia type Assessment & Plan: Encourage heart healthy diet such as MIND or DASH diet, increase exercise, avoid trans fats, simple carbohydrates and processed foods, consider a krill or fish or flaxseed oil cap daily.       IDonato Schultz, DO, personally preformed the services described in this documentation.  All medical record entries made by the scribe were at my direction and in my presence.  I have reviewed the chart and discharge instructions (if applicable) and agree that the record reflects my personal performance and is accurate and complete. 02/05/2023   I,Bobby Castaneda,acting as a scribe for Bobby Schultz, DO.,have documented all  relevant documentation on the behalf of Bobby Schultz, DO,as directed by  Bobby Schultz, DO while in the presence of Bobby Schultz, DO.   Bobby Schultz, DO

## 2023-02-05 NOTE — Progress Notes (Signed)
Established Patient Office Visit  Subjective   Patient ID: Bobby Bray., male    DOB: 01/07/1966  Age: 57 y.o. MRN: [id number]  Chief Complaint  Patient presents with  . ED follow up    HTN and headache, pt states checking blood pressures at home.     HPI  Patient Active Problem List   Diagnosis Date Noted  . Stress fracture of right tibia 05/07/2022  . Abscess and cellulitis of gluteal region 01/16/2022  . Chest pain 06/23/2020  . Erectile dysfunction 10/26/2019  . Preventative health care 12/23/2016  . Angioedema 07/02/2016  . Chronic kidney disease (CKD), stage I 02/08/2016  . Injury of left elbow 07/06/2015  . Allergic rhinitis 03/10/2015  . Left thigh pain 02/10/2015  . Obesity (BMI 30-39.9) 02/23/2014  . 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 Bell's palsy 05/19/2013  . OSA (obstructive sleep apnea) 11/13/2011  . Enlarged thyroid 12/29/2010  . FRACTURE, RIGHT HAND 12/15/2010  . SINUSITIS - ACUTE-NOS 09/04/2010  . Acute pain of right knee 12/05/2009  . SHOULDER PAIN, RIGHT 10/25/2009  . CELLULITIS AND ABSCESS OF TRUNK 06/22/2008  . Hyperlipidemia 03/22/2008  . ABSCESS, SKIN 06/18/2007  . Essential hypertension 02/25/2007   Past Medical History:  Diagnosis Date  . Allergy   . Bell's palsy    History  . Hyperlipidemia   . Hypertension   . Sleep apnea    uses CPAP nightly   Past Surgical History:  Procedure Laterality Date  . ADENOIDECTOMY  08/2011   [facility]ore--- [facility] ent  . CLOSED REDUCTION HAND FRACTURE  12/2010   Right hand  . COLONOSCOPY    . KNEE SURGERY     bilat scops  . POLYPECTOMY     Social History   Tobacco Use  . Smoking status: Never  . Smokeless tobacco: Never  Vaping Use  . Vaping Use: Never used  Substance Use Topics  . Alcohol use: No    Alcohol/week: 0.0 standard drinks of alcohol  . Drug use: No   Social History   Socioeconomic History  . Marital status: Married    Spouse name: Not on file  . Number of children: Y  . Years of  education: Not on file  . Highest education level: Bachelor's degree (e.g., BA, AB, BS)  Occupational History  . Occupation: combine Neurosurgeon: Chief Executive Officer   . Occupation: Geologist, engineering  Tobacco Use  . Smoking status: Never  . Smokeless tobacco: Never  Vaping Use  . Vaping Use: Never used  Substance and Sexual Activity  . Alcohol use: No    Alcohol/week: 0.0 standard drinks of alcohol  . Drug use: No  . Sexual activity: Yes    Partners: Male  Other Topics Concern  . Not on file  Social History Narrative   Regular Exercise- qd    Social Determinants of Health   Financial Resource Strain: Low Risk  (01/01/2023)   Overall Financial Resource Strain (CARDIA)   . Difficulty of Paying Living Expenses: Not hard at all  Food Insecurity: No Food Insecurity (01/01/2023)   Hunger Vital Sign   . Worried About Programme researcher, broadcasting/film/video in the Last Year: Never true   . Ran Out of Food in the Last Year: Never true  Transportation Needs: No Transportation Needs (01/01/2023)   PRAPARE - Transportation   . Lack of Transportation (Medical): No   . Lack of Transportation (Non-Medical): No  Physical Activity: Sufficiently Active (01/01/2023)   Exercise Vital Sign   .  Days of Exercise per Week: 6 days   . Minutes of Exercise per Session: 70 min  Stress: No Stress Concern Present (01/01/2023)   [employer] of Occupational Health - Occupational Stress Questionnaire   . Feeling of Stress : Not at all  Social Connections: Socially Integrated (01/01/2023)   Social Connection and Isolation Panel [NHANES]   . Frequency of Communication with Friends and Family: More than three times a week   . Frequency of Social Gatherings with Friends and Family: Once a week   . Attends Religious Services: More than 4 times per year   . Active Member of Clubs or Organizations: Yes   . Attends Banker Meetings: More than 4 times per year   . Marital Status: Married  Catering manager  Violence: Not on file   Family Status  Relation Name Status  . Mother  Alive  . Father  Deceased  . Sister  Deceased  . Mat Aunt  Deceased at age 59  . Mat Uncle  Deceased  . Mat Uncle  Deceased  . MGM  Deceased  . MGF  Deceased at age 48  . Other  (Not Specified)  . Neg Hx  (Not Specified)   Family History  Problem Relation Age of Onset  . Hypertension Mother   . Cancer Mother 22       breast  . Breast cancer Mother   . Colon polyps Father   . Hypertension Father   . Hyperlipidemia Father   . Stroke Father   . Cancer Father        prostate  . Colon cancer Father        dx'd in his 9's  . Hypertension Sister   . Brain cancer Sister   . Cancer Maternal Aunt 37       breast  . Breast cancer Maternal Aunt   . Heart disease Maternal Uncle        mi  . Hypertension Maternal Uncle   . Stroke Maternal Uncle   . Hypertension Maternal Uncle   . Hypertension Maternal Grandmother   . Alzheimer's disease Maternal Grandmother   . Heart disease Maternal Grandfather        mi  . Hypertension Maternal Grandfather   . Hyperlipidemia Maternal Grandfather   . Stroke Maternal Grandfather   . Hypertension Other   . Rectal cancer Neg Hx   . Stomach cancer Neg Hx   . Esophageal cancer Neg Hx    Allergies  Allergen Reactions  . Ace Inhibitors Other (See Comments)    angioedema      Review of Systems  Constitutional:  Negative for fever and malaise/fatigue.  HENT:  Negative for congestion.   Eyes:  Negative for blurred vision.  Respiratory:  Negative for shortness of breath.   Cardiovascular:  Negative for chest pain, palpitations and leg swelling.  Gastrointestinal:  Negative for abdominal pain, blood in stool and nausea.  Genitourinary:  Negative for dysuria and frequency.  Musculoskeletal:  Negative for falls.  Skin:  Negative for rash.  Neurological:  Negative for dizziness, loss of consciousness and headaches.  Endo/Heme/Allergies:  Negative for environmental  allergies.  Psychiatric/Behavioral:  Negative for depression. The patient is not nervous/anxious.       Objective:     BP 128/80 (BP Location: Left Arm, Patient Position: Sitting, Cuff Size: Large)   Pulse 60   Temp 98.4 F (36.9 C) (Oral)   Resp 18   Ht 6\' 1"  (1.854 m)  Wt 226 lb 9.6 oz (102.8 kg)   SpO2 96%   BMI 29.90 kg/m  BP Readings from Last 3 Encounters:  02/05/23 128/80  01/02/23 (!) 202/121  01/02/23 (!) 185/89   Wt Readings from Last 3 Encounters:  02/05/23 226 lb 9.6 oz (102.8 kg)  01/02/23 220 lb (99.8 kg)  01/02/23 227 lb (103 kg)   SpO2 Readings from Last 3 Encounters:  02/05/23 96%  01/02/23 98%  01/02/23 100%      Physical Exam Vitals and nursing note reviewed.  Constitutional:      Appearance: He is well-developed.  HENT:     Head: Normocephalic and atraumatic.  Eyes:     Pupils: Pupils are equal, round, and reactive to light.  Neck:     Thyroid: No thyromegaly.  Cardiovascular:     Rate and Rhythm: Normal rate and regular rhythm.     Heart sounds: No murmur heard. Pulmonary:     Effort: Pulmonary effort is normal. No respiratory distress.     Breath sounds: Normal breath sounds. No wheezing or rales.  Chest:     Chest wall: No tenderness.  Musculoskeletal:     Cervical back: Normal range of motion and neck supple.     Thoracic back: Spasms and tenderness present. Normal range of motion.     Right hip: No tenderness. Normal range of motion. Normal strength.     Left hip: No tenderness. Normal range of motion. Normal strength.     Right foot: No swelling or bony tenderness.     Left foot: No swelling or bony tenderness.  Skin:    General: Skin is warm and dry.  Neurological:     Mental Status: He is alert and oriented to person, place, and time.  Psychiatric:        Behavior: Behavior normal.        Thought Content: Thought content normal.        Judgment: Judgment normal.     No results found for any visits on 02/05/23.  Last  CBC Lab Results  Component Value Date   WBC 4.0 01/02/2023   HGB 14.0 01/02/2023   HCT 42.2 01/02/2023   MCV 87.2 01/02/2023   MCH 28.9 01/02/2023   RDW 13.4 01/02/2023   PLT 253 01/02/2023   Last metabolic panel Lab Results  Component Value Date   GLUCOSE 98 01/02/2023   NA 137 01/02/2023   K 3.3 (L) 01/02/2023   CL 103 01/02/2023   CO2 26 01/02/2023   BUN 16 01/02/2023   CREATININE 1.21 01/02/2023   GFRNONAA >60 01/02/2023   CALCIUM 8.8 (L) 01/02/2023   PHOS 3.3 02/09/2016   PROT 7.8 05/25/2022   ALBUMIN 4.3 05/25/2022   BILITOT 0.6 05/25/2022   ALKPHOS 83 05/25/2022   AST 18 05/25/2022   ALT 15 05/25/2022   ANIONGAP 8 01/02/2023   Last lipids Lab Results  Component Value Date   CHOL 152 05/25/2022   HDL 39.40 05/25/2022   LDLCALC 78 05/25/2022   TRIG 172.0 (H) 05/25/2022   CHOLHDL 4 05/25/2022   Last hemoglobin A1c No results found for: "HGBA1C" Last thyroid functions Lab Results  Component Value Date   TSH 1.60 05/25/2022   Last vitamin D No results found for: "25OHVITD2", "25OHVITD3", "VD25OH" Last vitamin B12 and Folate No results found for: "VITAMINB12", "FOLATE"    The 10-year ASCVD risk score (Arnett DK, et al., 2019) is: 12.3%    Assessment & Plan:   Problem List  Items Addressed This Visit       Unprioritized   Essential hypertension   Relevant Medications   amLODipine (NORVASC) 10 MG tablet   doxazosin (CARDURA) 1 MG tablet   hydrochlorothiazide (HYDRODIURIL) 25 MG tablet   metoprolol succinate (TOPROL-XL) 25 MG 24 hr tablet   Other Relevant Orders   CBC with Differential/Platelet   Comprehensive metabolic panel   Lipid panel   Other Visit Diagnoses     Muscle spasm    -  Primary   Relevant Medications   cyclobenzaprine (FLEXERIL) 10 MG tablet       No follow-ups on file.    [name], DO

## 2023-02-05 NOTE — Assessment & Plan Note (Signed)
Well controlled, no changes to meds. Encouraged heart healthy diet such as the DASH diet and exercise as tolerated.  °

## 2023-02-05 NOTE — Assessment & Plan Note (Signed)
Encourage heart healthy diet such as MIND or DASH diet, increase exercise, avoid trans fats, simple carbohydrates and processed foods, consider a krill or fish or flaxseed oil cap daily.  °

## 2023-02-06 LAB — COMPREHENSIVE METABOLIC PANEL
ALT: 18 U/L (ref 0–53)
AST: 24 U/L (ref 0–37)
Albumin: 4.1 g/dL (ref 3.5–5.2)
Alkaline Phosphatase: 78 U/L (ref 39–117)
BUN: 17 mg/dL (ref 6–23)
CO2: 30 mEq/L (ref 19–32)
Calcium: 9.2 mg/dL (ref 8.4–10.5)
Chloride: 101 mEq/L (ref 96–112)
Creatinine, Ser: 1.44 mg/dL (ref 0.40–1.50)
GFR: 53.68 mL/min — ABNORMAL LOW (ref >60.00)
Glucose, Bld: 118 mg/dL — ABNORMAL HIGH (ref 70–99)
Potassium: 3.2 mEq/L — ABNORMAL LOW (ref 3.5–5.1)
Sodium: 140 mEq/L (ref 135–145)
Total Bilirubin: 0.4 mg/dL (ref 0.2–1.2)
Total Protein: 7.5 g/dL (ref 6.0–8.3)

## 2023-02-06 LAB — LIPID PANEL
Cholesterol: 134 mg/dL (ref 0–200)
HDL: 42.9 mg/dL (ref >39.00)
LDL Cholesterol: 59 mg/dL (ref 0–99)
NonHDL: 91.25
Total CHOL/HDL Ratio: 3
Triglycerides: 159 mg/dL — ABNORMAL HIGH (ref 0.0–149.0)
VLDL: 31.8 mg/dL (ref 0.0–40.0)

## 2023-02-06 LAB — CBC WITH DIFFERENTIAL/PLATELET
Basophils Absolute: 0.1 10*3/uL (ref 0.0–0.1)
Basophils Relative: 1.1 % (ref 0.0–3.0)
Eosinophils Absolute: 0.2 10*3/uL (ref 0.0–0.7)
Eosinophils Relative: 3.8 % (ref 0.0–5.0)
HCT: 38.6 % — ABNORMAL LOW (ref 39.0–52.0)
Hemoglobin: 12.9 g/dL — ABNORMAL LOW (ref 13.0–17.0)
Lymphocytes Relative: 47.4 % — ABNORMAL HIGH (ref 12.0–46.0)
Lymphs Abs: 2.2 10*3/uL (ref 0.7–4.0)
MCHC: 33.5 g/dL (ref 30.0–36.0)
MCV: 87.1 fl (ref 78.0–100.0)
Monocytes Absolute: 0.4 10*3/uL (ref 0.1–1.0)
Monocytes Relative: 8.9 % (ref 3.0–12.0)
Neutro Abs: 1.8 10*3/uL (ref 1.4–7.7)
Neutrophils Relative %: 38.8 % — ABNORMAL LOW (ref 43.0–77.0)
Platelets: 227 10*3/uL (ref 150.0–400.0)
RBC: 4.43 Mil/uL (ref 4.22–5.81)
RDW: 13.8 % (ref 11.5–15.5)
WBC: 4.7 10*3/uL (ref 4.0–10.5)

## 2023-03-27 ENCOUNTER — Other Ambulatory Visit (HOSPITAL_COMMUNITY): Payer: Self-pay

## 2023-04-01 ENCOUNTER — Other Ambulatory Visit (HOSPITAL_COMMUNITY): Payer: Self-pay

## 2023-04-04 ENCOUNTER — Other Ambulatory Visit (HOSPITAL_COMMUNITY): Payer: Self-pay

## 2023-05-23 IMAGING — DX DG KNEE COMPLETE 4+V*R*
4 series · 4 of 4 positions shown · non-contrast
Comparison: November 07, 2020

CLINICAL DATA: Chronic right knee pain.

EXAM:
RIGHT KNEE - COMPLETE 4+ VIEW

[knee ap]
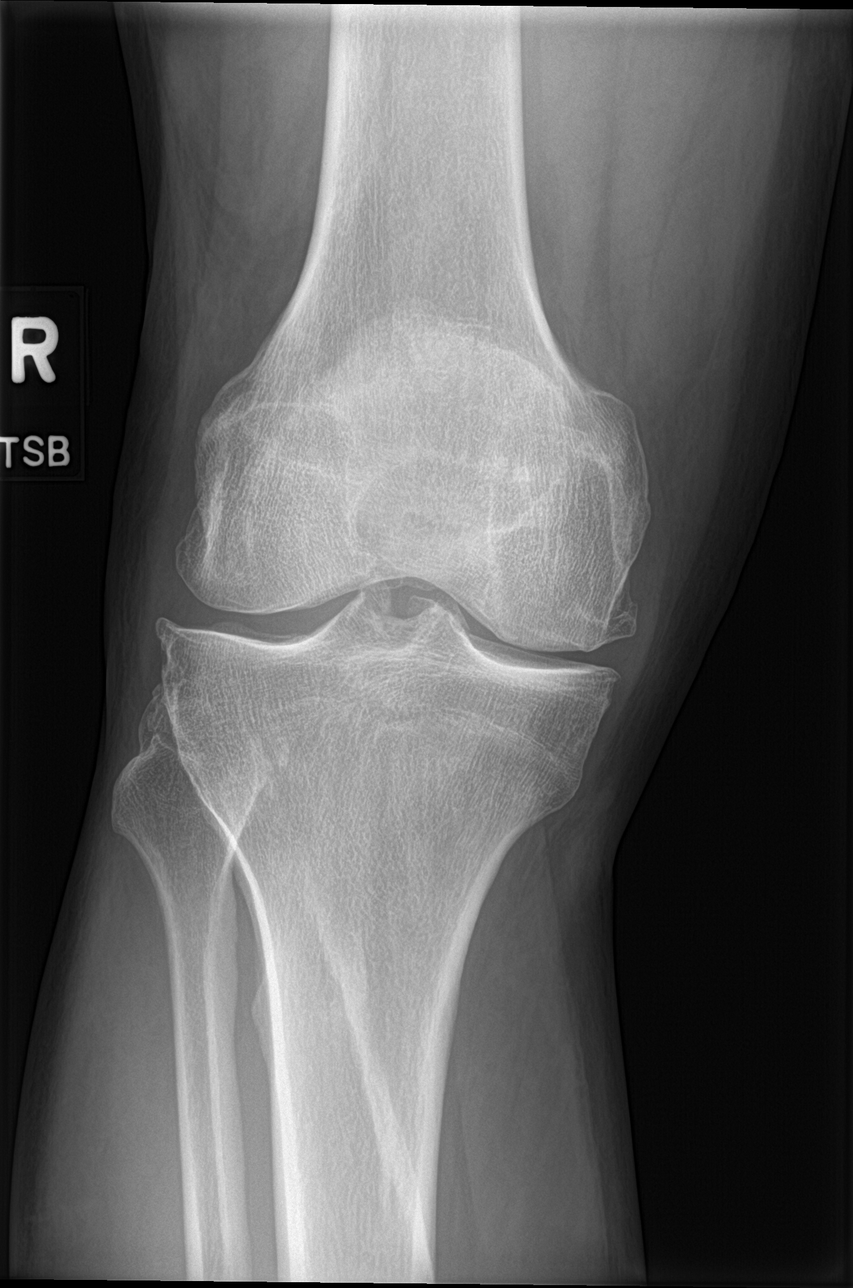

[knee lat]
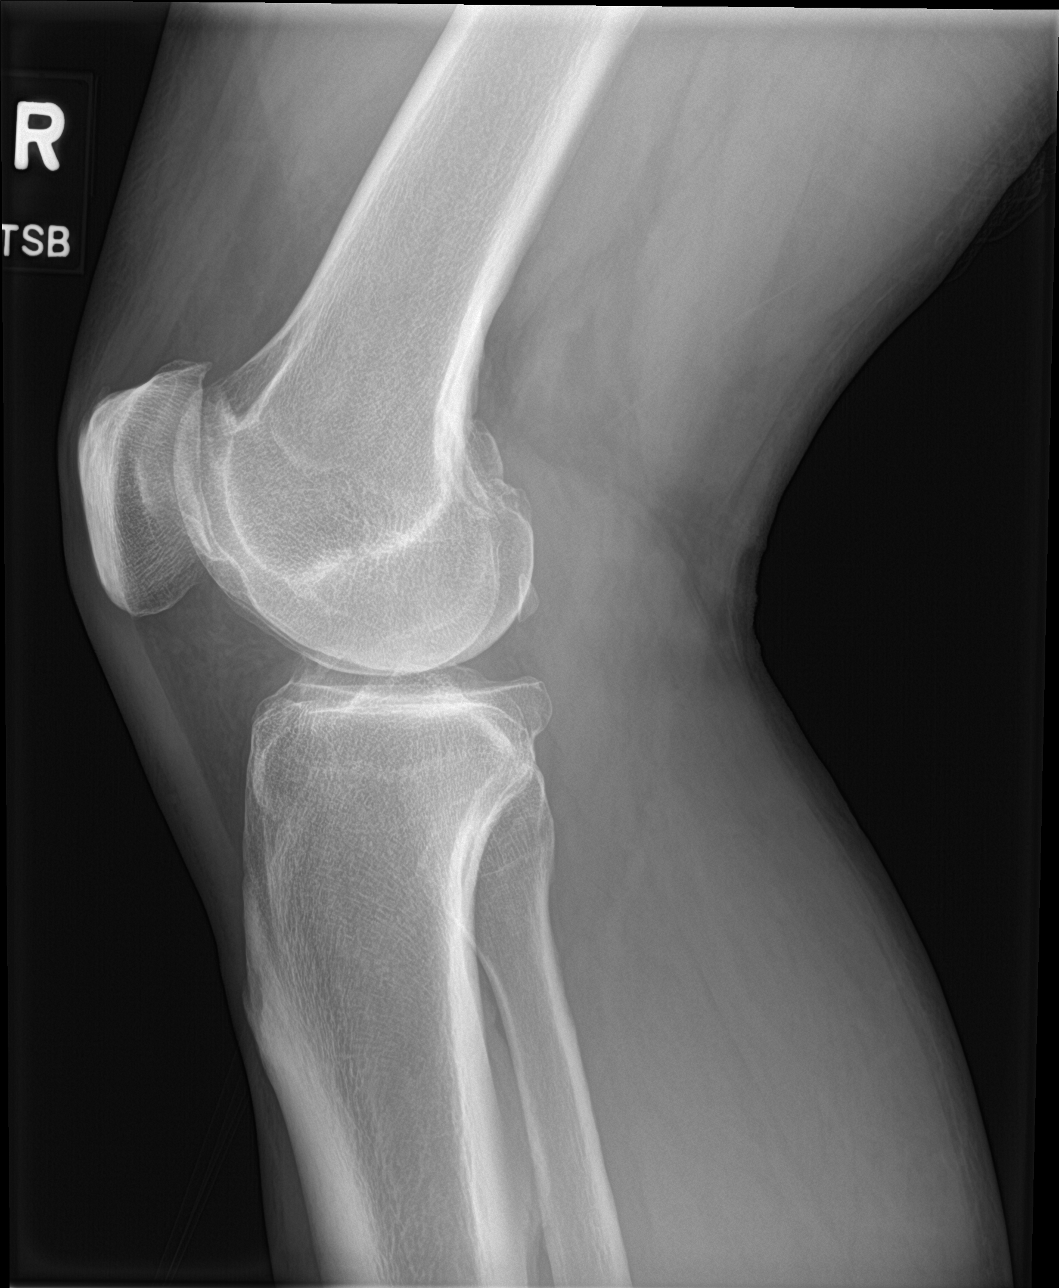

[knee obl (1 of 2)]
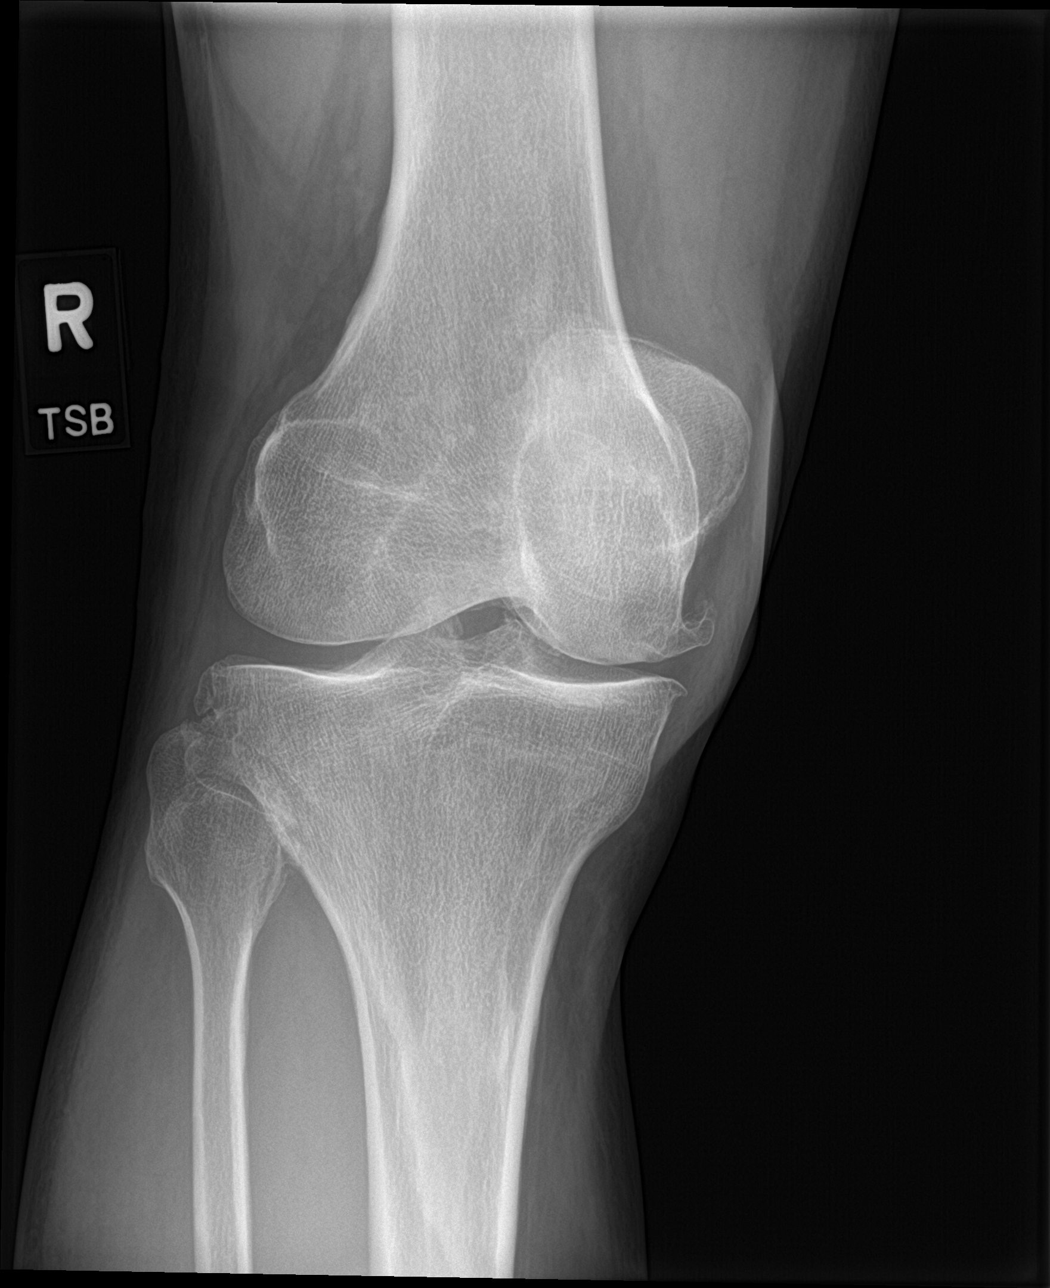

[knee obl (2 of 2)]
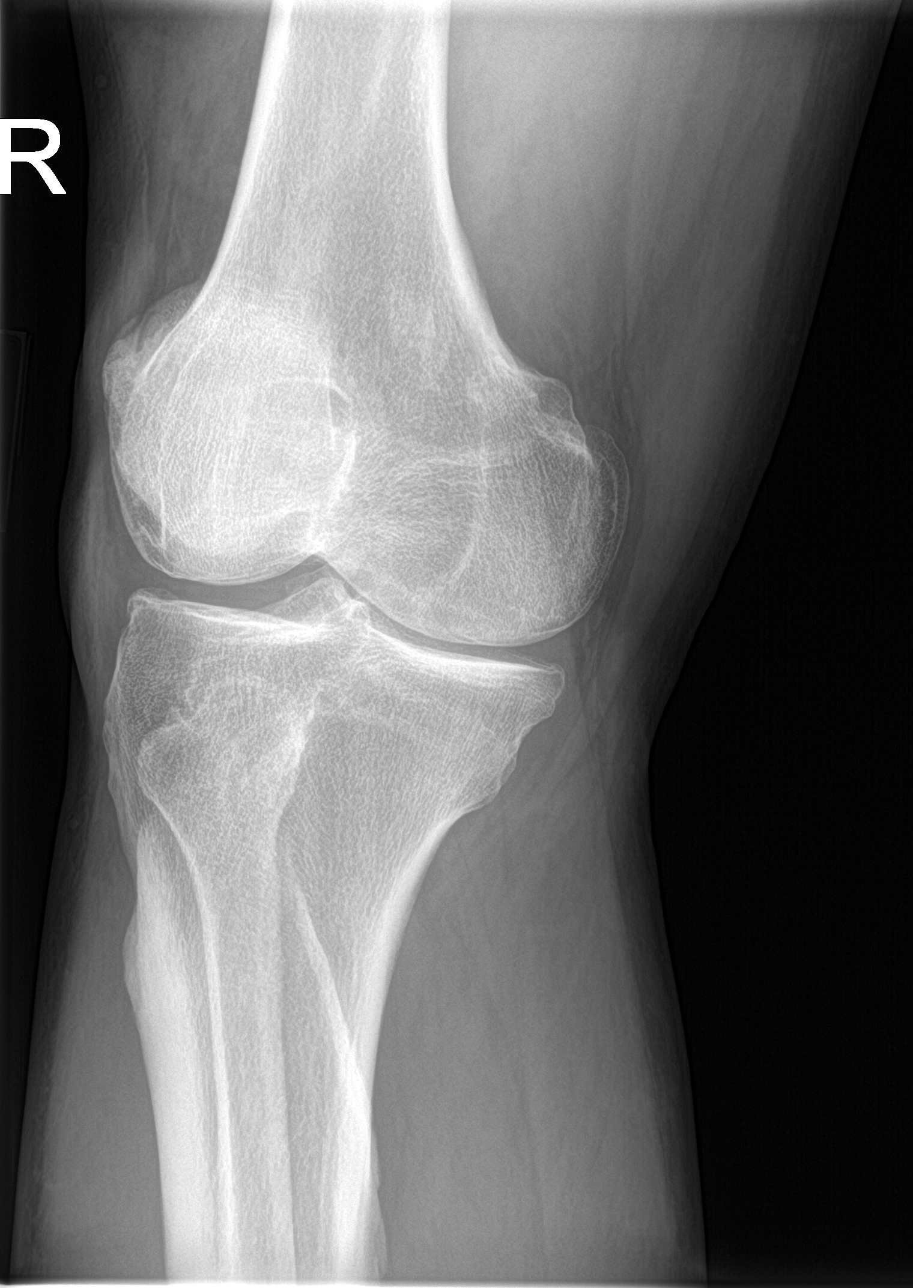

[4 of 4 positions shown; findings below may reference images not displayed]

FINDINGS: No evidence of an acute fracture, dislocation, or joint effusion.
Marginal osteophytes are seen involving the distal right femur
proximal right tibia. There is moderate severity medial tibiofemoral
compartment space narrowing. Mild to moderate severity
patellofemoral and lateral tibiofemoral compartment space narrowing
is also seen. Soft tissues are unremarkable.
IMPRESSION: Stable degenerative changes without an acute osseous abnormality.

## 2023-05-24 ENCOUNTER — Other Ambulatory Visit: Payer: Self-pay

## 2023-07-09 ENCOUNTER — Other Ambulatory Visit (HOSPITAL_COMMUNITY): Payer: Self-pay

## 2023-08-06 ENCOUNTER — Ambulatory Visit (INDEPENDENT_AMBULATORY_CARE_PROVIDER_SITE_OTHER): Payer: BC Managed Care – PPO | Admitting: Family Medicine

## 2023-08-06 ENCOUNTER — Other Ambulatory Visit (HOSPITAL_COMMUNITY): Payer: Self-pay

## 2023-08-06 ENCOUNTER — Encounter: Payer: Self-pay | Admitting: Family Medicine

## 2023-08-06 VITALS — BP 120/90 | HR 59 | Temp 98.4°F | Resp 18 | Ht 73.0 in | Wt 230.6 lb

## 2023-08-06 DIAGNOSIS — I10 Essential (primary) hypertension: Secondary | ICD-10-CM | POA: Diagnosis not present

## 2023-08-06 DIAGNOSIS — E785 Hyperlipidemia, unspecified: Secondary | ICD-10-CM

## 2023-08-06 DIAGNOSIS — Z23 Encounter for immunization: Secondary | ICD-10-CM | POA: Diagnosis not present

## 2023-08-06 MED ORDER — POTASSIUM CHLORIDE CRYS ER 20 MEQ PO TBCR
60.0000 meq | EXTENDED_RELEASE_TABLET | Freq: Every day | ORAL | 1 refills | Status: DC
Start: 2023-08-06 — End: 2024-04-20
  Filled 2023-08-06: qty 270, 90d supply, fill #0
  Filled 2023-11-13: qty 270, 90d supply, fill #1

## 2023-08-06 NOTE — Patient Instructions (Signed)

## 2023-08-06 NOTE — Progress Notes (Signed)
Established Patient Office Visit  Subjective   Patient ID: Bobby Soltani., male    DOB: 28-Oct-1965  Age: 57 y.o. MRN: [id number]  Chief Complaint  Patient presents with   Hypertension   Follow-up    HPI Discussed the use of AI scribe software for clinical note transcription with the patient, who gave verbal consent to proceed.  History of Present Illness   The patient, with a history of hypertension, presents with elevated blood pressure despite being on four antihypertensive medications: doxazosin, amlodipine, hydrochlorothiazide, and metoprolol. He reports some confusion about when to take each medication, but generally takes three of them at night due to drowsiness and one with food.  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 This job can be stressful, which may contribute to his hypertension. Discussed the use of AI scribe software for clinical note transcription with the patient, who gave verbal consent to proceed.  History of Present Illness   The patient, with a history of hypertension, presents with elevated blood pressure despite being on four antihypertensive medications: doxazosin, amlodipine, hydrochlorothiazide, and metoprolol. He reports some confusion about when to take each medication, but generally takes three of them at night due to drowsiness and one with food.  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 This job can be stressful, which may contribute to his hypertension.          Patient Active Problem List   Diagnosis Date Noted   Stress fracture of right tibia 05/07/2022   Abscess and cellulitis of gluteal region 01/16/2022   Chest pain 06/23/2020   Erectile dysfunction  10/26/2019   Preventative health care 12/23/2016   Angioedema 07/02/2016   Chronic kidney disease (CKD), stage I 02/08/2016   Injury of left elbow 07/06/2015   Allergic rhinitis 03/10/2015   Left thigh pain 02/10/2015   Obesity (BMI 30-39.9) 02/23/2014   Facial palsy 01/01/2014   Bell's palsy 05/19/2013   OSA (obstructive sleep apnea) 11/13/2011   Enlarged thyroid 12/29/2010   Closed fracture of metacarpal bone 12/15/2010   SINUSITIS - ACUTE-NOS 09/04/2010   Acute pain of right knee 12/05/2009   SHOULDER PAIN, RIGHT 10/25/2009   CELLULITIS AND ABSCESS OF TRUNK 06/22/2008   Hyperlipidemia 03/22/2008   ABSCESS, SKIN 06/18/2007   Essential hypertension 02/25/2007   Past Medical History:  Diagnosis Date   Allergy    Bell's palsy    History   Hyperlipidemia    Hypertension    Sleep apnea    uses CPAP nightly   Past Surgical History:  Procedure Laterality Date   ADENOIDECTOMY  08/2011   Emeline Darling--- [facility]   CLOSED REDUCTION HAND FRACTURE  12/2010   Right hand   COLONOSCOPY     KNEE SURGERY     bilat scops   POLYPECTOMY     Social History   Tobacco Use   Smoking status: Never   Smokeless tobacco: Never  Vaping Use   Vaping status: Never Used  Substance Use Topics   Alcohol use: No    Alcohol/week: 0.0 standard drinks of alcohol   Drug use: No   Social History   Socioeconomic History   Marital status: Married    Spouse name: Not on file   Number of  children: Y   Years of education: Not on file   Highest education level: Bachelor's degree (e.g., BA, AB, BS)  Occupational History   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, engineering  Tobacco Use   Smoking status: Never   Smokeless tobacco: Never  Vaping Use   Vaping status: Never Used  Substance and Sexual Activity   Alcohol use: No    Alcohol/week: 0.0 standard drinks of alcohol   Drug use: No   Sexual activity: Yes    Partners: Male  Other Topics Concern   Not  on file  Social History Narrative   Regular Exercise- qd    Social Determinants of Health   Financial Resource Strain: Low Risk  (08/05/2023)   Overall Financial Resource Strain (CARDIA)    Difficulty of Paying Living Expenses: Not very hard  Food Insecurity: Food Insecurity Present (08/05/2023)   Hunger Vital Sign    Worried About Running Out of Food in the Last Year: Never true    Ran Out of Food in the Last Year: Sometimes true  Transportation Needs: No Transportation Needs (08/05/2023)   PRAPARE - Administrator, Civil Service (Medical): No    Lack of Transportation (Non-Medical): No  Physical Activity: Sufficiently Active (08/05/2023)   Exercise Vital Sign    Days of Exercise per Week: 4 days    Minutes of Exercise per Session: 60 min  Stress: No Stress Concern Present (08/05/2023)   [employer] of Occupational Health - Occupational Stress Questionnaire    Feeling of Stress : Not at all  Social Connections: Socially Integrated (08/05/2023)   Social Connection and Isolation Panel [NHANES]    Frequency of Communication with Friends and Family: More than three times a week    Frequency of Social Gatherings with Friends and Family: Twice a week    Attends Religious Services: More than 4 times per year    Active Member of [employer] or Organizations: Yes    Attends Engineer, structural: More than 4 times per year    Marital Status: Married  Catering manager Violence: Not on file   Family Status  Relation Name Status   Mother  Alive   Father  Deceased   Sister  Deceased   Mat Aunt  Deceased at age 28   Mat Uncle  Deceased   Mat Uncle  Deceased   MGM  Deceased   MGF  Deceased at age 78   Other  (Not Specified)   Neg Hx  (Not Specified)  No partnership data on file   Family History  Problem Relation Age of Onset   Hypertension Mother    Cancer Mother 62       breast   Breast cancer Mother    Colon polyps Father    Hypertension Father     Hyperlipidemia Father    Stroke Father    Cancer Father        prostate   Colon cancer Father        dx'd in his 15's   Hypertension Sister    Brain cancer Sister    Cancer Maternal Aunt 37       breast   Breast cancer Maternal Aunt    Heart disease Maternal Uncle        mi   Hypertension Maternal Uncle    Stroke Maternal Uncle    Hypertension Maternal Uncle    Hypertension Maternal Grandmother  Alzheimer's disease Maternal Grandmother    Heart disease Maternal Grandfather        mi   Hypertension Maternal Grandfather    Hyperlipidemia Maternal Grandfather    Stroke Maternal Grandfather    Hypertension Other    Rectal cancer Neg Hx    Stomach cancer Neg Hx    Esophageal cancer Neg Hx    Allergies  Allergen Reactions   Ace Inhibitors Other (See Comments)    angioedema      Review of Systems  Constitutional:  Negative for chills, fever and malaise/fatigue.  HENT:  Negative for congestion and hearing loss.   Eyes:  Negative for blurred vision and discharge.  Respiratory:  Negative for cough, sputum production and shortness of breath.   Cardiovascular:  Negative for chest pain, palpitations and leg swelling.  Gastrointestinal:  Negative for abdominal pain, blood in stool, constipation, diarrhea, heartburn, nausea and vomiting.  Genitourinary:  Negative for dysuria, frequency, hematuria and urgency.  Musculoskeletal:  Negative for back pain, falls and myalgias.  Skin:  Negative for rash.  Neurological:  Negative for dizziness, sensory change, loss of consciousness, weakness and headaches.  Endo/Heme/Allergies:  Negative for environmental allergies. Does not bruise/bleed easily.  Psychiatric/Behavioral:  Negative for depression and suicidal ideas. The patient is not nervous/anxious and does not have insomnia.       Objective:     BP (!) 120/90   Pulse (!) 59   Temp 98.4 F (36.9 C) (Oral)   Resp 18   Ht 6\' 1"  (1.854 m)   Wt 230 lb 9.6 oz (104.6 kg)   SpO2 96%    BMI 30.42 kg/m  BP Readings from Last 3 Encounters:  08/06/23 (!) 120/90  02/05/23 128/80  01/02/23 (!) 202/121   Wt Readings from Last 3 Encounters:  08/06/23 230 lb 9.6 oz (104.6 kg)  02/05/23 226 lb 9.6 oz (102.8 kg)  01/02/23 220 lb (99.8 kg)   SpO2 Readings from Last 3 Encounters:  08/06/23 96%  02/05/23 96%  01/02/23 98%      Physical Exam Vitals and nursing note reviewed.  Constitutional:      General: He is not in acute distress.    Appearance: Normal appearance. He is well-developed.  HENT:     Head: Normocephalic and atraumatic.  Eyes:     General: No scleral icterus.       Right eye: No discharge.        Left eye: No discharge.  Cardiovascular:     Rate and Rhythm: Normal rate and regular rhythm.     Heart sounds: No murmur heard. Pulmonary:     Effort: Pulmonary effort is normal. No respiratory distress.     Breath sounds: Normal breath sounds.  Musculoskeletal:        General: Normal range of motion.     Cervical back: Normal range of motion and neck supple.     Right lower leg: No edema.     Left lower leg: No edema.  Skin:    General: Skin is warm and dry.  Neurological:     Mental Status: He is alert and oriented to person, place, and time.  Psychiatric:        Mood and Affect: Mood normal.        Behavior: Behavior normal.        Thought Content: Thought content normal.        Judgment: Judgment normal.      No results found for any visits  on 08/06/23.  Last CBC Lab Results  Component Value Date   WBC 4.7 02/05/2023   HGB 12.9 (L) 02/05/2023   HCT 38.6 (L) 02/05/2023   MCV 87.1 02/05/2023   MCH 28.9 01/02/2023   RDW 13.8 02/05/2023   PLT 227.0 02/05/2023   Last metabolic panel Lab Results  Component Value Date   GLUCOSE 118 (H) 02/05/2023   NA 140 02/05/2023   K 3.2 (L) 02/05/2023   CL 101 02/05/2023   CO2 30 02/05/2023   BUN 17 02/05/2023   CREATININE 1.44 02/05/2023   GFR 53.68 (L) 02/05/2023   CALCIUM 9.2 02/05/2023    PHOS 3.3 02/09/2016   PROT 7.5 02/05/2023   ALBUMIN 4.1 02/05/2023   BILITOT 0.4 02/05/2023   ALKPHOS 78 02/05/2023   AST 24 02/05/2023   ALT 18 02/05/2023   ANIONGAP 8 01/02/2023   Last lipids Lab Results  Component Value Date   CHOL 134 02/05/2023   HDL 42.90 02/05/2023   LDLCALC 59 02/05/2023   TRIG 159.0 (H) 02/05/2023   CHOLHDL 3 02/05/2023   Last hemoglobin A1c No results found for: "HGBA1C" Last thyroid functions Lab Results  Component Value Date   TSH 1.60 05/25/2022   Last vitamin D No results found for: "25OHVITD2", "25OHVITD3", "VD25OH" Last vitamin B12 and Folate No results found for: "VITAMINB12", "FOLATE"    The 10-year ASCVD risk score (Arnett DK, et al., 2019) is: 10.3%    Assessment & Plan:   Problem List Items Addressed This Visit       Unprioritized   Hyperlipidemia - Primary   Relevant Orders   CBC with Differential/Platelet   Comprehensive metabolic panel   Lipid panel   TSH   Other Visit Diagnoses     Primary hypertension       Relevant Medications   potassium chloride SA (KLOR-CON M) 20 MEQ tablet   Other Relevant Orders   CBC with Differential/Platelet   Comprehensive metabolic panel   Lipid panel   TSH   Need for influenza vaccination       Relevant Orders   Flu vaccine trivalent PF, 6mos and older(Flulaval,Afluria,Fluarix,Fluzone) (Completed)     Assessment and Plan    Hypertension Elevated blood pressure despite being on four antihypertensive medications (Doxazosin, Amlodipine, Hydrochlorothiazide, Metoprolol). Patient reported confusion about timing of medication administration, specifically taking Hydrochlorothiazide at night leading to nocturia. -Clarified medication administration:Doxazosin in the morning, Hydrochlorothiazide in the morning to avoid nocturia, Amlodipine and Metoprolol at night. -Recheck blood pressure later in the visit before considering any medication adjustments.        No follow-ups on file.     [name], DO

## 2023-08-07 LAB — CBC WITH DIFFERENTIAL/PLATELET
Basophils Absolute: 0 10*3/uL (ref 0.0–0.1)
Basophils Relative: 0.8 % (ref 0.0–3.0)
Eosinophils Absolute: 0.2 10*3/uL (ref 0.0–0.7)
Eosinophils Relative: 4.4 % (ref 0.0–5.0)
HCT: 39.7 % (ref 39.0–52.0)
Hemoglobin: 13.1 g/dL (ref 13.0–17.0)
Lymphocytes Relative: 53.2 % — ABNORMAL HIGH (ref 12.0–46.0)
Lymphs Abs: 2.7 10*3/uL (ref 0.7–4.0)
MCHC: 32.9 g/dL (ref 30.0–36.0)
MCV: 88.6 fL (ref 78.0–100.0)
Monocytes Absolute: 0.5 10*3/uL (ref 0.1–1.0)
Monocytes Relative: 9.4 % (ref 3.0–12.0)
Neutro Abs: 1.7 10*3/uL (ref 1.4–7.7)
Neutrophils Relative %: 32.2 % — ABNORMAL LOW (ref 43.0–77.0)
Platelets: 253 10*3/uL (ref 150.0–400.0)
RBC: 4.49 Mil/uL (ref 4.22–5.81)
RDW: 13.2 % (ref 11.5–15.5)
WBC: 5.2 10*3/uL (ref 4.0–10.5)

## 2023-08-07 LAB — COMPREHENSIVE METABOLIC PANEL
ALT: 18 U/L (ref 0–53)
AST: 26 U/L (ref 0–37)
Albumin: 4.2 g/dL (ref 3.5–5.2)
Alkaline Phosphatase: 80 U/L (ref 39–117)
BUN: 18 mg/dL (ref 6–23)
CO2: 32 meq/L (ref 19–32)
Calcium: 9.5 mg/dL (ref 8.4–10.5)
Chloride: 101 meq/L (ref 96–112)
Creatinine, Ser: 1.46 mg/dL (ref 0.40–1.50)
GFR: 52.62 mL/min — ABNORMAL LOW (ref >60.00)
Glucose, Bld: 86 mg/dL (ref 70–99)
Potassium: 3.7 meq/L (ref 3.5–5.1)
Sodium: 140 meq/L (ref 135–145)
Total Bilirubin: 0.5 mg/dL (ref 0.2–1.2)
Total Protein: 7.6 g/dL (ref 6.0–8.3)

## 2023-08-07 LAB — LIPID PANEL
Cholesterol: 143 mg/dL (ref 0–200)
HDL: 43 mg/dL (ref >39.00)
LDL Cholesterol: 82 mg/dL (ref 0–99)
NonHDL: 100.1
Total CHOL/HDL Ratio: 3
Triglycerides: 92 mg/dL (ref 0.0–149.0)
VLDL: 18.4 mg/dL (ref 0.0–40.0)

## 2023-08-07 LAB — TSH: TSH: 2.13 u[IU]/mL (ref 0.35–5.50)

## 2023-08-08 LAB — LAB REPORT - SCANNED: Creatinine, POC: 134.2 mg/dL

## 2023-09-03 ENCOUNTER — Other Ambulatory Visit (HOSPITAL_COMMUNITY): Payer: Self-pay

## 2023-09-03 ENCOUNTER — Other Ambulatory Visit (HOSPITAL_BASED_OUTPATIENT_CLINIC_OR_DEPARTMENT_OTHER): Payer: Self-pay

## 2023-09-03 ENCOUNTER — Other Ambulatory Visit: Payer: Self-pay | Admitting: Family Medicine

## 2023-09-03 MED ORDER — ATORVASTATIN CALCIUM 40 MG PO TABS
40.0000 mg | ORAL_TABLET | Freq: Every day | ORAL | 1 refills | Status: DC
  Filled 2023-09-03 (×2): qty 90, 90d supply, fill #0
  Filled 2023-12-22: qty 90, 90d supply, fill #1
  Filled 2023-12-25: qty 90, 90d supply, fill #0

## 2023-10-10 ENCOUNTER — Other Ambulatory Visit (HOSPITAL_COMMUNITY): Payer: Self-pay

## 2023-10-10 ENCOUNTER — Other Ambulatory Visit: Payer: Self-pay | Admitting: Family Medicine

## 2023-10-10 DIAGNOSIS — I10 Essential (primary) hypertension: Secondary | ICD-10-CM

## 2023-10-10 MED ORDER — METOPROLOL SUCCINATE ER 25 MG PO TB24
25.0000 mg | ORAL_TABLET | Freq: Every day | ORAL | 1 refills | Status: DC
  Filled 2023-10-10: qty 90, 90d supply, fill #0
  Filled 2024-01-13: qty 90, 90d supply, fill #1

## 2023-10-11 ENCOUNTER — Other Ambulatory Visit: Payer: Self-pay

## 2023-10-14 ENCOUNTER — Other Ambulatory Visit (HOSPITAL_COMMUNITY): Payer: Self-pay

## 2023-10-14 ENCOUNTER — Other Ambulatory Visit: Payer: Self-pay | Admitting: Family Medicine

## 2023-10-14 DIAGNOSIS — I10 Essential (primary) hypertension: Secondary | ICD-10-CM

## 2023-10-14 MED ORDER — HYDROCHLOROTHIAZIDE 25 MG PO TABS
25.0000 mg | ORAL_TABLET | Freq: Every day | ORAL | 1 refills | Status: DC
  Filled 2023-10-14: qty 90, 90d supply, fill #0
  Filled 2024-01-13: qty 90, 90d supply, fill #1

## 2023-11-26 ENCOUNTER — Ambulatory Visit: Payer: 59 | Admitting: Family Medicine

## 2023-11-28 ENCOUNTER — Ambulatory Visit: Payer: 59 | Admitting: Family Medicine

## 2023-12-02 ENCOUNTER — Ambulatory Visit (INDEPENDENT_AMBULATORY_CARE_PROVIDER_SITE_OTHER): Payer: 59 | Admitting: Family Medicine

## 2023-12-02 ENCOUNTER — Encounter: Payer: Self-pay | Admitting: Family Medicine

## 2023-12-02 VITALS — BP 152/98 | HR 51 | Temp 98.2°F | Resp 18 | Ht 73.0 in | Wt 236.2 lb

## 2023-12-02 DIAGNOSIS — G4733 Obstructive sleep apnea (adult) (pediatric): Secondary | ICD-10-CM

## 2023-12-02 NOTE — Progress Notes (Signed)
 Established Patient Office Visit  Subjective   Patient ID: Bobby Misner., male    DOB: January 05, 1966  Age: 58 y.o. MRN: [id number]  Chief Complaint  Patient presents with   Obstructive Sleep Apnea    HPI Discussed the use of AI scribe software for clinical note transcription with the patient, who gave verbal consent to proceed.  History of Present Illness   Bobby Pruden. is a 58 year old male who presents for CPAP supply management and follow-up.  He is managing his CPAP supplies due to a change in insurance coverage, which now requires a face-to-face consultation to obtain supplies. He has been under the care of pulmonary specialists for CPAP management.  He last saw a pulmonary specialist, Dr. Vassie Loll, approximately three to four years ago. At that time, he contacted the pulmonary department because the CPAP pressure settings felt too high. The settings were adjusted to a pressure of 14 cm H2O, and he was asked to update on his comfort level with the new settings. He has not had a follow-up appointment since then.  He received a call from CPAP supply customer service, possibly from [place], regarding the coordination of his CPAP supplies. He was informed that he would contact the relevant parties to ensure the necessary documentation was sent, but he is unsure if the required information has been received by the current provider.      Patient Active Problem List   Diagnosis Date Noted   Stress fracture of right tibia 05/07/2022   Abscess and cellulitis of gluteal region 01/16/2022   Chest pain 06/23/2020   Erectile dysfunction 10/26/2019   Preventative health care 12/23/2016   Angioedema 07/02/2016   Chronic kidney disease (CKD), stage I 02/08/2016   Injury of left elbow 07/06/2015   Allergic rhinitis 03/10/2015   Left thigh pain 02/10/2015   Obesity (BMI 30-39.9) 02/23/2014   Facial palsy 01/01/2014   Bell's palsy 05/19/2013   OSA (obstructive sleep apnea) 11/13/2011    Enlarged thyroid 12/29/2010   Closed fracture of metacarpal bone 12/15/2010   SINUSITIS - ACUTE-NOS 09/04/2010   Acute pain of right knee 12/05/2009   SHOULDER PAIN, RIGHT 10/25/2009   CELLULITIS AND ABSCESS OF TRUNK 06/22/2008   Hyperlipidemia 03/22/2008   ABSCESS, SKIN 06/18/2007   Essential hypertension 02/25/2007   Past Medical History:  Diagnosis Date   Allergy    Bell's palsy    History   Hyperlipidemia    Hypertension    Sleep apnea    uses CPAP nightly   Past Surgical History:  Procedure Laterality Date   ADENOIDECTOMY  08/2011   Emeline Darling--- [facility]   CLOSED REDUCTION HAND FRACTURE  12/2010   Right hand   COLONOSCOPY     KNEE SURGERY     bilat scops   POLYPECTOMY     Social History   Tobacco Use   Smoking status: Never   Smokeless tobacco: Never  Vaping Use   Vaping status: Never Used  Substance Use Topics   Alcohol use: No    Alcohol/week: 0.0 standard drinks of alcohol   Drug use: No   Social History   Socioeconomic History   Marital status: Married    Spouse name: Not on file   Number of children: Y   Years of education: Not on file   Highest education level: Bachelor's degree (e.g., BA, AB, BS)  Occupational History   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, engineering  Tobacco Use   Smoking status: Never   Smokeless tobacco: Never  Vaping Use   Vaping status: Never Used  Substance and Sexual Activity   Alcohol use: No    Alcohol/week: 0.0 standard drinks of alcohol   Drug use: No   Sexual activity: Yes    Partners: Male  Other Topics Concern   Not on file  Social History Narrative   Regular Exercise- qd    Social Drivers of Health   Financial Resource Strain: Low Risk  (11/25/2023)   Overall Financial Resource Strain (CARDIA)    Difficulty of Paying Living Expenses: Not very hard  Food Insecurity: No Food Insecurity (11/25/2023)   Hunger Vital Sign    Worried About Running Out of Food in  the Last Year: Never true    Ran Out of Food in the Last Year: Never true  Transportation Needs: No Transportation Needs (11/25/2023)   PRAPARE - Administrator, Civil Service (Medical): No    Lack of Transportation (Non-Medical): No  Physical Activity: Sufficiently Active (11/25/2023)   Exercise Vital Sign    Days of Exercise per Week: 6 days    Minutes of Exercise per Session: 60 min  Stress: No Stress Concern Present (11/25/2023)   [employer] of Occupational Health - Occupational Stress Questionnaire    Feeling of Stress : Not at all  Social Connections: Moderately Integrated (11/25/2023)   Social Connection and Isolation Panel [NHANES]    Frequency of Communication with Friends and Family: More than three times a week    Frequency of Social Gatherings with Friends and Family: Once a week    Attends Religious Services: More than 4 times per year    Active Member of [employer] or Organizations: Yes    Attends Banker Meetings: More than 4 times per year    Marital Status: Widowed  Catering manager Violence: Not on file   Family Status  Relation Name Status   Mother  Alive   Father  Deceased   Sister  Deceased   Mat Aunt  Deceased at age 7   Mat Uncle  Deceased   Mat Uncle  Deceased   MGM  Deceased   MGF  Deceased at age 3   Other  (Not Specified)   Neg Hx  (Not Specified)  No partnership data on file   Family History  Problem Relation Age of Onset   Hypertension Mother    Cancer Mother 36       breast   Breast cancer Mother    Colon polyps Father    Hypertension Father    Hyperlipidemia Father    Stroke Father    Cancer Father        prostate   Colon cancer Father        dx'd in his 78's   Hypertension Sister    Brain cancer Sister    Cancer Maternal Aunt 37       breast   Breast cancer Maternal Aunt    Heart disease Maternal Uncle        mi   Hypertension Maternal Uncle    Stroke Maternal Uncle    Hypertension Maternal Uncle     Hypertension Maternal Grandmother    Alzheimer's disease Maternal Grandmother    Heart disease Maternal Grandfather        mi   Hypertension Maternal Grandfather    Hyperlipidemia Maternal Grandfather    Stroke Maternal Grandfather    Hypertension Other  Rectal cancer Neg Hx    Stomach cancer Neg Hx    Esophageal cancer Neg Hx    Allergies  Allergen Reactions   Ace Inhibitors Other (See Comments)    angioedema      Review of Systems  Constitutional:  Negative for fever and malaise/fatigue.  HENT:  Negative for congestion.   Eyes:  Negative for blurred vision.  Respiratory:  Negative for cough and shortness of breath.   Cardiovascular:  Negative for chest pain, palpitations and leg swelling.  Gastrointestinal:  Negative for abdominal pain, blood in stool, nausea and vomiting.  Genitourinary:  Negative for dysuria and frequency.  Musculoskeletal:  Negative for back pain and falls.  Skin:  Negative for rash.  Neurological:  Negative for dizziness, loss of consciousness and headaches.  Endo/Heme/Allergies:  Negative for environmental allergies.  Psychiatric/Behavioral:  Negative for depression. The patient is not nervous/anxious.       Objective:     BP (!) 152/98 (BP Location: Left Arm, Patient Position: Sitting, Cuff Size: Large)   Pulse (!) 51   Temp 98.2 F (36.8 C) (Oral)   Resp 18   Ht 6\' 1"  (1.854 m)   Wt 236 lb 3.2 oz (107.1 kg)   SpO2 99%   BMI 31.16 kg/m  BP Readings from Last 3 Encounters:  12/02/23 (!) 152/98  08/06/23 (!) 120/90  02/05/23 128/80   Wt Readings from Last 3 Encounters:  12/02/23 236 lb 3.2 oz (107.1 kg)  08/06/23 230 lb 9.6 oz (104.6 kg)  02/05/23 226 lb 9.6 oz (102.8 kg)   SpO2 Readings from Last 3 Encounters:  12/02/23 99%  08/06/23 96%  02/05/23 96%      Physical Exam Vitals and nursing note reviewed.  Neurological:     General: No focal deficit present.     Mental Status: He is oriented to person, place, and time.       No results found for any visits on 12/02/23.  Last CBC Lab Results  Component Value Date   WBC 5.2 08/06/2023   HGB 13.1 08/06/2023   HCT 39.7 08/06/2023   MCV 88.6 08/06/2023   MCH 28.9 01/02/2023   RDW 13.2 08/06/2023   PLT 253.0 08/06/2023   Last metabolic panel Lab Results  Component Value Date   GLUCOSE 86 08/06/2023   NA 140 08/06/2023   K 3.7 08/06/2023   CL 101 08/06/2023   CO2 32 08/06/2023   BUN 18 08/06/2023   CREATININE 1.46 08/06/2023   GFR 52.62 (L) 08/06/2023   CALCIUM 9.5 08/06/2023   PHOS 3.3 02/09/2016   PROT 7.6 08/06/2023   ALBUMIN 4.2 08/06/2023   BILITOT 0.5 08/06/2023   ALKPHOS 80 08/06/2023   AST 26 08/06/2023   ALT 18 08/06/2023   ANIONGAP 8 01/02/2023   Last lipids Lab Results  Component Value Date   CHOL 143 08/06/2023   HDL 43.00 08/06/2023   LDLCALC 82 08/06/2023   TRIG 92.0 08/06/2023   CHOLHDL 3 08/06/2023   Last hemoglobin A1c No results found for: "HGBA1C" Last thyroid functions Lab Results  Component Value Date   TSH 2.13 08/06/2023   Last vitamin D No results found for: "25OHVITD2", "25OHVITD3", "VD25OH" Last vitamin B12 and Folate No results found for: "VITAMINB12", "FOLATE"    The 10-year ASCVD risk score (Arnett DK, et al., 2019) is: 16.6%    Assessment & Plan:   Problem List Items Addressed This Visit   None Visit Diagnoses  Obstructive sleep apnea    -  Primary   Relevant Orders   Ambulatory referral to Pulmonology     Assessment and Plan    Obstructive Sleep Apnea (OSA)   Discussed CPAP supplies and settings due to a change in insurance. OSA is managed with CPAP therapy, last adjusted to 14 cm H2O in July 2020. Current settings are uncertain, and there has been no recent pulmonary follow-up. Emphasized the importance of regular follow-ups to monitor and adjust CPAP settings to prevent complications like cardiovascular issues and daytime fatigue. Agreed to re-establish care with Dr. Vassie Loll in  pulmonary. Confirm CPAP pressure setting at 14 cm H2O. Contact [facility] to inform him that no documentation was received. Send updated CPAP settings to the supply company once confirmed.        No follow-ups on file.    [name], DO

## 2023-12-17 ENCOUNTER — Emergency Department (HOSPITAL_COMMUNITY)

## 2023-12-17 ENCOUNTER — Emergency Department (HOSPITAL_COMMUNITY)
Admission: EM | Admit: 2023-12-17 | Discharge: 2023-12-17 | Disposition: A | Discharge status: Home / Self Care | Attending: Emergency Medicine | Admitting: Emergency Medicine

## 2023-12-17 ENCOUNTER — Encounter (HOSPITAL_COMMUNITY): Payer: Self-pay

## 2023-12-17 ENCOUNTER — Ambulatory Visit: Payer: Self-pay | Admitting: Family Medicine

## 2023-12-17 ENCOUNTER — Other Ambulatory Visit: Payer: Self-pay

## 2023-12-17 DIAGNOSIS — I159 Secondary hypertension, unspecified: Secondary | ICD-10-CM

## 2023-12-17 DIAGNOSIS — R29810 Facial weakness: Secondary | ICD-10-CM | POA: Diagnosis not present

## 2023-12-17 DIAGNOSIS — R42 Dizziness and giddiness: Secondary | ICD-10-CM

## 2023-12-17 DIAGNOSIS — N181 Chronic kidney disease, stage 1: Secondary | ICD-10-CM | POA: Insufficient documentation

## 2023-12-17 DIAGNOSIS — I129 Hypertensive chronic kidney disease with stage 1 through stage 4 chronic kidney disease, or unspecified chronic kidney disease: Secondary | ICD-10-CM | POA: Diagnosis not present

## 2023-12-17 DIAGNOSIS — R519 Headache, unspecified: Secondary | ICD-10-CM

## 2023-12-17 LAB — COMPREHENSIVE METABOLIC PANEL
ALT: 20 U/L (ref 0–44)
AST: 24 U/L (ref 15–41)
Albumin: 4 g/dL (ref 3.5–5.0)
Alkaline Phosphatase: 83 U/L (ref 38–126)
Anion gap: 9 (ref 5–15)
BUN: 12 mg/dL (ref 6–20)
CO2: 27 mmol/L (ref 22–32)
Calcium: 9.6 mg/dL (ref 8.9–10.3)
Chloride: 100 mmol/L (ref 98–111)
Creatinine, Ser: 1.36 mg/dL — ABNORMAL HIGH (ref 0.61–1.24)
GFR, Estimated: 60 mL/min — ABNORMAL LOW (ref >60)
Glucose, Bld: 134 mg/dL — ABNORMAL HIGH (ref 70–99)
Potassium: 3.4 mmol/L — ABNORMAL LOW (ref 3.5–5.1)
Sodium: 136 mmol/L (ref 135–145)
Total Bilirubin: 0.8 mg/dL (ref 0.0–1.2)
Total Protein: 8.4 g/dL — ABNORMAL HIGH (ref 6.5–8.1)

## 2023-12-17 LAB — CBC WITH DIFFERENTIAL/PLATELET
Abs Immature Granulocytes: 0.01 10*3/uL (ref 0.00–0.07)
Basophils Absolute: 0 10*3/uL (ref 0.0–0.1)
Basophils Relative: 1 %
Eosinophils Absolute: 0.1 10*3/uL (ref 0.0–0.5)
Eosinophils Relative: 2 %
HCT: 43.9 % (ref 39.0–52.0)
Hemoglobin: 14.4 g/dL (ref 13.0–17.0)
Immature Granulocytes: 0 %
Lymphocytes Relative: 37 %
Lymphs Abs: 1.3 10*3/uL (ref 0.7–4.0)
MCH: 28.9 pg (ref 26.0–34.0)
MCHC: 32.8 g/dL (ref 30.0–36.0)
MCV: 88 fL (ref 80.0–100.0)
Monocytes Absolute: 0.5 10*3/uL (ref 0.1–1.0)
Monocytes Relative: 13 %
Neutro Abs: 1.7 10*3/uL (ref 1.7–7.7)
Neutrophils Relative %: 47 %
Platelets: 236 10*3/uL (ref 150–400)
RBC: 4.99 MIL/uL (ref 4.22–5.81)
RDW: 12.9 % (ref 11.5–15.5)
WBC: 3.6 10*3/uL — ABNORMAL LOW (ref 4.0–10.5)
nRBC: 0 % (ref 0.0–0.2)

## 2023-12-17 MED ORDER — HYDRALAZINE HCL 25 MG PO TABS
25.0000 mg | ORAL_TABLET | Freq: Once | ORAL | Status: AC
Start: 2023-12-17 — End: 2023-12-17
  Administered 2023-12-17: 25 mg via ORAL
  Filled 2023-12-17: qty 1

## 2023-12-17 MED ORDER — MECLIZINE HCL 25 MG PO TABS
25.0000 mg | ORAL_TABLET | Freq: Once | ORAL | Status: AC
  Administered 2023-12-17: 25 mg via ORAL
  Filled 2023-12-17: qty 1

## 2023-12-17 MED ORDER — HYDRALAZINE HCL 25 MG PO TABS: 25.0000 mg | ORAL_TABLET | Freq: Three times a day (TID) | ORAL | 0 refills | Status: AC

## 2023-12-17 NOTE — ED Notes (Signed)
 Upon patient assessment, patient noted to have asymmetry to face with right side affected. Patient endorses Bells Palsy. PA notified.

## 2023-12-17 NOTE — ED Provider Notes (Signed)
 [place] EMERGENCY DEPARTMENT AT [facility] Provider Note   CSN: [id number] Arrival date & time: 12/17/23  1203     History  Chief Complaint  Patient presents with   Hypertension   Dizziness    Bobby Castaneda. is a 58 y.o. male with medical history of Bell's palsy with right-sided facial droop, hypertension, hyperlipidemia, allergies, CKD stage I.  Patient presents to ED for evaluation of hypertension and dizziness.  The patient reports that this morning he woke up and noted that he was hypertensive.  He reports that he also woke up with some dizziness rated 8 out of 10 described as the room is spinning.  Denies a history of dizziness or vertigo.  States that he became concerned when he saw that his blood pressure also elevated.  Reports compliance on amlodipine, HCTZ, metoprolol at home.  Denies chest pain, shortness of breath, visual deficits.  States that his current dizziness is 4 out of 10, initially was 8 out of 10.  Complaining of frontal headache rated 5 out of 10.  Denies neck pain, fevers, double vision.   Hypertension Associated symptoms include headaches. Pertinent negatives include no chest pain and no shortness of breath.  Dizziness Associated symptoms: headaches   Associated symptoms: no chest pain, no nausea, no shortness of breath and no vomiting        Home Medications Prior to Admission medications   Medication Sig Start Date End Date Taking? Authorizing Provider  hydrALAZINE (APRESOLINE) 25 MG tablet Take 1 tablet (25 mg total) by mouth 3 (three) times daily. 12/17/23  Yes Al Decant, PA-C  amLODipine (NORVASC) 10 MG tablet Take 1 tablet (10 mg total) by mouth daily. 02/05/23   [name], DO  atorvastatin (LIPITOR) 40 MG tablet Take 1 tablet (40 mg total) by mouth at bedtime. 09/03/23   [name], DO  cyclobenzaprine (FLEXERIL) 10 MG tablet Take 1 tablet (10 mg total) by mouth 3 (three) times daily as needed for  muscle spasms. 02/05/23   Seabron Spates R, DO  fluticasone (FLONASE) 50 MCG/ACT nasal spray Place 2 sprays into both nostrils daily. 01/22/23   [name], DO  hydrochlorothiazide (HYDRODIURIL) 25 MG tablet Take 1 tablet (25 mg total) by mouth daily. 10/14/23   [name], DO  metoprolol succinate (TOPROL-XL) 25 MG 24 hr tablet Take 1 tablet (25 mg total) by mouth daily. 10/10/23   Seabron Spates R, DO  potassium chloride SA (KLOR-CON M) 20 MEQ tablet Take 3 tablets (60 mEq total) by mouth daily. 08/06/23   [name], DO  sildenafil (VIAGRA) 100 MG tablet Take 1/2-1 tablet (50-100 mg total) by mouth daily as needed for erectile dysfuntion. 01/22/23 01/22/24  [name], DO      Allergies    Ace inhibitors    Review of Systems   Review of Systems  Constitutional:  Negative for fever.  Eyes:  Negative for photophobia and visual disturbance.  Respiratory:  Negative for shortness of breath.   Cardiovascular:  Negative for chest pain.  Gastrointestinal:  Negative for nausea and vomiting.  Musculoskeletal:  Negative for neck pain.  Neurological:  Positive for dizziness and headaches.  All other systems reviewed and are negative.   Physical Exam Updated Vital Signs BP (!) 216/113   Pulse (!) 52   Temp 98.4 F (36.9 C)   Resp 19   Ht 6\' 1"  (1.854 m)  Wt 104.3 kg   SpO2 98%   BMI 30.34 kg/m  Physical Exam Vitals and nursing note reviewed.  Constitutional:      General: He is not in acute distress.    Appearance: He is well-developed.  HENT:     Head: Normocephalic and atraumatic.  Eyes:     Conjunctiva/sclera: Conjunctivae normal.  Cardiovascular:     Rate and Rhythm: Normal rate and regular rhythm.     Heart sounds: No murmur heard. Pulmonary:     Effort: Pulmonary effort is normal. No respiratory distress.     Breath sounds: Normal breath sounds.  Abdominal:     Palpations: Abdomen is soft.     Tenderness: There is no  abdominal tenderness.  Musculoskeletal:        General: No swelling.     Cervical back: Neck supple.  Skin:    General: Skin is warm and dry.     Capillary Refill: Capillary refill takes less than 2 seconds.  Neurological:     General: No focal deficit present.     Mental Status: He is alert and oriented to person, place, and time.     GCS: GCS eye subscore is 4. GCS verbal subscore is 5. GCS motor subscore is 6.     Sensory: Sensation is intact. No sensory deficit.     Motor: Motor function is intact. No weakness.     Coordination: Coordination is intact. Heel to [facility] Test normal.     Comments: Right-sided facial droop, patient reports that this is at baseline.  All other cranial nerves intact.  Equal grip strength and sensation upper extremities bilaterally.  Equal strength bilateral lower extremities.  No pronator drift or slurred speech.  No facial droop.  No dysmetria.  Psychiatric:        Mood and Affect: Mood normal.     ED Results / Procedures / Treatments   Labs (all labs ordered are listed, but only abnormal results are displayed) Labs Reviewed  COMPREHENSIVE METABOLIC PANEL - Abnormal; Notable for the following components:      Result Value   Potassium 3.4 (*)    Glucose, Bld 134 (*)    Creatinine, Ser 1.36 (*)    Total Protein 8.4 (*)    GFR, Estimated 60 (*)    All other components within normal limits  CBC WITH DIFFERENTIAL/PLATELET - Abnormal; Notable for the following components:   WBC 3.6 (*)    All other components within normal limits    EKG EKG Interpretation Date/Time:  Tuesday December 17 2023 14:14:04 EDT Ventricular Rate:  57 PR Interval:  204 QRS Duration:  98 QT Interval:  472 QTC Calculation: 459 R Axis:   69  Text Interpretation: Sinus bradycardia Otherwise normal ECG When compared with ECG of 16-Feb-2010 06:54, PREVIOUS ECG IS PRESENT Confirmed by Eber Hong ([id number]) on 12/17/2023 5:34:48 PM  Radiology CT Head Wo Contrast Result Date:  12/17/2023 CLINICAL DATA:  Mental status change of unknown cause. Dizziness. Lightheaded. Nausea. EXAM: CT HEAD WITHOUT CONTRAST TECHNIQUE: Contiguous axial images were obtained from the base of the skull through the vertex without intravenous contrast. RADIATION DOSE REDUCTION: This exam was performed according to the departmental dose-optimization program which includes automated exposure control, adjustment of the mA and/or kV according to patient size and/or use of iterative reconstruction technique. COMPARISON:  01/02/2023 FINDINGS: Brain: The brain shows a normal appearance without evidence of malformation, atrophy, old or acute small or large vessel infarction, mass lesion, hemorrhage, hydrocephalus  or extra-axial collection. Vascular: No hyperdense vessel. No evidence of atherosclerotic calcification. Skull: Normal.  No traumatic finding.  No focal bone lesion. Sinuses/Orbits: Sinuses are clear. Orbits appear normal. Mastoids are clear. Other: None significant IMPRESSION: Normal head CT. Electronically Signed   By: Paulina Fusi M.D.   On: 12/17/2023 16:12    Procedures Procedures   Medications Ordered in ED Medications  meclizine (ANTIVERT) tablet 25 mg (25 mg Oral Given 12/17/23 1820)  hydrALAZINE (APRESOLINE) tablet 25 mg (25 mg Oral Given 12/17/23 1820)    ED Course/ Medical Decision Making/ A&P  Medical Decision Making  58 year old presents for evaluation of headache, hypertension and dizziness.  Please see HPI for further details.  On my examination the patient is afebrile and nontachycardic.  His lung sounds are clear bilaterally, he is not hypoxic.  His abdomen is soft and compressible throughout.  His neurological examinations at baseline.  He does have baseline right-sided facial droop as a result of Bell's palsy which is been ongoing for the last 1 year.  He denies any changes right-sided facial droop.  The rest of his cranial nerves are intact.  He has equal grip strength and  sensation of his upper extremities bilaterally.  Equal strength and sensation bilateral lower extremities.  No pronator drift slurred speech.  Overall nontoxic in appearance.  Patient had CBC, CMP, CT head ordered in triage.  The patient CBC shows no leukocytosis or anemia.  Patient metabolic panel shows a slightly reduced potassium 3.4, baseline creatinine 1.36, anion gap 9.  CT scan of his head is unremarkable.  Patient was given meclizine for dizziness.  On reassessment the patient reports that his dizziness has decreased significantly.  He also given hydralazine for high blood pressure. Blood pressure has not shown any signs of improvement however patient has no chest pain, shortness of breath or blurred vision.  He is complaining of a very minimal headache rated 3 out of 10.  Discussed with my attending Dr. Hyacinth Meeker who feels as if the patient should have hydralazine added to his medication regimen and will follow-up outpatient with his PCP.  The patient is agreeable to this plan.  The patient will be discharged at this time.  Was given return precautions and he voiced understanding.  Stable to discharge home.  Final Clinical Impression(s) / ED Diagnoses Final diagnoses:  Secondary hypertension  Dizziness  Acute nonintractable headache, unspecified headache type    Rx / DC Orders ED Discharge Orders          Ordered    hydrALAZINE (APRESOLINE) 25 MG tablet  3 times daily        12/17/23 1957              Clent Ridges 12/17/23 Brooke Pace    Eber Hong, MD 12/19/23 [phone]

## 2023-12-17 NOTE — ED Triage Notes (Signed)
 Patient reports dizziness when he woke up around 5:30 this morning and thought it would resolve after eating. Patient checked his BP around 10:30 199/107 after taking BP meds. Patient reports headache, dizziness, and nausea at this time. Patient has right facial droop from bells palsy at baseline.

## 2023-12-17 NOTE — Telephone Encounter (Signed)
FYI. Pt going to ED.  

## 2023-12-17 NOTE — Telephone Encounter (Signed)
  Chief Complaint: High blood pressure Symptoms: dizziness, headache, nausea Frequency: onset 30 minutes ago Pertinent Negatives: Patient denies chest pain, shortness of breath Disposition: [x] ED /[] Urgent Care (no appt availability in office) / [] Appointment(In office/virtual)/ []  [place] [facility]/ [] Home Care/ [] Refused Recommended Disposition /[] [place] [facility]/ []  Follow-up with PCP Additional Notes:  Spoke with patient who reports blood pressure 30 minutes ago was 199/107, rechecked blood pressure while on the call 199/108. Still has headache and new onset nausea. Emergency room advised. Patient will obtain ride and proceed to emergency room for evaluation.   Message from Isabell A sent at 12/17/2023 11:00 AM EDT  Copied From CRM [id number]. Reason for Triage: Patient states his head is spinning, blood pressure is 199/107, stomach is also upset.   Reason for Disposition  [1] Systolic BP  >= 160 OR Diastolic >= 100 AND [2] cardiac (e.g., breathing difficulty, chest pain) or neurologic symptoms (e.g., new-onset blurred or double vision, unsteady gait)  Answer Assessment - Initial Assessment Questions 1. BLOOD PRESSURE: "What is the blood pressure?" "Did you take at least two measurements 5 minutes apart?"     199/107 2. ONSET: "When did you take your blood pressure?"     30 minutes  3. HOW: "How did you take your blood pressure?" (e.g., automatic home BP monitor, visiting nurse)     Auto cuff at home 4. HISTORY: "Do you have a history of high blood pressure?"     Yes 5. MEDICINES: "Are you taking any medicines for blood pressure?" "Have you missed any doses recently?"     On medication 6. OTHER SYMPTOMS: "Do you have any symptoms?" (e.g., blurred vision, chest pain, difficulty breathing, headache, weakness)     Nausea, headache  Protocols used: Blood Pressure - High-A-AH

## 2023-12-17 NOTE — Discharge Instructions (Signed)
 It was a pleasure taking part in your care.  As discussed, we are adding 25 mg of hydralazine to your blood pressure medication regimen at home.  Please take this 3 times a day.  Please take a dose when you arrive home tonight.  If you wake up in the morning and you are still having a headache with elevated blood pressures, please return to the ED for further management and care.  If your headache subsides and your blood pressure is more well-controlled in the morning, please follow-up with your PCP in the next 1 week.  You may return to the ED with any new or worsening signs or symptoms.

## 2023-12-17 NOTE — ED Provider Triage Note (Signed)
 Emergency Medicine Provider Triage Evaluation Note  Bobby Castaneda. , a 58 y.o. male  was evaluated in triage.  Pt complains of dizziness, HTN.  Review of Systems  Positive:  Negative:   Physical Exam  BP (!) 210/106   Pulse (!) 55   Temp 97.6 F (36.4 C)   Resp 17   Ht 6\' 1"  (1.854 m)   Wt 104.3 kg   SpO2 100%   BMI 30.34 kg/m  Gen:   Awake, no distress   Resp:  Normal effort  MSK:   Moves extremities without difficulty  Other:    Medical Decision Making  Medically screening exam initiated at 2:00 PM.  Appropriate orders placed.  Bobby Castaneda. was informed that the remainder of the evaluation will be completed by another provider, this initial triage assessment does not replace that evaluation, and the importance of remaining in the ED until their evaluation is complete.  LKN 12AM. Patient woke up this morning feeling dizzy/lightheaded and nauseated. Denies vision changes, headache, chest pain, SOB, vomiting, diarrhea. Patient stating that he is able to ambulate. Patient did take BP medications this morning and is having some refractory HTN.   Bobby Castaneda, [place] 12/17/23 [phone]

## 2023-12-17 NOTE — ED Provider Notes (Signed)
 This patient is a 58 year old male presenting with vertigo that started this morning, he states that when he lays down he gets worse when he sits up it gets better and on my exam the patient is asymptomatic.  My laying down and turning to the left induces vertigo and nystagmus.  When I set him back up this resolves within about 2 minutes.  He has a normal neurologic exam otherwise except for facial droop which she states he has had a Bell's palsy involving his unilateral face for over a year.  Normal strength and sensation of the arms and legs, normal peripheral visual fields and visual acuity grossly.  The patient is agreeable to the plan, he is hypertensive and will be treated with hydralazine.  Meclizine will be added, labs pending, I do not think this is a central source based on my exam and feel comfortable letting the patient go home and follow-up outpatient.  The patient is agreeable to this plan as well   Eber Hong, MD 12/19/23 1441

## 2023-12-25 ENCOUNTER — Other Ambulatory Visit (HOSPITAL_BASED_OUTPATIENT_CLINIC_OR_DEPARTMENT_OTHER): Payer: Self-pay

## 2023-12-25 ENCOUNTER — Other Ambulatory Visit (HOSPITAL_COMMUNITY): Payer: Self-pay

## 2024-01-13 ENCOUNTER — Other Ambulatory Visit: Payer: Self-pay | Admitting: Family Medicine

## 2024-01-13 ENCOUNTER — Other Ambulatory Visit (HOSPITAL_COMMUNITY): Payer: Self-pay

## 2024-01-13 DIAGNOSIS — I10 Essential (primary) hypertension: Secondary | ICD-10-CM

## 2024-01-13 MED ORDER — AMLODIPINE BESYLATE 10 MG PO TABS
10.0000 mg | ORAL_TABLET | Freq: Every day | ORAL | 0 refills | Status: DC
  Filled 2024-01-13: qty 30, 30d supply, fill #0

## 2024-01-22 ENCOUNTER — Other Ambulatory Visit (HOSPITAL_COMMUNITY): Payer: Self-pay

## 2024-01-22 ENCOUNTER — Other Ambulatory Visit: Payer: Self-pay | Admitting: Family Medicine

## 2024-01-22 DIAGNOSIS — I10 Essential (primary) hypertension: Secondary | ICD-10-CM

## 2024-01-22 MED ORDER — HYDROCHLOROTHIAZIDE 25 MG PO TABS
25.0000 mg | ORAL_TABLET | Freq: Every day | ORAL | 1 refills | Status: DC
  Filled 2024-01-22: qty 90, 90d supply, fill #0

## 2024-01-24 ENCOUNTER — Other Ambulatory Visit (HOSPITAL_COMMUNITY): Payer: Self-pay

## 2024-01-28 ENCOUNTER — Other Ambulatory Visit: Payer: Self-pay | Admitting: Family Medicine

## 2024-01-28 ENCOUNTER — Other Ambulatory Visit (HOSPITAL_COMMUNITY): Payer: Self-pay

## 2024-01-28 DIAGNOSIS — N529 Male erectile dysfunction, unspecified: Secondary | ICD-10-CM

## 2024-01-28 MED ORDER — SILDENAFIL CITRATE 100 MG PO TABS
50.0000 mg | ORAL_TABLET | Freq: Every day | ORAL | 4 refills | Status: AC | PRN
  Filled 2024-01-28: qty 10, 10d supply, fill #0
  Filled 2024-05-18: qty 10, 10d supply, fill #1

## 2024-02-04 ENCOUNTER — Encounter: Payer: Self-pay | Admitting: Primary Care

## 2024-02-04 ENCOUNTER — Ambulatory Visit: Admitting: Primary Care

## 2024-02-04 VITALS — BP 149/80 | HR 68 | Temp 97.6°F | Ht 73.0 in | Wt 231.2 lb

## 2024-02-04 DIAGNOSIS — G4733 Obstructive sleep apnea (adult) (pediatric): Secondary | ICD-10-CM | POA: Diagnosis not present

## 2024-02-04 NOTE — Patient Instructions (Signed)
 -OBSTRUCTIVE SLEEP APNEA: Obstructive sleep apnea is a condition where your airway becomes blocked during sleep, causing breathing pauses. Your condition is well-controlled with your CPAP therapy, which you are using consistently. Your current CPAP pressure is set at 14 cm H2O, and your apnea-hypopnea index (AHI) is 2, indicating well-controlled apnea. You should continue using your CPAP machine every night and during naps to manage daytime fatigue. We discussed the importance of regular cleaning and timely replacement of CPAP supplies. You may need to purchase additional supplies online if you need them sooner than your insurance allows. We also talked about the potential out-of-pocket costs if your insurance deductible is not met. Please schedule your annual follow-up appointment.   INSTRUCTIONS: Please continue using your CPAP machine every night and during naps. Order your CPAP supplies as per the recommended time frame, and consider purchasing additional supplies online if needed sooner. Schedule your annual follow-up appointment.  Orders: Renew CPAP supplies (please order)  Follow-up: 1 year with Beth NP    CPAP and BIPAP Information CPAP and BIPAP use air pressure to keep your airways open and help you breathe well. CPAP and BIPAP use different amounts of pressure. Your health care provider will tell you whether CPAP or BIPAP would be best for you. CPAP stands for continuous positive airway pressure. With CPAP, the amount of pressure stays the same while you breathe in and out. BIPAP stands for bi-level positive airway pressure. With BIPAP, the amount of pressure will be higher when you breathe in and lower when you breathe out. This allows you to take bigger breaths. CPAP or BIPAP may be used in the hospital or at home. You may need to have a sleep study before your provider can order a device for you to use at home. What are the advantages? CPAP and BIPAP are most often used for  obstructive sleep apnea to keep the airways from collapsing when the muscles relax during sleep. CPAP or BIPAP can be used if you have: Chronic obstructive pulmonary disease. Heart failure. Medical conditions that cause muscle weakness. Other problems that cause breathing to be shallow, weak, or difficult. What are the risks? Your provider will talk with you about risks. These may include: Sores on your nose or face caused from the mask, prongs, or nasal pillows. Dry or stuffy nose or nosebleeds. Feeling gassy or bloated. Sinus or lung infection if the equipment is not cleaned well. When should CPAP or BIPAP be used? In most cases, CPAP or BIPAP is used during sleep at night or whenever the main sleep time happens. It's also used during naps. People with some medical conditions may need to wear the mask when they're awake. Follow instructions from your provider about when to use your CPAP or BIPAP. What happens during CPAP or BIPAP?  Both CPAP and BIPAP use a small machine that uses electricity to create air pressure. A long tube connects the device to a plastic mask. Air is blown through the mask into your nose or mouth. The amount of pressure that's used to blow the air can be adjusted. Your provider will set the pressure setting and help you find the best mask for you. Tips for using the mask There are different types and sizes of masks. If your mask does not fit well, talk with your provider about getting a different one. Some common types of masks include: Full face masks, which fit over the mouth and nose. Nasal masks, which fit over the nose. Nasal pillow  or prong masks, which fit into the nostrils. The mask needs to be snug to your face, so some people feel trapped or closed in at first. If you feel this way, you may need to get used to the mask. Hold the mask loosely over your nose or mouth and then gradually put the the mask on more snugly. Slowly increase the amount of time you use  the mask. If you have trouble with your mask not fitting well or leaking, talk with your provider. Do not stop using the mask. Tips for using the device Follow instructions from your provider about how to and how often to use the device. For home use, CPAP and BIPAP devices come from home health care companies. There are many different brands. Your health insurance company will help to decide which device you get. Keep the CPAP or BIPAP device and attachments clean. Ask your home health care company or check the instruction book for cleaning instructions. Make sure the humidifier is filled with germ-free (sterile) water and is working correctly. This will help prevent a dry or stuffy nose or nosebleeds. A nasal saline mist or spray may keep your nose from getting dry and sore. Do not eat or drink while the CPAP or BIPAP device is on. Food or drinks could get pushed into your lungs by the pressure of the CPAP or BIPAP. Follow these instructions at home: Take over-the-counter and prescription medicines only as told by your provider. Do not smoke, vape, or use nicotine or tobacco. Contact a health care provider if: You have redness or pressure sores on your head, face, mouth, or nose from the mask or headgear. You have trouble using the CPAP or BIPAP device. You have trouble going to sleep or staying asleep. Someone tells you that you snore even when wearing your CPAP or BIPAP device. Get help right away if: You have trouble breathing. You feel confused. These symptoms may be an emergency. Get help right away. Call 911. Do not wait to see if the symptoms will go away. Do not drive yourself to the hospital. This information is not intended to replace advice given to you by your health care provider. Make sure you discuss any questions you have with your health care provider. Document Revised: 01/16/2023 Document Reviewed: 01/16/2023 Elsevier Patient Education  2024 [employer].

## 2024-02-04 NOTE — Progress Notes (Signed)
 @Patient  ID: Bobby Castaneda., male    DOB: 1966/08/06, 58 y.o.   MRN: [id number]  Chief Complaint  Patient presents with   Follow-up    Sleep Consult- Snoring, Has OSA but wants to get back on CPAP    Referring provider: Estill Hemming, *  HPI: 58 year old male, never smoked. PMH significant for HTN, OSA, allergic rhinitis, CKD, hyperlipidemia, obesity.  02/04/2024 Discussed the use of AI scribe software for clinical note transcription with the patient, who gave verbal consent to proceed.  History of Present Illness   Bobby Castaneda. is a 58 year old male with sleep apnea who presents for a routine follow-up regarding his CPAP therapy. He was a former patient of Dr. Villa Greaser, last seen in May 2020.   He was diagnosed with sleep apnea in 2013 and has been using a CPAP machine since then. His current CPAP pressure is set at 14 cm H2O. He maintains 100% compliance with CPAP use over the past 30 days, averaging 5 hours and 30 minutes per night. His apnea-hypopnea index (AHI) is 2.0. Initially, during his sleep study in 2013, he experienced 40 apneic events per hour, classified as severe.  He has no issues with the CPAP machine itself, which was obtained within the last five years. He experiences occasional discomfort with the nasal mask, which covers his nose, and has previously used nasal pillows. His previous CPAP pressure setting was 16 cm H2O, which he found uncomfortable. He receives CPAP supplies through his insurance every quarter but wants more frequent replacement of certain items like the head strap.  His sleep schedule includes going to bed between 11:30 PM and 12:00 AM, falling asleep within 10-15 minutes, and waking up at 5:30 AM. He experiences mild to moderate daytime sleepiness, with an Epworth Sleepiness Scale score of 10 out of 24. He feels tired during the day, which he attributes to his sleep schedule, as he only gets about six hours of sleep per night. He notes a  significant difference in how he feels when using the CPAP compared to not using it, stating he feels like 'garbage' without it.     Airview download 01/04/2024 - 02/02/2024 Usage days 30/30 days (100%) Average usage 5 hours 30 minutes Pressure 14 cm H2O Air leaks median 19.8 L/min; 95% 75.8 L/min AHI 2.0  Allergies  Allergen Reactions   Ace Inhibitors Other (See Comments)    angioedema    Immunization History  Administered Date(s) Administered   Influenza Split 10/22/2011   Influenza Whole 07/05/2008   Influenza, Seasonal, Injecte, Preservative Fre 08/06/2023   Influenza,inj,Quad PF,6+ Mos 07/19/2015, 12/21/2016, 06/24/2017, 07/01/2018, 06/12/2019, 07/07/2020   PFIZER(Purple Top)SARS-COV-2 Vaccination 12/05/2019, 12/26/2019   Pfizer Covid-19 Vaccine Bivalent Booster 11yrs & up 09/28/2021   Td 10/27/2004   Tdap 07/19/2015   Zoster Recombinant(Shingrix) 05/08/2021, 07/12/2021    Past Medical History:  Diagnosis Date   Allergy    Bell's palsy    History   Hyperlipidemia    Hypertension    Sleep apnea    uses CPAP nightly    Tobacco History: Social History   Tobacco Use  Smoking Status Never  Smokeless Tobacco Never   Counseling given: Not Answered   Outpatient Medications Prior to Visit  Medication Sig Dispense Refill   amLODipine  (NORVASC ) 10 MG tablet Take 1 tablet (10 mg total) by mouth daily. 30 tablet 0   atorvastatin  (LIPITOR) 40 MG tablet Take 1 tablet (40 mg total) by mouth  at bedtime. 90 tablet 1   cyclobenzaprine  (FLEXERIL ) 10 MG tablet Take 1 tablet (10 mg total) by mouth 3 (three) times daily as needed for muscle spasms. 30 tablet 0   fluticasone  (FLONASE ) 50 MCG/ACT nasal spray Place 2 sprays into both nostrils daily. 16 g 6   hydrALAZINE  (APRESOLINE ) 25 MG tablet Take 1 tablet (25 mg total) by mouth 3 (three) times daily. 90 tablet 0   hydrochlorothiazide  (HYDRODIURIL ) 25 MG tablet Take 1 tablet (25 mg total) by mouth daily. 90 tablet 1   metoprolol   succinate (TOPROL -XL) 25 MG 24 hr tablet Take 1 tablet (25 mg total) by mouth daily. 90 tablet 1   potassium chloride  SA (KLOR-CON  M) 20 MEQ tablet Take 3 tablets (60 mEq total) by mouth daily. 270 tablet 1   sildenafil  (VIAGRA ) 100 MG tablet Take 1/2-1 tablet (50-100 mg total) by mouth daily as needed for erectile dysfuntion. 10 tablet 4   Facility-Administered Medications Prior to Visit  Medication Dose Route Frequency Provider Last Rate Last Admin   0.9 %  sodium chloride  infusion  500 mL Intravenous Continuous Tobin Forts, MD       0.9 %  sodium chloride  infusion  500 mL Intravenous Once Tobin Forts, MD       Review of Systems  Review of Systems  Constitutional:  Positive for fatigue.  HENT: Negative.    Respiratory: Negative.    Cardiovascular: Negative.      Physical Exam  BP (!) 149/80 (BP Location: Right Arm, Patient Position: Sitting, Cuff Size: Large)   Pulse 68   Temp 97.6 F (36.4 C) (Temporal)   Ht 6\' 1"  (1.854 m)   Wt 231 lb 3.2 oz (104.9 kg)   SpO2 96%   BMI 30.50 kg/m  Physical Exam Constitutional:      Appearance: Normal appearance.  HENT:     Head: Normocephalic and atraumatic.  Cardiovascular:     Rate and Rhythm: Normal rate and regular rhythm.  Pulmonary:     Effort: Pulmonary effort is normal.     Breath sounds: Normal breath sounds.  Musculoskeletal:        General: Normal range of motion.  Skin:    General: Skin is warm and dry.  Neurological:     General: No focal deficit present.     Mental Status: He is alert and oriented to person, place, and time. Mental status is at baseline.  Psychiatric:        Mood and Affect: Mood normal.        Behavior: Behavior normal.        Thought Content: Thought content normal.        Judgment: Judgment normal.      Lab Results:  CBC    Component Value Date/Time   WBC 3.6 (L) 12/17/2023 1740   RBC 4.99 12/17/2023 1740   HGB 14.4 12/17/2023 1740   HCT 43.9 12/17/2023 1740   PLT 236 12/17/2023  1740   MCV 88.0 12/17/2023 1740   MCH 28.9 12/17/2023 1740   MCHC 32.8 12/17/2023 1740   RDW 12.9 12/17/2023 1740   LYMPHSABS 1.3 12/17/2023 1740   MONOABS 0.5 12/17/2023 1740   EOSABS 0.1 12/17/2023 1740   BASOSABS 0.0 12/17/2023 1740    BMET    Component Value Date/Time   NA 136 12/17/2023 1740   NA 142 09/27/2020 1216   K 3.4 (L) 12/17/2023 1740   CL 100 12/17/2023 1740   CO2 27 12/17/2023 1740  GLUCOSE 134 (H) 12/17/2023 1740   GLUCOSE 90 08/15/2006 1121   BUN 12 12/17/2023 1740   BUN 17 09/27/2020 1216   CREATININE 1.36 (H) 12/17/2023 1740   CREATININE 1.42 (H) 01/09/2019 1509   CALCIUM  9.6 12/17/2023 1740   GFRNONAA 60 (L) 12/17/2023 1740   GFRAA 64 09/27/2020 1216    BNP No results found for: "BNP"  ProBNP No results found for: "PROBNP"  Imaging: No results found.   Assessment & Plan:   1. OSA (obstructive sleep apnea) (Primary) - AMB REFERRAL FOR DME  Assessment and Plan    Obstructive Sleep Apnea Obstructive sleep apnea diagnosed in 2013, currently well-controlled with CPAP therapy. CPAP compliance is 100% with an average use of 5 hours and 30 minutes per night. Current CPAP pressure is 14 cm H2O, and the apnea-hypopnea index (AHI) is 2, indicating well-controlled apnea. Initial AHI was 40, indicating severe apnea. No issues with the CPAP machine itself, but some discomfort with the nasal mask. Daytime sleepiness is mild to moderate, with an Epworth Sleepiness Scale score of 10/24. CPAP therapy has significantly improved daytime alertness. Discussed the importance of using CPAP during naps to manage fatigue. Current CPAP machine is within the typical lifespan, and insurance coverage for a new machine was discussed. Encouraged to maintain regular cleaning and supply replacement. Discussed potential out-of-pocket costs if insurance deductible is not met and advised to plan for future expenses. - Continue CPAP therapy with pressure setting of 14 cm H2O. - Use  CPAP during naps to manage daytime fatigue. - Order renewal of CPAP supplies as per recommended time frame. - Consider purchasing additional supplies online if needed sooner. - Schedule annual follow-up appointment. - FU in 1 year or sooner if needed  Antonio Baumgarten, NP 02/04/2024

## 2024-02-14 ENCOUNTER — Other Ambulatory Visit (HOSPITAL_COMMUNITY): Payer: Self-pay

## 2024-02-14 MED ORDER — POTASSIUM CHLORIDE ER 20 MEQ PO TBCR
40.0000 meq | EXTENDED_RELEASE_TABLET | Freq: Every day | ORAL | 0 refills | Status: DC
  Filled 2024-02-14: qty 180, 90d supply, fill #0

## 2024-02-18 ENCOUNTER — Other Ambulatory Visit (HOSPITAL_COMMUNITY): Payer: Self-pay

## 2024-02-18 ENCOUNTER — Other Ambulatory Visit: Payer: Self-pay | Admitting: Family Medicine

## 2024-02-18 DIAGNOSIS — I10 Essential (primary) hypertension: Secondary | ICD-10-CM

## 2024-02-18 MED ORDER — AMLODIPINE BESYLATE 10 MG PO TABS
10.0000 mg | ORAL_TABLET | Freq: Every day | ORAL | 0 refills | Status: DC
  Filled 2024-02-18: qty 30, 30d supply, fill #0

## 2024-03-23 ENCOUNTER — Other Ambulatory Visit (HOSPITAL_COMMUNITY): Payer: Self-pay

## 2024-03-23 ENCOUNTER — Other Ambulatory Visit: Payer: Self-pay | Admitting: Family Medicine

## 2024-03-23 DIAGNOSIS — I10 Essential (primary) hypertension: Secondary | ICD-10-CM

## 2024-03-23 MED ORDER — AMLODIPINE BESYLATE 10 MG PO TABS
10.0000 mg | ORAL_TABLET | Freq: Every day | ORAL | 0 refills | Status: DC
  Filled 2024-03-23: qty 30, 30d supply, fill #0

## 2024-03-26 ENCOUNTER — Encounter: Admitting: Family Medicine

## 2024-03-27 ENCOUNTER — Encounter: Admitting: Family Medicine

## 2024-03-30 ENCOUNTER — Encounter: Payer: Self-pay | Admitting: Family Medicine

## 2024-03-30 ENCOUNTER — Other Ambulatory Visit (HOSPITAL_COMMUNITY): Payer: Self-pay

## 2024-03-30 ENCOUNTER — Other Ambulatory Visit: Payer: Self-pay

## 2024-03-30 ENCOUNTER — Ambulatory Visit (INDEPENDENT_AMBULATORY_CARE_PROVIDER_SITE_OTHER): Admitting: Family Medicine

## 2024-03-30 VITALS — BP 124/80 | HR 62 | Temp 98.0°F | Resp 18 | Ht 73.0 in | Wt 233.4 lb

## 2024-03-30 DIAGNOSIS — I10 Essential (primary) hypertension: Secondary | ICD-10-CM

## 2024-03-30 DIAGNOSIS — N529 Male erectile dysfunction, unspecified: Secondary | ICD-10-CM

## 2024-03-30 DIAGNOSIS — N181 Chronic kidney disease, stage 1: Secondary | ICD-10-CM | POA: Diagnosis not present

## 2024-03-30 DIAGNOSIS — Z0000 Encounter for general adult medical examination without abnormal findings: Secondary | ICD-10-CM

## 2024-03-30 DIAGNOSIS — E785 Hyperlipidemia, unspecified: Secondary | ICD-10-CM

## 2024-03-30 DIAGNOSIS — Z125 Encounter for screening for malignant neoplasm of prostate: Secondary | ICD-10-CM | POA: Diagnosis not present

## 2024-03-30 LAB — CBC WITH DIFFERENTIAL/PLATELET
Basophils Absolute: 0 10*3/uL (ref 0.0–0.1)
Basophils Relative: 0.7 % (ref 0.0–3.0)
Eosinophils Absolute: 0.2 10*3/uL (ref 0.0–0.7)
Eosinophils Relative: 3.9 % (ref 0.0–5.0)
HCT: 39.3 % (ref 39.0–52.0)
Hemoglobin: 13.1 g/dL (ref 13.0–17.0)
Lymphocytes Relative: 42.2 % (ref 12.0–46.0)
Lymphs Abs: 1.9 10*3/uL (ref 0.7–4.0)
MCHC: 33.3 g/dL (ref 30.0–36.0)
MCV: 85.4 fl (ref 78.0–100.0)
Monocytes Absolute: 0.3 10*3/uL (ref 0.1–1.0)
Monocytes Relative: 7.6 % (ref 3.0–12.0)
Neutro Abs: 2.1 10*3/uL (ref 1.4–7.7)
Neutrophils Relative %: 45.6 % (ref 43.0–77.0)
Platelets: 226 10*3/uL (ref 150.0–400.0)
RBC: 4.6 Mil/uL (ref 4.22–5.81)
RDW: 14 % (ref 11.5–15.5)
WBC: 4.5 10*3/uL (ref 4.0–10.5)

## 2024-03-30 LAB — COMPREHENSIVE METABOLIC PANEL WITH GFR
ALT: 15 U/L (ref 0–53)
AST: 18 U/L (ref 0–37)
Albumin: 4.1 g/dL (ref 3.5–5.2)
Alkaline Phosphatase: 84 U/L (ref 39–117)
BUN: 18 mg/dL (ref 6–23)
CO2: 33 meq/L — ABNORMAL HIGH (ref 19–32)
Calcium: 9.4 mg/dL (ref 8.4–10.5)
Chloride: 101 meq/L (ref 96–112)
Creatinine, Ser: 1.38 mg/dL (ref 0.40–1.50)
GFR: 56.04 mL/min — ABNORMAL LOW (ref >60.00)
Glucose, Bld: 124 mg/dL — ABNORMAL HIGH (ref 70–99)
Potassium: 3.5 meq/L (ref 3.5–5.1)
Sodium: 140 meq/L (ref 135–145)
Total Bilirubin: 0.5 mg/dL (ref 0.2–1.2)
Total Protein: 7.4 g/dL (ref 6.0–8.3)

## 2024-03-30 LAB — TSH: TSH: 1.4 u[IU]/mL (ref 0.35–5.50)

## 2024-03-30 LAB — LIPID PANEL
Cholesterol: 138 mg/dL (ref 0–200)
HDL: 39.4 mg/dL (ref >39.00)
LDL Cholesterol: 78 mg/dL (ref 0–99)
NonHDL: 98.2
Total CHOL/HDL Ratio: 3
Triglycerides: 102 mg/dL (ref 0.0–149.0)
VLDL: 20.4 mg/dL (ref 0.0–40.0)

## 2024-03-30 LAB — PSA: PSA: 3.5 ng/mL (ref 0.10–4.00)

## 2024-03-30 MED ORDER — ATORVASTATIN CALCIUM 40 MG PO TABS
40.0000 mg | ORAL_TABLET | Freq: Every day | ORAL | 1 refills | Status: DC
  Filled 2024-03-30: qty 90, 90d supply, fill #0
  Filled 2024-07-06: qty 90, 90d supply, fill #1

## 2024-03-30 MED ORDER — HYDROCHLOROTHIAZIDE 25 MG PO TABS
25.0000 mg | ORAL_TABLET | Freq: Every day | ORAL | 1 refills | Status: DC
  Filled 2024-03-30: qty 90, 90d supply, fill #0
  Filled 2024-07-06: qty 90, 90d supply, fill #1

## 2024-03-30 MED ORDER — AMLODIPINE BESYLATE 10 MG PO TABS
10.0000 mg | ORAL_TABLET | Freq: Every day | ORAL | 1 refills | Status: DC
  Filled 2024-03-30 – 2024-04-20 (×3): qty 90, 90d supply, fill #0
  Filled 2024-07-16: qty 90, 90d supply, fill #1

## 2024-03-30 MED ORDER — METOPROLOL SUCCINATE ER 25 MG PO TB24
25.0000 mg | ORAL_TABLET | Freq: Every day | ORAL | 1 refills | Status: DC
  Filled 2024-03-30: qty 90, 90d supply, fill #0
  Filled 2024-07-06: qty 90, 90d supply, fill #1

## 2024-03-30 NOTE — Assessment & Plan Note (Signed)
 Well controlled, no changes to meds. Encouraged heart healthy diet such as the DASH diet and exercise as tolerated.

## 2024-03-30 NOTE — Assessment & Plan Note (Signed)
 Tolerating statin, encouraged heart healthy diet, avoid trans fats, minimize simple carbs and saturated fats. Increase exercise as tolerated

## 2024-03-30 NOTE — Progress Notes (Signed)
 Established Patient Office Visit  Subjective   Patient ID: Bobby Castaneda., male    DOB: Jul 31, 1966  Age: 58 y.o. MRN: [id number]  Chief Complaint  Patient presents with   Annual Exam    Pt states not fasting.     HPI Discussed the use of AI scribe software for clinical note transcription with the patient, who gave verbal consent to proceed.  History of Present Illness Bobby Castaneda. is a 58 year old male who presents for an annual physical exam.  His blood pressure has been well-controlled, although it was noted to be low during a recent check. He is currently on metoprolol , hydrochlorothiazide , Lipitor, and amlodipine . He needs refills for some medications, specifically mentioning that hydrochlorothiazide  was last filled in April and amlodipine  was only filled for a month.  He maintains a regular exercise routine and plans to play basketball after the visit. No issues with stomach, joints, or moles. He confirms that there have been no new procedures or changes in his family history.   Patient Active Problem List   Diagnosis Date Noted   Stress fracture of right tibia 05/07/2022   Abscess and cellulitis of gluteal region 01/16/2022   Chest pain 06/23/2020   Erectile dysfunction 10/26/2019   Preventative health care 12/23/2016   Angioedema 07/02/2016   Chronic kidney disease (CKD), stage I 02/08/2016   Injury of left elbow 07/06/2015   Allergic rhinitis 03/10/2015   Left thigh pain 02/10/2015   Obesity (BMI 30-39.9) 02/23/2014   Facial palsy 01/01/2014   Bell's palsy 05/19/2013   OSA (obstructive sleep apnea) 11/13/2011   Enlarged thyroid  12/29/2010   Closed fracture of metacarpal bone 12/15/2010   SINUSITIS - ACUTE-NOS 09/04/2010   Acute pain of right knee 12/05/2009   SHOULDER PAIN, RIGHT 10/25/2009   CELLULITIS AND ABSCESS OF TRUNK 06/22/2008   Hyperlipidemia 03/22/2008   ABSCESS, SKIN 06/18/2007   Essential hypertension 02/25/2007   Past Medical History:   Diagnosis Date   Allergy    Bell's palsy    History   Hyperlipidemia    Hypertension    Sleep apnea    uses CPAP nightly   Past Surgical History:  Procedure Laterality Date   ADENOIDECTOMY  08/2011   Lazaro--- gso ent   CLOSED REDUCTION HAND FRACTURE  12/2010   Right hand   COLONOSCOPY     KNEE SURGERY     bilat scops   POLYPECTOMY     Social History   Tobacco Use   Smoking status: Never   Smokeless tobacco: Never  Vaping Use   Vaping status: Never Used  Substance Use Topics   Alcohol use: No    Alcohol/week: 0.0 standard drinks of alcohol   Drug use: No   Social History   Socioeconomic History   Marital status: Married    Spouse name: Not on file   Number of children: Y   Years of education: Not on file   Highest education level: Bachelor's degree (e.g., BA, AB, BS)  Occupational History   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, engineering  Tobacco Use   Smoking status: Never   Smokeless tobacco: Never  Vaping Use   Vaping status: Never Used  Substance and Sexual Activity   Alcohol use: No    Alcohol/week: 0.0 standard drinks of alcohol   Drug use: No   Sexual activity: Yes    Partners: Male  Other Topics Concern   Not on  file  Social History Narrative   Regular Exercise- qd    Social Drivers of Health   Financial Resource Strain: Low Risk  (11/25/2023)   Overall Financial Resource Strain (CARDIA)    Difficulty of Paying Living Expenses: Not very hard  Food Insecurity: No Food Insecurity (11/25/2023)   Hunger Vital Sign    Worried About Running Out of Food in the Last Year: Never true    Ran Out of Food in the Last Year: Never true  Transportation Needs: No Transportation Needs (11/25/2023)   PRAPARE - Administrator, Civil Service (Medical): No    Lack of Transportation (Non-Medical): No  Physical Activity: Sufficiently Active (11/25/2023)   Exercise Vital Sign    Days of Exercise per Week: 6  days    Minutes of Exercise per Session: 60 min  Stress: No Stress Concern Present (11/25/2023)   [employer] of Occupational Health - Occupational Stress Questionnaire    Feeling of Stress : Not at all  Social Connections: Moderately Integrated (11/25/2023)   Social Connection and Isolation Panel    Frequency of Communication with Friends and Family: More than three times a week    Frequency of Social Gatherings with Friends and Family: Once a week    Attends Religious Services: More than 4 times per year    Active Member of [employer] or Organizations: Yes    Attends Banker Meetings: More than 4 times per year    Marital Status: Widowed  Catering manager Violence: Not on file   Family Status  Relation Name Status   Mother  Alive   Father  Deceased   Sister  Deceased   Mat Aunt  Deceased at age 82   Mat Uncle  Deceased   Mat Uncle  Deceased   MGM  Deceased   MGF  Deceased at age 63   Other  (Not Specified)   Neg Hx  (Not Specified)  No partnership data on file   Family History  Problem Relation Age of Onset   Hypertension Mother    Cancer Mother 67       breast   Breast cancer Mother    Colon polyps Father    Hypertension Father    Hyperlipidemia Father    Stroke Father    Cancer Father        prostate   Colon cancer Father        dx'd in his 70's   Hypertension Sister    Brain cancer Sister    Cancer Maternal Aunt 37       breast   Breast cancer Maternal Aunt    Heart disease Maternal Uncle        mi   Hypertension Maternal Uncle    Stroke Maternal Uncle    Hypertension Maternal Uncle    Hypertension Maternal Grandmother    Alzheimer's disease Maternal Grandmother    Heart disease Maternal Grandfather        mi   Hypertension Maternal Grandfather    Hyperlipidemia Maternal Grandfather    Stroke Maternal Grandfather    Hypertension Other    Rectal cancer Neg Hx    Stomach cancer Neg Hx    Esophageal cancer Neg Hx    Allergies  Allergen  Reactions   Ace Inhibitors Other (See Comments)    angioedema      Review of Systems  Constitutional:  Negative for chills, fever and malaise/fatigue.  HENT:  Negative for congestion and hearing loss.  Eyes:  Negative for blurred vision and discharge.  Respiratory:  Negative for cough, sputum production and shortness of breath.   Cardiovascular:  Negative for chest pain, palpitations and leg swelling.  Gastrointestinal:  Negative for abdominal pain, blood in stool, constipation, diarrhea, heartburn, nausea and vomiting.  Genitourinary:  Negative for dysuria, frequency, hematuria and urgency.  Musculoskeletal:  Negative for back pain, falls and myalgias.  Skin:  Negative for rash.  Neurological:  Negative for dizziness, sensory change, loss of consciousness, weakness and headaches.  Endo/Heme/Allergies:  Negative for environmental allergies. Does not bruise/bleed easily.  Psychiatric/Behavioral:  Negative for depression and suicidal ideas. The patient is not nervous/anxious and does not have insomnia.       Objective:     BP 124/80 (BP Location: Left Arm, Patient Position: Sitting)   Pulse 62   Temp 98 F (36.7 C) (Oral)   Resp 18   Ht 6' 1 (1.854 m)   Wt 233 lb 6.4 oz (105.9 kg)   SpO2 96%   BMI 30.79 kg/m  BP Readings from Last 3 Encounters:  03/30/24 124/80  02/04/24 (!) 149/80  12/17/23 (!) 216/113   Wt Readings from Last 3 Encounters:  03/30/24 233 lb 6.4 oz (105.9 kg)  02/04/24 231 lb 3.2 oz (104.9 kg)  12/17/23 230 lb (104.3 kg)   SpO2 Readings from Last 3 Encounters:  03/30/24 96%  02/04/24 96%  12/17/23 98%      Physical Exam Vitals and nursing note reviewed.  Constitutional:      General: He is not in acute distress.    Appearance: Normal appearance. He is well-developed.  HENT:     Head: Normocephalic and atraumatic.     Right Ear: Tympanic membrane, ear canal and external ear normal. There is no impacted cerumen.     Left Ear: Tympanic  membrane, ear canal and external ear normal. There is no impacted cerumen.     Nose: Nose normal.     Mouth/Throat:     Mouth: Mucous membranes are moist.     Pharynx: Oropharynx is clear. No oropharyngeal exudate or posterior oropharyngeal erythema.   Eyes:     General: No scleral icterus.       Right eye: No discharge.        Left eye: No discharge.     Conjunctiva/sclera: Conjunctivae normal.     Pupils: Pupils are equal, round, and reactive to light.   Neck:     Thyroid : No thyromegaly.     Vascular: No JVD.   Cardiovascular:     Rate and Rhythm: Normal rate and regular rhythm.     Heart sounds: Normal heart sounds. No murmur heard. Pulmonary:     Effort: Pulmonary effort is normal. No respiratory distress.     Breath sounds: Normal breath sounds.  Abdominal:     General: Bowel sounds are normal. There is no distension.     Palpations: Abdomen is soft. There is no mass.     Tenderness: There is no abdominal tenderness. There is no guarding or rebound.   Musculoskeletal:        General: Normal range of motion.     Cervical back: Normal range of motion and neck supple.     Right lower leg: No edema.     Left lower leg: No edema.  Lymphadenopathy:     Cervical: No cervical adenopathy.   Skin:    General: Skin is warm and dry.     Findings: No erythema  or rash.   Neurological:     Mental Status: He is alert and oriented to person, place, and time.     Cranial Nerves: No cranial nerve deficit.     Motor: No abnormal muscle tone.     Deep Tendon Reflexes: Reflexes are normal and symmetric. Reflexes normal.   Psychiatric:        Mood and Affect: Mood normal.        Behavior: Behavior normal.        Thought Content: Thought content normal.        Judgment: Judgment normal.      No results found for any visits on 03/30/24.  Last CBC Lab Results  Component Value Date   WBC 3.6 (L) 12/17/2023   HGB 14.4 12/17/2023   HCT 43.9 12/17/2023   MCV 88.0 12/17/2023    MCH 28.9 12/17/2023   RDW 12.9 12/17/2023   PLT 236 12/17/2023   Last metabolic panel Lab Results  Component Value Date   GLUCOSE 134 (H) 12/17/2023   NA 136 12/17/2023   K 3.4 (L) 12/17/2023   CL 100 12/17/2023   CO2 27 12/17/2023   BUN 12 12/17/2023   CREATININE 1.36 (H) 12/17/2023   GFRNONAA 60 (L) 12/17/2023   CALCIUM  9.6 12/17/2023   PHOS 3.3 02/09/2016   PROT 8.4 (H) 12/17/2023   ALBUMIN 4.0 12/17/2023   BILITOT 0.8 12/17/2023   ALKPHOS 83 12/17/2023   AST 24 12/17/2023   ALT 20 12/17/2023   ANIONGAP 9 12/17/2023   Last lipids Lab Results  Component Value Date   CHOL 143 08/06/2023   HDL 43.00 08/06/2023   LDLCALC 82 08/06/2023   TRIG 92.0 08/06/2023   CHOLHDL 3 08/06/2023   Last hemoglobin A1c No results found for: HGBA1C Last thyroid  functions Lab Results  Component Value Date   TSH 2.13 08/06/2023   Last vitamin D No results found for: 25OHVITD2, 25OHVITD3, VD25OH Last vitamin B12 and Folate No results found for: VITAMINB12, FOLATE    The 10-year ASCVD risk score (Arnett DK, et al., 2019) is: 11.6%    Assessment & Plan:   Problem List Items Addressed This Visit       Unprioritized   Erectile dysfunction   Relevant Orders   PSA   Chronic kidney disease (CKD), stage I   Relevant Orders   Comprehensive metabolic panel with GFR   Preventative health care - Primary   Ghm utd Check labs  See AVS  Health Maintenance  Topic Date Due   COVID-19 Vaccine (4 - 2024-25 season) 06/09/2023   INFLUENZA VACCINE  05/08/2024   DTaP/Tdap/Td (3 - Td or Tdap) 07/18/2025   Colonoscopy  11/14/2027   Hepatitis C Screening  Completed   HIV Screening  Completed   Zoster Vaccines- Shingrix  Completed   HPV VACCINES  Aged Out   Meningococcal B Vaccine  Aged Out         Relevant Orders   Lipid panel   PSA   TSH   Comprehensive metabolic panel with GFR   CBC with Differential/Platelet   Hyperlipidemia   Tolerating statin, encouraged heart  healthy diet, avoid trans fats, minimize simple carbs and saturated fats. Increase exercise as tolerated       Relevant Medications   amLODipine  (NORVASC ) 10 MG tablet   atorvastatin  (LIPITOR) 40 MG tablet   hydrochlorothiazide  (HYDRODIURIL ) 25 MG tablet   metoprolol  succinate (TOPROL -XL) 25 MG 24 hr tablet   Other Relevant Orders   Lipid  panel   PSA   TSH   Comprehensive metabolic panel with GFR   CBC with Differential/Platelet   Essential hypertension   Well controlled, no changes to meds. Encouraged heart healthy diet such as the DASH diet and exercise as tolerated.        Relevant Medications   amLODipine  (NORVASC ) 10 MG tablet   atorvastatin  (LIPITOR) 40 MG tablet   hydrochlorothiazide  (HYDRODIURIL ) 25 MG tablet   metoprolol  succinate (TOPROL -XL) 25 MG 24 hr tablet   Other Relevant Orders   Lipid panel   PSA   TSH   Comprehensive metabolic panel with GFR   CBC with Differential/Platelet    Return in about 6 months (around 09/29/2024).    Jamesina Gaugh R Lowne Chase, DO

## 2024-03-30 NOTE — Assessment & Plan Note (Signed)
 Ghm utd Check labs  See AVS  Health Maintenance  Topic Date Due   COVID-19 Vaccine (4 - 2024-25 season) 06/09/2023   INFLUENZA VACCINE  05/08/2024   DTaP/Tdap/Td (3 - Td or Tdap) 07/18/2025   Colonoscopy  11/14/2027   Hepatitis C Screening  Completed   HIV Screening  Completed   Zoster Vaccines- Shingrix  Completed   HPV VACCINES  Aged Out   Meningococcal B Vaccine  Aged Out

## 2024-03-30 NOTE — Patient Instructions (Signed)

## 2024-04-05 ENCOUNTER — Ambulatory Visit: Payer: Self-pay | Admitting: Family Medicine

## 2024-04-17 ENCOUNTER — Other Ambulatory Visit (HOSPITAL_COMMUNITY): Payer: Self-pay

## 2024-04-17 MED ORDER — POTASSIUM CHLORIDE ER 20 MEQ PO TBCR
40.0000 meq | EXTENDED_RELEASE_TABLET | Freq: Every day | ORAL | 2 refills | Status: DC
  Filled 2024-04-17: qty 180, 90d supply, fill #0

## 2024-04-20 ENCOUNTER — Other Ambulatory Visit (HOSPITAL_COMMUNITY): Payer: Self-pay

## 2024-04-20 ENCOUNTER — Other Ambulatory Visit: Payer: Self-pay | Admitting: Family Medicine

## 2024-04-20 DIAGNOSIS — I10 Essential (primary) hypertension: Secondary | ICD-10-CM

## 2024-04-20 MED ORDER — POTASSIUM CHLORIDE CRYS ER 20 MEQ PO TBCR
60.0000 meq | EXTENDED_RELEASE_TABLET | Freq: Every day | ORAL | 0 refills | Status: DC
  Filled 2024-04-20: qty 90, 30d supply, fill #0
  Filled 2024-04-21: qty 211, 71d supply, fill #0
  Filled 2024-04-21: qty 59, 19d supply, fill #0

## 2024-04-21 ENCOUNTER — Other Ambulatory Visit (HOSPITAL_COMMUNITY): Payer: Self-pay

## 2024-05-18 ENCOUNTER — Other Ambulatory Visit (HOSPITAL_COMMUNITY): Payer: Self-pay

## 2024-07-03 ENCOUNTER — Other Ambulatory Visit: Payer: Self-pay | Admitting: Family Medicine

## 2024-07-03 ENCOUNTER — Telehealth: Payer: Self-pay | Admitting: Neurology

## 2024-07-03 ENCOUNTER — Other Ambulatory Visit (HOSPITAL_COMMUNITY): Payer: Self-pay

## 2024-07-03 DIAGNOSIS — N529 Male erectile dysfunction, unspecified: Secondary | ICD-10-CM

## 2024-07-03 MED ORDER — TADALAFIL 20 MG PO TABS
10.0000 mg | ORAL_TABLET | ORAL | 11 refills | Status: AC | PRN
  Filled 2024-07-03: qty 10, 20d supply, fill #0
  Filled 2024-09-01: qty 10, 20d supply, fill #1
  Filled 2024-11-07: qty 10, 20d supply, fill #2

## 2024-07-03 NOTE — Telephone Encounter (Signed)
 Copied from CRM [id number]. Topic: Clinical - Medication Question >> Jul 03, 2024  1:56 PM Lauren C wrote: Reason for CRM: Pt is wondering if he can have his Viagra  100mg  tablets switched over to Cialis  20mg .  He wants this sent to [facility] [address] [place] [address] Phone: [phone] Fax: [phone]

## 2024-07-08 ENCOUNTER — Telehealth: Payer: Self-pay

## 2024-07-08 ENCOUNTER — Other Ambulatory Visit: Payer: Self-pay

## 2024-07-08 DIAGNOSIS — G4733 Obstructive sleep apnea (adult) (pediatric): Secondary | ICD-10-CM

## 2024-07-08 NOTE — Progress Notes (Signed)
 This encounter was created in error - please disregard.

## 2024-07-08 NOTE — Telephone Encounter (Signed)
 Copied from CRM #[id number]. Topic: Clinical - Order For Equipment >> Jul 06, 2024 12:24 PM Rozanna MATSU wrote: Reason for CRM: Pt calling stating he is needing supplies for his CPAP machine. He is really not clear on what he is requesting. I think it maybe an order. He really is not sure just knows he needs supplies. [employer] is the supplier for his machine.    Spoke w/ PT [facility].  Order placed    -NFN

## 2024-07-16 ENCOUNTER — Other Ambulatory Visit: Payer: Self-pay

## 2024-07-16 ENCOUNTER — Other Ambulatory Visit: Payer: Self-pay | Admitting: Family Medicine

## 2024-07-16 ENCOUNTER — Other Ambulatory Visit (HOSPITAL_COMMUNITY): Payer: Self-pay

## 2024-07-16 DIAGNOSIS — I10 Essential (primary) hypertension: Secondary | ICD-10-CM

## 2024-07-16 MED ORDER — POTASSIUM CHLORIDE CRYS ER 20 MEQ PO TBCR
60.0000 meq | EXTENDED_RELEASE_TABLET | Freq: Every day | ORAL | 0 refills | Status: DC
  Filled 2024-07-16: qty 270, 90d supply, fill #0

## 2024-09-01 ENCOUNTER — Other Ambulatory Visit (HOSPITAL_COMMUNITY): Payer: Self-pay

## 2024-09-29 ENCOUNTER — Other Ambulatory Visit: Payer: Self-pay | Admitting: Family Medicine

## 2024-09-29 ENCOUNTER — Other Ambulatory Visit: Payer: Self-pay

## 2024-09-29 ENCOUNTER — Other Ambulatory Visit (HOSPITAL_COMMUNITY): Payer: Self-pay

## 2024-09-29 DIAGNOSIS — I10 Essential (primary) hypertension: Secondary | ICD-10-CM

## 2024-09-29 DIAGNOSIS — E785 Hyperlipidemia, unspecified: Secondary | ICD-10-CM

## 2024-09-29 MED ORDER — ATORVASTATIN CALCIUM 40 MG PO TABS
40.0000 mg | ORAL_TABLET | Freq: Every day | ORAL | 0 refills | Status: DC
  Filled 2024-09-29: qty 30, 30d supply, fill #0

## 2024-09-29 MED ORDER — METOPROLOL SUCCINATE ER 25 MG PO TB24
25.0000 mg | ORAL_TABLET | Freq: Every day | ORAL | 0 refills | Status: DC
  Filled 2024-09-29: qty 30, 30d supply, fill #0

## 2024-09-29 MED ORDER — HYDROCHLOROTHIAZIDE 25 MG PO TABS
25.0000 mg | ORAL_TABLET | Freq: Every day | ORAL | 0 refills | Status: DC
  Filled 2024-09-29: qty 30, 30d supply, fill #0

## 2024-10-02 ENCOUNTER — Ambulatory Visit: Admitting: Family Medicine

## 2024-10-17 ENCOUNTER — Other Ambulatory Visit: Payer: Self-pay | Admitting: Family Medicine

## 2024-10-17 DIAGNOSIS — I10 Essential (primary) hypertension: Secondary | ICD-10-CM

## 2024-10-19 ENCOUNTER — Other Ambulatory Visit (HOSPITAL_COMMUNITY): Payer: Self-pay

## 2024-10-19 MED ORDER — POTASSIUM CHLORIDE CRYS ER 20 MEQ PO TBCR
60.0000 meq | EXTENDED_RELEASE_TABLET | Freq: Every day | ORAL | 0 refills | Status: AC
  Filled 2024-10-19: qty 270, 90d supply, fill #0

## 2024-10-20 ENCOUNTER — Other Ambulatory Visit (HOSPITAL_COMMUNITY): Payer: Self-pay

## 2024-10-29 ENCOUNTER — Other Ambulatory Visit: Payer: Self-pay | Admitting: Family Medicine

## 2024-10-29 DIAGNOSIS — I10 Essential (primary) hypertension: Secondary | ICD-10-CM

## 2024-10-30 ENCOUNTER — Other Ambulatory Visit (HOSPITAL_COMMUNITY): Payer: Self-pay

## 2024-10-30 MED ORDER — AMLODIPINE BESYLATE 10 MG PO TABS
10.0000 mg | ORAL_TABLET | Freq: Every day | ORAL | 0 refills | Status: AC
  Filled 2024-10-30: qty 30, 30d supply, fill #0

## 2024-10-31 ENCOUNTER — Other Ambulatory Visit: Payer: Self-pay | Admitting: Family Medicine

## 2024-10-31 DIAGNOSIS — I10 Essential (primary) hypertension: Secondary | ICD-10-CM

## 2024-11-02 ENCOUNTER — Other Ambulatory Visit: Payer: Self-pay

## 2024-11-02 ENCOUNTER — Other Ambulatory Visit (HOSPITAL_COMMUNITY): Payer: Self-pay

## 2024-11-02 MED ORDER — HYDROCHLOROTHIAZIDE 25 MG PO TABS
25.0000 mg | ORAL_TABLET | Freq: Every day | ORAL | 0 refills | Status: AC
  Filled 2024-11-02: qty 30, 30d supply, fill #0

## 2024-11-02 MED ORDER — METOPROLOL SUCCINATE ER 25 MG PO TB24
25.0000 mg | ORAL_TABLET | Freq: Every day | ORAL | 0 refills | Status: AC
  Filled 2024-11-02: qty 30, 30d supply, fill #0

## 2024-11-08 ENCOUNTER — Other Ambulatory Visit (HOSPITAL_COMMUNITY): Payer: Self-pay

## 2024-11-09 ENCOUNTER — Other Ambulatory Visit: Payer: Self-pay | Admitting: Family Medicine

## 2024-11-09 ENCOUNTER — Other Ambulatory Visit (HOSPITAL_COMMUNITY): Payer: Self-pay

## 2024-11-09 DIAGNOSIS — E785 Hyperlipidemia, unspecified: Secondary | ICD-10-CM

## 2024-11-09 MED ORDER — ATORVASTATIN CALCIUM 40 MG PO TABS
40.0000 mg | ORAL_TABLET | Freq: Every day | ORAL | 0 refills | Status: AC
  Filled 2024-11-09: qty 30, 30d supply, fill #0
